# Patient Record
Sex: Female | Born: 1965
Health system: Southern US, Community
[De-identification: ages and names within clinical notes are randomized; demographics above are authoritative.]

## PROBLEM LIST (undated history)

## (undated) DIAGNOSIS — I10 Essential (primary) hypertension: Secondary | ICD-10-CM

## (undated) DIAGNOSIS — R42 Dizziness and giddiness: Secondary | ICD-10-CM

## (undated) DIAGNOSIS — F319 Bipolar disorder, unspecified: Secondary | ICD-10-CM

## (undated) DIAGNOSIS — M5440 Lumbago with sciatica, unspecified side: Secondary | ICD-10-CM

## (undated) DIAGNOSIS — M545 Low back pain, unspecified: Secondary | ICD-10-CM

## (undated) DIAGNOSIS — E876 Hypokalemia: Secondary | ICD-10-CM

## (undated) DIAGNOSIS — F419 Anxiety disorder, unspecified: Secondary | ICD-10-CM

## (undated) DIAGNOSIS — Z5189 Encounter for other specified aftercare: Secondary | ICD-10-CM

## (undated) DIAGNOSIS — M1990 Unspecified osteoarthritis, unspecified site: Secondary | ICD-10-CM

## (undated) HISTORY — DX: Bipolar disorder, unspecified: F31.9

## (undated) HISTORY — DX: Dizziness and giddiness: R42

## (undated) HISTORY — PX: ABDOMINAL HYSTERECTOMY: SHX81

## (undated) HISTORY — DX: Hypokalemia: E87.6

## (undated) HISTORY — DX: Essential (primary) hypertension: I10

## (undated) HISTORY — DX: Encounter for other specified aftercare: Z51.89

## (undated) HISTORY — DX: Low back pain, unspecified: M54.50

## (undated) HISTORY — DX: Lumbago with sciatica, unspecified side: M54.40

---

## 1998-03-28 ENCOUNTER — Inpatient Hospital Stay (HOSPITAL_COMMUNITY): Admission: AD | Admit: 1998-03-28 | Discharge: 1998-03-28 | Payer: Self-pay | Admitting: *Deleted

## 1999-11-27 ENCOUNTER — Inpatient Hospital Stay (HOSPITAL_COMMUNITY): Admission: RE | Admit: 1999-11-27 | Discharge: 1999-12-01 | Payer: Self-pay | Admitting: *Deleted

## 1999-11-27 ENCOUNTER — Encounter (INDEPENDENT_AMBULATORY_CARE_PROVIDER_SITE_OTHER): Payer: Self-pay

## 2000-06-16 ENCOUNTER — Emergency Department (HOSPITAL_COMMUNITY): Admission: EM | Admit: 2000-06-16 | Discharge: 2000-06-16 | Payer: Self-pay | Admitting: Emergency Medicine

## 2000-06-16 ENCOUNTER — Encounter: Payer: Self-pay | Admitting: Emergency Medicine

## 2001-06-24 ENCOUNTER — Other Ambulatory Visit: Admission: RE | Admit: 2001-06-24 | Discharge: 2001-06-24 | Payer: Self-pay | Admitting: Obstetrics & Gynecology

## 2008-08-03 ENCOUNTER — Observation Stay (HOSPITAL_COMMUNITY): Admission: AD | Admit: 2008-08-03 | Discharge: 2008-08-04 | Payer: Self-pay | Admitting: Family Medicine

## 2008-08-03 ENCOUNTER — Ambulatory Visit: Payer: Self-pay | Admitting: Family Medicine

## 2008-08-20 ENCOUNTER — Observation Stay (HOSPITAL_COMMUNITY): Admission: AD | Admit: 2008-08-20 | Discharge: 2008-08-22 | Payer: Self-pay | Admitting: Obstetrics and Gynecology

## 2008-09-05 ENCOUNTER — Inpatient Hospital Stay (HOSPITAL_COMMUNITY): Admission: RE | Admit: 2008-09-05 | Discharge: 2008-09-07 | Payer: Self-pay | Admitting: Obstetrics & Gynecology

## 2008-09-05 ENCOUNTER — Encounter (INDEPENDENT_AMBULATORY_CARE_PROVIDER_SITE_OTHER): Payer: Self-pay | Admitting: Obstetrics & Gynecology

## 2008-09-10 ENCOUNTER — Inpatient Hospital Stay (HOSPITAL_COMMUNITY): Admission: AD | Admit: 2008-09-10 | Discharge: 2008-09-14 | Payer: Self-pay | Admitting: Obstetrics and Gynecology

## 2008-09-12 ENCOUNTER — Encounter: Payer: Self-pay | Admitting: Obstetrics and Gynecology

## 2009-04-14 ENCOUNTER — Encounter: Admission: RE | Admit: 2009-04-14 | Discharge: 2009-04-14 | Payer: Self-pay | Admitting: Family Medicine

## 2009-05-10 ENCOUNTER — Encounter: Admission: RE | Admit: 2009-05-10 | Discharge: 2009-05-10 | Payer: Self-pay | Admitting: Family Medicine

## 2009-05-27 HISTORY — PX: EXPLORATORY LAPAROTOMY: SUR591

## 2010-04-12 IMAGING — CR DG LUMBAR SPINE COMPLETE 4+V
5 series · 5 of 5 positions shown · non-contrast
Comparison: CT 09/10/2008

CLINICAL DATA: Chronic back pain

LUMBAR SPINE - COMPLETE 4+ VIEW

[view not recorded (1 of 5)]
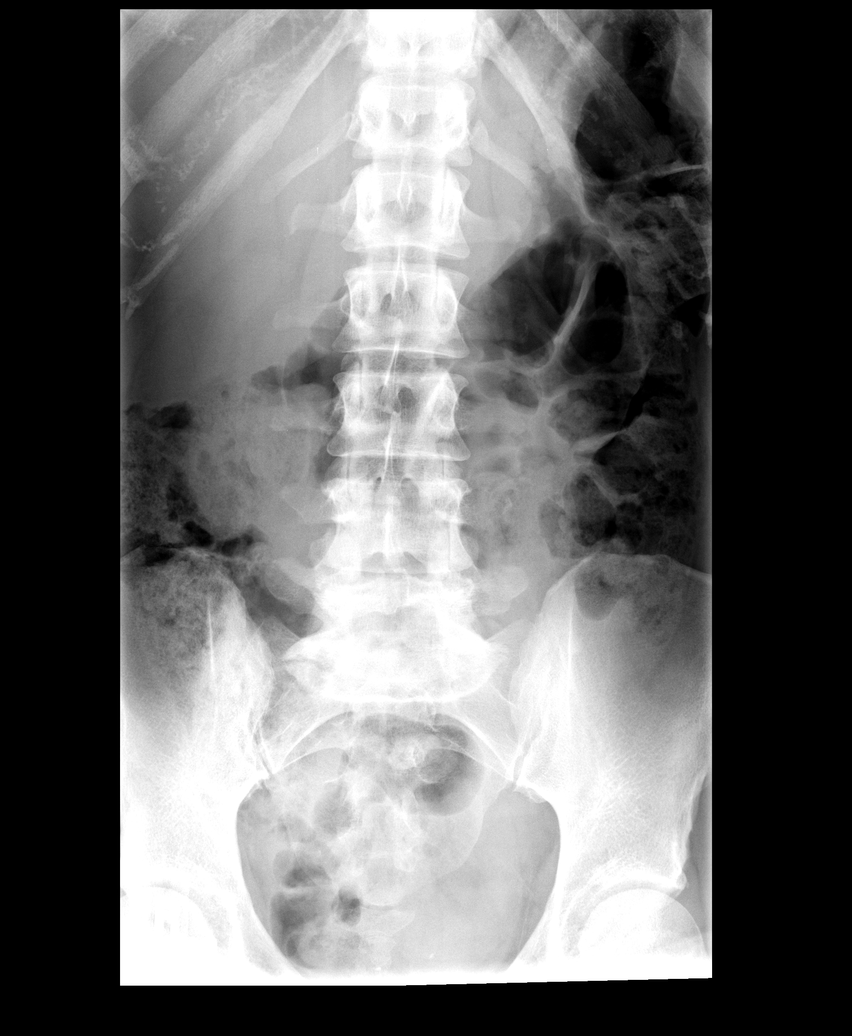

[view not recorded (2 of 5)]
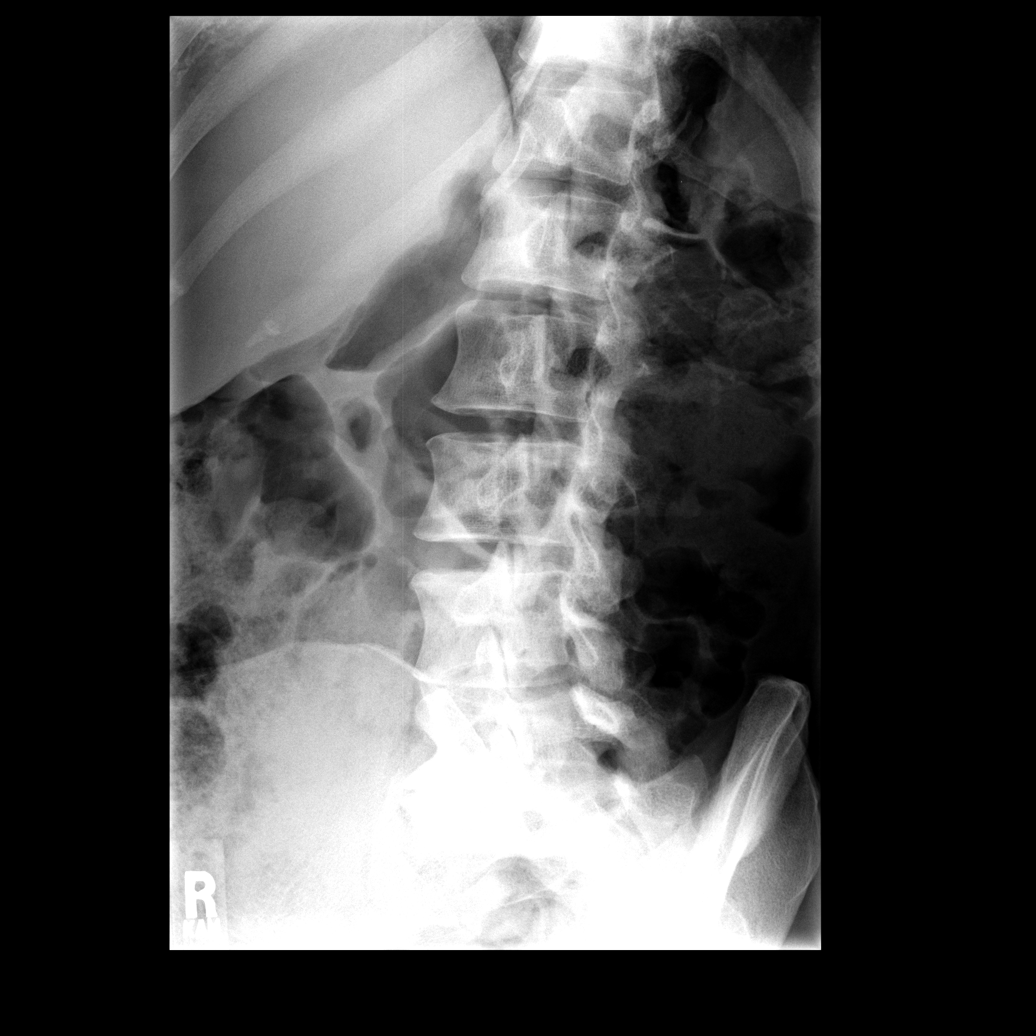

[view not recorded (3 of 5)]
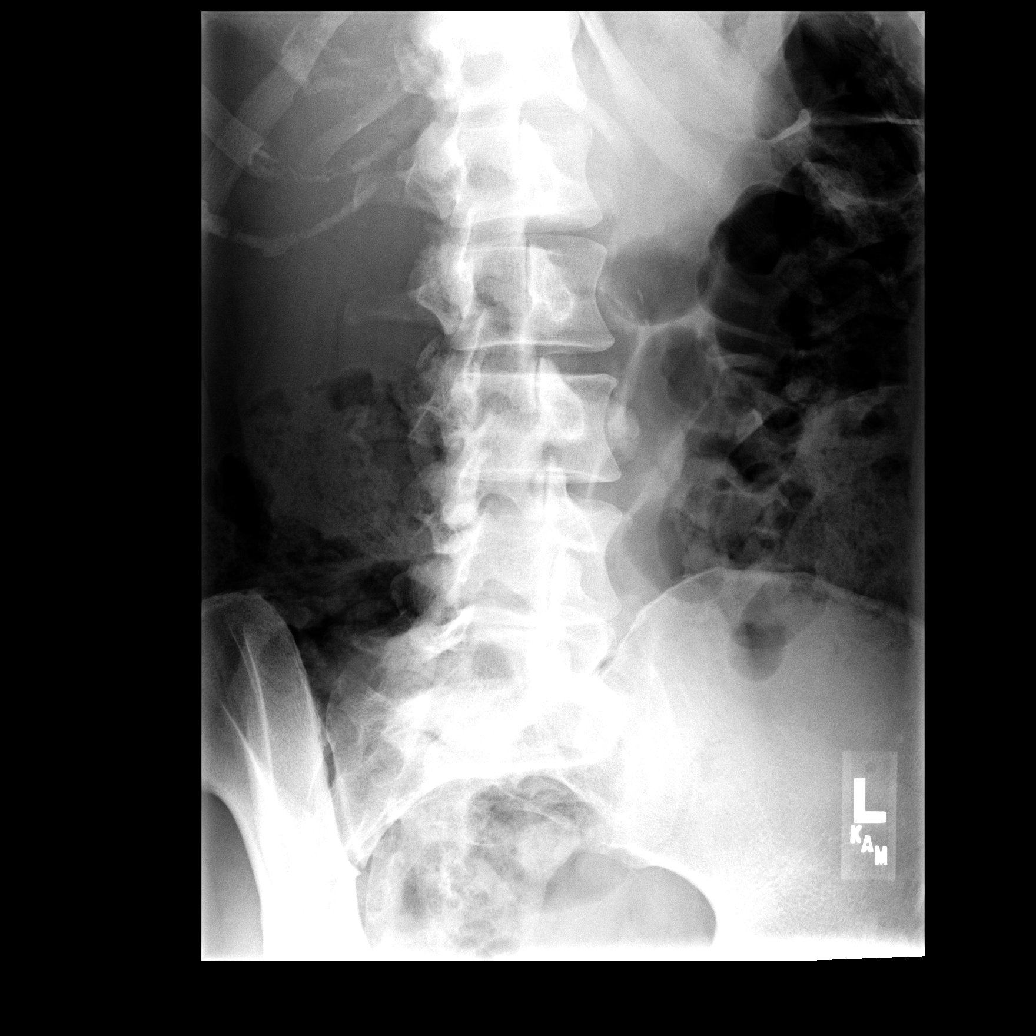

[view not recorded (4 of 5)]
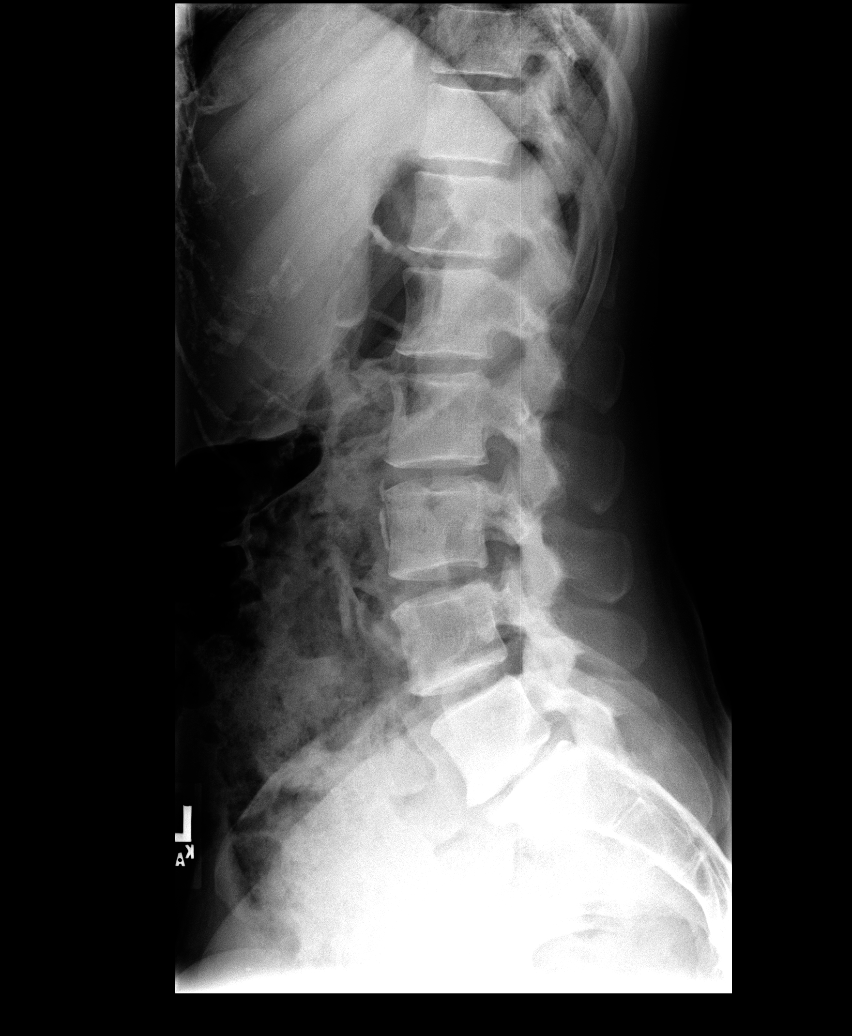

[view not recorded (5 of 5)]
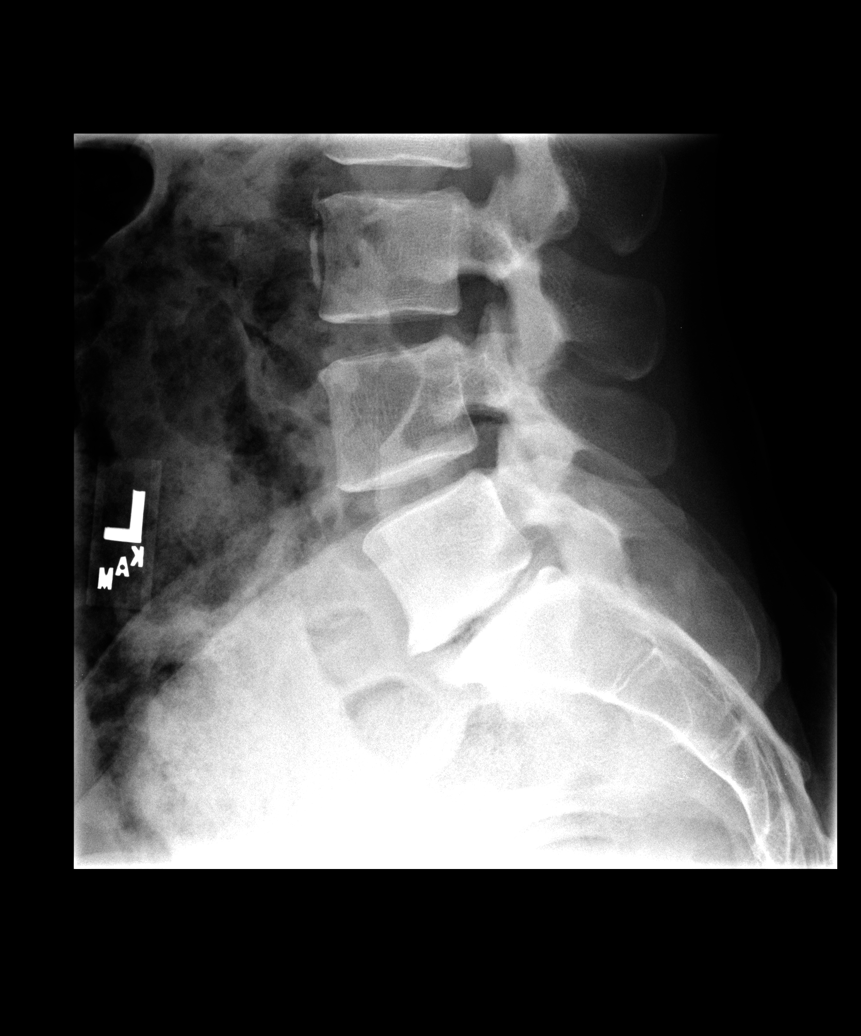

[5 of 5 positions shown; findings below may reference images not displayed]

FINDINGS: Five lumbar-type vertebral body.  Lateral projection
demonstrat mild loss of disc height at L5-S1 with associated
endplate sclerosis and osteophytosis.  No evidence of subluxation.
Oblique projection demonstrate no evidence of pars fracture.
IMPRESSION: 1.  No acute findings of the lumbar spine.
2.  Mild disc osteophytic disease at L5-S1.

## 2010-09-05 LAB — COMPREHENSIVE METABOLIC PANEL
ALT: 16 U/L (ref 0–35)
AST: 26 U/L (ref 0–37)
Albumin: 2.1 g/dL — ABNORMAL LOW (ref 3.5–5.2)
Alkaline Phosphatase: 77 U/L (ref 39–117)
BUN: 5 mg/dL — ABNORMAL LOW (ref 6–23)
CO2: 22 mEq/L (ref 19–32)
Calcium: 7.9 mg/dL — ABNORMAL LOW (ref 8.4–10.5)
Chloride: 101 mEq/L (ref 96–112)
Creatinine, Ser: 0.96 mg/dL (ref 0.4–1.2)
GFR calc Af Amer: >60 mL/min (ref >60)
GFR calc non Af Amer: >60 mL/min (ref >60)
Glucose, Bld: 214 mg/dL — ABNORMAL HIGH (ref 70–99)
Potassium: 2.9 mEq/L — ABNORMAL LOW (ref 3.5–5.1)
Sodium: 133 mEq/L — ABNORMAL LOW (ref 135–145)
Total Bilirubin: 0.8 mg/dL (ref 0.3–1.2)
Total Protein: 5.4 g/dL — ABNORMAL LOW (ref 6.0–8.3)

## 2010-09-05 LAB — BASIC METABOLIC PANEL
BUN: 3 mg/dL — ABNORMAL LOW (ref 6–23)
BUN: 4 mg/dL — ABNORMAL LOW (ref 6–23)
CO2: 28 mEq/L (ref 19–32)
CO2: 29 mEq/L (ref 19–32)
Calcium: 7.9 mg/dL — ABNORMAL LOW (ref 8.4–10.5)
Calcium: 8.3 mg/dL — ABNORMAL LOW (ref 8.4–10.5)
Chloride: 101 mEq/L (ref 96–112)
Chloride: 103 mEq/L (ref 96–112)
Creatinine, Ser: 0.68 mg/dL (ref 0.4–1.2)
Creatinine, Ser: 0.71 mg/dL (ref 0.4–1.2)
GFR calc Af Amer: >60 mL/min (ref >60)
GFR calc Af Amer: >60 mL/min (ref >60)
GFR calc non Af Amer: >60 mL/min (ref >60)
GFR calc non Af Amer: >60 mL/min (ref >60)
Glucose, Bld: 135 mg/dL — ABNORMAL HIGH (ref 70–99)
Glucose, Bld: 160 mg/dL — ABNORMAL HIGH (ref 70–99)
Potassium: 2.9 mEq/L — ABNORMAL LOW (ref 3.5–5.1)
Potassium: 3.3 mEq/L — ABNORMAL LOW (ref 3.5–5.1)
Sodium: 135 mEq/L (ref 135–145)
Sodium: 138 mEq/L (ref 135–145)

## 2010-09-05 LAB — CBC
HCT: 18.9 % — ABNORMAL LOW (ref 36.0–46.0)
HCT: 20.6 % — ABNORMAL LOW (ref 36.0–46.0)
HCT: 21.3 % — ABNORMAL LOW (ref 36.0–46.0)
HCT: 23.2 % — ABNORMAL LOW (ref 36.0–46.0)
HCT: 23.8 % — ABNORMAL LOW (ref 36.0–46.0)
HCT: 18.5 % — ABNORMAL LOW (ref 36.0–46.0)
HCT: 20.5 % — ABNORMAL LOW (ref 36.0–46.0)
HCT: 29.9 % — ABNORMAL LOW (ref 36.0–46.0)
Hemoglobin: 6.1 g/dL — CL (ref 12.0–15.0)
Hemoglobin: 6.7 g/dL — CL (ref 12.0–15.0)
Hemoglobin: 6.7 g/dL — CL (ref 12.0–15.0)
Hemoglobin: 7.5 g/dL — CL (ref 12.0–15.0)
Hemoglobin: 7.5 g/dL — CL (ref 12.0–15.0)
Hemoglobin: 5.9 g/dL — CL (ref 12.0–15.0)
Hemoglobin: 6.6 g/dL — CL (ref 12.0–15.0)
Hemoglobin: 9.2 g/dL — ABNORMAL LOW (ref 12.0–15.0)
MCHC: 31.5 g/dL (ref 30.0–36.0)
MCHC: 31.5 g/dL (ref 30.0–36.0)
MCHC: 32.1 g/dL (ref 30.0–36.0)
MCHC: 32.4 g/dL (ref 30.0–36.0)
MCHC: 32.4 g/dL (ref 30.0–36.0)
MCHC: 30.9 g/dL (ref 30.0–36.0)
MCHC: 31.9 g/dL (ref 30.0–36.0)
MCHC: 32 g/dL (ref 30.0–36.0)
MCV: 70.9 fL — ABNORMAL LOW (ref 78.0–100.0)
MCV: 71.5 fL — ABNORMAL LOW (ref 78.0–100.0)
MCV: 71.9 fL — ABNORMAL LOW (ref 78.0–100.0)
MCV: 73 fL — ABNORMAL LOW (ref 78.0–100.0)
MCV: 73.1 fL — ABNORMAL LOW (ref 78.0–100.0)
MCV: 72.6 fL — ABNORMAL LOW (ref 78.0–100.0)
MCV: 73.2 fL — ABNORMAL LOW (ref 78.0–100.0)
MCV: 74.1 fL — ABNORMAL LOW (ref 78.0–100.0)
Platelets: 320 10*3/uL (ref 150–400)
Platelets: 423 10*3/uL — ABNORMAL HIGH (ref 150–400)
Platelets: 508 10*3/uL — ABNORMAL HIGH (ref 150–400)
Platelets: 535 10*3/uL — ABNORMAL HIGH (ref 150–400)
Platelets: 641 10*3/uL — ABNORMAL HIGH (ref 150–400)
Platelets: 446 10*3/uL — ABNORMAL HIGH (ref 150–400)
Platelets: 466 10*3/uL — ABNORMAL HIGH (ref 150–400)
Platelets: 825 10*3/uL — ABNORMAL HIGH (ref 150–400)
RBC: 2.67 MIL/uL — ABNORMAL LOW (ref 3.87–5.11)
RBC: 2.82 MIL/uL — ABNORMAL LOW (ref 3.87–5.11)
RBC: 2.97 MIL/uL — ABNORMAL LOW (ref 3.87–5.11)
RBC: 3.17 MIL/uL — ABNORMAL LOW (ref 3.87–5.11)
RBC: 3.31 MIL/uL — ABNORMAL LOW (ref 3.87–5.11)
RBC: 2.56 MIL/uL — ABNORMAL LOW (ref 3.87–5.11)
RBC: 2.8 MIL/uL — ABNORMAL LOW (ref 3.87–5.11)
RBC: 4.03 MIL/uL (ref 3.87–5.11)
RDW: 25.3 % — ABNORMAL HIGH (ref 11.5–15.5)
RDW: 25.7 % — ABNORMAL HIGH (ref 11.5–15.5)
RDW: 25.9 % — ABNORMAL HIGH (ref 11.5–15.5)
RDW: 26.1 % — ABNORMAL HIGH (ref 11.5–15.5)
RDW: 26.1 % — ABNORMAL HIGH (ref 11.5–15.5)
RDW: 25.9 % — ABNORMAL HIGH (ref 11.5–15.5)
RDW: 26.3 % — ABNORMAL HIGH (ref 11.5–15.5)
RDW: 27.4 % — ABNORMAL HIGH (ref 11.5–15.5)
WBC: 16.3 10*3/uL — ABNORMAL HIGH (ref 4.0–10.5)
WBC: 16.3 10*3/uL — ABNORMAL HIGH (ref 4.0–10.5)
WBC: 16.7 10*3/uL — ABNORMAL HIGH (ref 4.0–10.5)
WBC: 18.5 10*3/uL — ABNORMAL HIGH (ref 4.0–10.5)
WBC: 9.1 10*3/uL (ref 4.0–10.5)
WBC: 11.3 10*3/uL — ABNORMAL HIGH (ref 4.0–10.5)
WBC: 12.6 10*3/uL — ABNORMAL HIGH (ref 4.0–10.5)
WBC: 16.1 10*3/uL — ABNORMAL HIGH (ref 4.0–10.5)

## 2010-09-05 LAB — URINALYSIS, ROUTINE W REFLEX MICROSCOPIC
Bilirubin Urine: NEGATIVE
Bilirubin Urine: NEGATIVE
Glucose, UA: 100 mg/dL — AB
Glucose, UA: NEGATIVE mg/dL
Glucose, UA: NEGATIVE mg/dL
Hgb urine dipstick: NEGATIVE
Ketones, ur: 15 mg/dL — AB
Ketones, ur: NEGATIVE mg/dL
Ketones, ur: NEGATIVE mg/dL
Leukocytes, UA: NEGATIVE
Leukocytes, UA: NEGATIVE
Nitrite: NEGATIVE
Nitrite: NEGATIVE
Nitrite: NEGATIVE
Protein, ur: 100 mg/dL — AB
Protein, ur: 30 mg/dL — AB
Protein, ur: NEGATIVE mg/dL
Specific Gravity, Urine: 1.015 (ref 1.005–1.030)
Specific Gravity, Urine: 1.02 (ref 1.005–1.030)
Specific Gravity, Urine: 1.025 (ref 1.005–1.030)
Urobilinogen, UA: 0.2 mg/dL (ref 0.0–1.0)
Urobilinogen, UA: 1 mg/dL (ref 0.0–1.0)
Urobilinogen, UA: 0.2 mg/dL (ref 0.0–1.0)
pH: 5.5 (ref 5.0–8.0)
pH: 7.5 (ref 5.0–8.0)
pH: 5.5 (ref 5.0–8.0)

## 2010-09-05 LAB — PROTIME-INR
INR: 1.4 (ref 0.00–1.49)
Prothrombin Time: 17.8 seconds — ABNORMAL HIGH (ref 11.6–15.2)

## 2010-09-05 LAB — URINE MICROSCOPIC-ADD ON

## 2010-09-05 LAB — CROSSMATCH
ABO/RH(D): O POS
Antibody Screen: NEGATIVE

## 2010-09-05 LAB — DIFFERENTIAL
Basophils Absolute: 0 10*3/uL (ref 0.0–0.1)
Basophils Relative: 0 % (ref 0–1)
Eosinophils Absolute: 0 10*3/uL (ref 0.0–0.7)
Eosinophils Relative: 0 % (ref 0–5)
Lymphocytes Relative: 4 % — ABNORMAL LOW (ref 12–46)
Lymphs Abs: 0.7 10*3/uL (ref 0.7–4.0)
Monocytes Absolute: 1.9 10*3/uL — ABNORMAL HIGH (ref 0.1–1.0)
Monocytes Relative: 10 % (ref 3–12)
Neutro Abs: 15.9 10*3/uL — ABNORMAL HIGH (ref 1.7–7.7)
Neutrophils Relative %: 86 % — ABNORMAL HIGH (ref 43–77)

## 2010-09-05 LAB — ABO/RH: ABO/RH(D): O POS

## 2010-09-05 LAB — TRANSFUSION REACTION
DAT C3: NEGATIVE
Post RXN DAT IgG: NEGATIVE

## 2010-09-05 LAB — ELECTROLYTE PANEL
CO2: 29 mEq/L (ref 19–32)
Chloride: 103 mEq/L (ref 96–112)
Potassium: 3.5 mEq/L (ref 3.5–5.1)
Sodium: 137 mEq/L (ref 135–145)

## 2010-09-05 LAB — CULTURE, ROUTINE-ABSCESS

## 2010-09-06 LAB — CBC
HCT: 21 % — ABNORMAL LOW (ref 36.0–46.0)
HCT: 21.8 % — ABNORMAL LOW (ref 36.0–46.0)
HCT: 24.5 % — ABNORMAL LOW (ref 36.0–46.0)
HCT: 16.1 % — ABNORMAL LOW (ref 36.0–46.0)
HCT: 28 % — ABNORMAL LOW (ref 36.0–46.0)
Hemoglobin: 6.7 g/dL — CL (ref 12.0–15.0)
Hemoglobin: 6.9 g/dL — CL (ref 12.0–15.0)
Hemoglobin: 7.9 g/dL — CL (ref 12.0–15.0)
Hemoglobin: 4.7 g/dL — CL (ref 12.0–15.0)
Hemoglobin: 8.9 g/dL — ABNORMAL LOW (ref 12.0–15.0)
MCHC: 31.8 g/dL (ref 30.0–36.0)
MCHC: 32.1 g/dL (ref 30.0–36.0)
MCHC: 32.1 g/dL (ref 30.0–36.0)
MCHC: 29.4 g/dL — ABNORMAL LOW (ref 30.0–36.0)
MCHC: 31.7 g/dL (ref 30.0–36.0)
MCV: 73.4 fL — ABNORMAL LOW (ref 78.0–100.0)
MCV: 74.2 fL — ABNORMAL LOW (ref 78.0–100.0)
MCV: 75.7 fL — ABNORMAL LOW (ref 78.0–100.0)
MCV: 57.6 fL — ABNORMAL LOW (ref 78.0–100.0)
MCV: 69.9 fL — ABNORMAL LOW (ref 78.0–100.0)
Platelets: 386 10*3/uL (ref 150–400)
Platelets: 417 10*3/uL — ABNORMAL HIGH (ref 150–400)
Platelets: 451 10*3/uL — ABNORMAL HIGH (ref 150–400)
Platelets: 280 10*3/uL (ref 150–400)
Platelets: 387 10*3/uL (ref 150–400)
RBC: 2.83 MIL/uL — ABNORMAL LOW (ref 3.87–5.11)
RBC: 2.88 MIL/uL — ABNORMAL LOW (ref 3.87–5.11)
RBC: 3.33 MIL/uL — ABNORMAL LOW (ref 3.87–5.11)
RBC: 2.79 MIL/uL — ABNORMAL LOW (ref 3.87–5.11)
RBC: 4 MIL/uL (ref 3.87–5.11)
RDW: 32.6 % — ABNORMAL HIGH (ref 11.5–15.5)
RDW: 33 % — ABNORMAL HIGH (ref 11.5–15.5)
RDW: 33.3 % — ABNORMAL HIGH (ref 11.5–15.5)
RDW: 22.5 % — ABNORMAL HIGH (ref 11.5–15.5)
RDW: 29.9 % — ABNORMAL HIGH (ref 11.5–15.5)
WBC: 10.7 10*3/uL — ABNORMAL HIGH (ref 4.0–10.5)
WBC: 11.5 10*3/uL — ABNORMAL HIGH (ref 4.0–10.5)
WBC: 13 10*3/uL — ABNORMAL HIGH (ref 4.0–10.5)
WBC: 4.2 10*3/uL (ref 4.0–10.5)
WBC: 6.1 10*3/uL (ref 4.0–10.5)

## 2010-09-06 LAB — DIFFERENTIAL
Basophils Absolute: 0.1 10*3/uL (ref 0.0–0.1)
Basophils Relative: 1 % (ref 0–1)
Eosinophils Absolute: 0.1 10*3/uL (ref 0.0–0.7)
Eosinophils Relative: 1 % (ref 0–5)
Lymphocytes Relative: 17 % (ref 12–46)
Lymphs Abs: 1 10*3/uL (ref 0.7–4.0)
Monocytes Absolute: 0.5 10*3/uL (ref 0.1–1.0)
Monocytes Relative: 8 % (ref 3–12)
Neutro Abs: 4.4 10*3/uL (ref 1.7–7.7)
Neutrophils Relative %: 73 % (ref 43–77)

## 2010-09-06 LAB — BASIC METABOLIC PANEL
BUN: 8 mg/dL (ref 6–23)
CO2: 26 mEq/L (ref 19–32)
Calcium: 8.9 mg/dL (ref 8.4–10.5)
Chloride: 109 mEq/L (ref 96–112)
Creatinine, Ser: 0.7 mg/dL (ref 0.4–1.2)
GFR calc Af Amer: >60 mL/min (ref >60)
GFR calc non Af Amer: >60 mL/min (ref >60)
Glucose, Bld: 138 mg/dL — ABNORMAL HIGH (ref 70–99)
Potassium: 3.5 mEq/L (ref 3.5–5.1)
Sodium: 140 mEq/L (ref 135–145)

## 2010-09-06 LAB — CROSSMATCH
ABO/RH(D): O POS
Antibody Screen: NEGATIVE

## 2010-09-06 LAB — IRON AND TIBC
Iron: <10 ug/dL — ABNORMAL LOW (ref 42–135)
UIBC: 456 ug/dL

## 2010-09-06 LAB — ABO/RH: ABO/RH(D): O POS

## 2010-09-06 LAB — SAMPLE TO BLOOD BANK

## 2010-10-09 NOTE — H&P (Signed)
Victoria Snyder, Victoria Snyder               ACCOUNT NO.:  [id number]   MEDICAL RECORD NO.:  [id number]         PATIENT TYPE:  WAMB   LOCATION:                                [facility]ILITY[facility]  [facility]   PHYSICIAN:  Freddy Finner, M.D.   DATE OF BIRTH:  February 27, 1966   DATE OF ADMISSION:  09/05/2008  DATE OF DISCHARGE:                              HISTORY & PHYSICAL   ADMISSION DIAGNOSES:  Large multiple uterine leiomyomata, profound  menorrhagia and secondary anemia.   The patient is a 45 year old black married female -- gravid 5, para 1;  who was last in this office in 2007, at which time the uterus was  thought to be approximately [redacted] weeks gestational size.  She was lost to  follow-up until March 2010, when she presented to an Urgent Care  facility with heavy vaginal bleeding and clots.  She was sent to [facility] where she had a 4-unit blood transfusion.   On examination here in the office, the uterus was considered to be  approximately 18-[redacted] weeks gestational size.  Ultrasound confirms  numerous large myomas, the largest of which was approximately 7.3 cm.  She required hospitalization approximately 2 weeks prior to admission  for IV Premarin to control bleeding.  She has been maintained with oral  hormonal therapy to control the bleeding to the present point.  Her  hemoglobin in the office on the day of her preop evaluation was 8.4.  She is now admitted for total abdominal hysterectomy and bilateral  salpingo-oophorectomy only if ovaries are abnormal.   Her current review of systems is otherwise negative.  She has no  cardiopulmonary, GI or GU complaints.  She does have weakness, and this  is appropriate for her hemoglobin.   PAST MEDICAL HISTORY:  The patient has no known significant medical  illnesses.  She does have a history of migraine headache.  Her only  current medications are iron, oral contraceptives and Vicodin for  cramping pain.  She has had a blood transfusion, as noted  above.  She  has given birth to one child vaginally.  She did previously have an  ectopic pregnancy and 3 spontaneous abortions.  She is not a cigarette  smoker.   FAMILY HISTORY:  Noncontributory.   PHYSICAL EXAMINATION:  HEENT:  Grossly within normal limits.  Blood pressure in the office 110/60.  Thyroid gland is not palpably enlarged.  CHEST:  Clear to auscultation throughout.  HEART:  Early systolic murmur, consistent with flow murmur.  Regular  sinus rhythm.  BREASTS:  Exam is considered to be normal.  No palpable masses.  No skin  changes or nipple discharge.  ABDOMEN:  Benign, except for presence of the uterus, which is  approximately 2-3 cm below the umbilicus.  PELVIC EXAMINATION:  External genitalia, vagina and cervix are normal to  inspection.  Bimanual reveals the uterus to be filled in the pelvis and  extend to the approximately 2 cm below the level of the umbilicus.  There were no palpable rectal lesions.  The rectovaginal exam confirms  the above findings.  The recent Pap test was normal.   ASSESSMENT:  Large uterine leiomyomata, secondary menometrorrhagia and  anemia.   PLAN:  Total abdominal hysterectomy; bilateral salpingo-oophorectomy  only if the ovaries are abnormal.  The risks of the procedure, including  hemorrhage, infection and injury to other organs has been discussed with  the patient.  She is now admitted and prepared to proceed with surgery.  She has been given the ACOG brochure on total abdominal hysterectomy.      Freddy Finner, M.D.  Electronically Signed     WRN/MEDQ  D:  09/01/2008  T:  09/01/2008  Job:  [id number]

## 2010-10-09 NOTE — H&P (Signed)
Victoria Snyder, Victoria Snyder               ACCOUNT NO.:  [id number]   MEDICAL RECORD NO.:  [id number]          PATIENT TYPE:  INP   LOCATION:  6735                         FACILITY:  [facility]   PHYSICIAN:  Pearlean Brownie, M.D.DATE OF BIRTH:  1965/06/24   DATE OF ADMISSION:  08/03/2008  DATE OF DISCHARGE:                              HISTORY & PHYSICAL   REASON FOR ADMISSION:  Anemia.   PRIMARY CARE PHYSICIAN:  Dr. Kevan Ny at [facility], but she has not  seen her in several years who presents to [place] through [facility].   HISTORY OF PRESENT ILLNESS:  This is a 45 year old African American  female who went to work today and just did not feel well.  Decided she  would ED checked out, thus she went to [address] [facility].  There, she was noted to be pale and reported having started a  recent menstrual cycle this past Sunday with multiple large blood clots.  Thus subsequently we checked a hemoglobin and was found to be 4.9 and  thus it was called for admission.   PAST MEDICAL HISTORY:  1. She has a history of thyroid and reportedly has a 14-week fibroid      uterus at this time.  Her typical menstrual cycle is 7 days and      consists of heavy blood clots.  2. Low back pain which is thought to be secondary to her fibroid      uterus.  3. Constipation.  4. History of ectopic pregnancies.   SURGICAL HISTORY:  Myomectomy and a questionable right fallopian tube  removal as a result of ectopic pregnancies.   SOCIAL HISTORY:  The patient currently lives at home with her husband  and 2 sons, though she has 1 biological child and 3 stepchildren.  She  works at a [employer] and is married.  She quit tobacco  approximately  8 years ago and prior to that just smoked socially.  She  currently drinks about 1 glass of wine nightly on the weekends.  She  used to exercise regularly though has had to quit due to fatigue.  She  denies any drug  use.   FAMILY HISTORY:  Mother had hypertension and gout and died of a massive  MI.  Father is deceased from multiple CVAs.  Siblings, she has 8 sisters  and 6 brothers, one of which may have had a CVA and one of which may  have had an MI, but otherwise, they have been healthy per the patient's  report.   Review of systems is positive for fatigue, headache, rhinorrhea,  polyuria, and occasional leg pain as well as constipation, but  otherwise, is negative on greater than 12 points.   CURRENT MEDICATIONS:  Multivitamin/mineral member, papaya pills, MS and  supplement, EDTA supplement, Suprema Dophilus supplement.   ALLERGIES:  No known drug allergies.   PHYSICAL EXAMINATION:  GENERAL:  This is a pleasant, thin African  American female in no apparent distress.  HEENT:  Normocephalic, atraumatic with pupils equal, round, and reactive  to  light.  Conjunctivae pale.  Oropharynx, pink and moist.  Good  dentition.  NECK:  Supple.  CARDIOVASCULAR:  Slow murmur but is regular rate and rhythm.  LUNGS:  Clear to auscultation bilaterally.  ABDOMEN:  Soft, nontender, nondistended with positive bowel sounds and  no hepatosplenomegaly appreciated.  BACK:  No pain.  GU AND RECTAL:  Deferred at this time.  EXTREMITIES:  No edema and pale nailbeds.  NEURO:  The patient is alert and oriented x3.   LABORATORY DATA:  Hemoglobin at [facility] as reported to be 4.9, we are  repeating this in the hospitalization.   ASSESSMENT:  This is a 45 year old female with anemia secondary to  fibroids.   PLAN:  1. Anemia.  We will type and cross 4 units of packed red blood cells,      as she is quite low at this time.  Prior to transfusions, we will      check a CBC and iron studies.  Again, her anemia is suspected to be      secondary to a fibroid though she has no other sources of bleeding      including no rectal bleeding and no urinary bleeding.  We will      repeat a CBC posttransfusion.  She does have  followup already      scheduled with [facility] on August 12, 2008, to discuss her fibroids and bleeding.  2. FEN GI.  We will provide a regular diet, provide Colace and MiraLax      for constipation, and there is no need for IV fluids at this time      since she is getting blood.  3. Prophylaxis.  Ambulation, as she has actively fibroids and while      she is in bed, she will have SCDs to the knees.  4. Disposition is pending improvement.  Again, she has followup with      the [facility] on August 12, 2008, and for      her primary care physician.  We would like to go back to Dr. Kevan Ny      at [facility].      Ancil Boozer, MD  Electronically Signed      Pearlean Brownie, M.D.  Electronically Signed    SA/MEDQ  D:  08/03/2008  T:  08/04/2008  Job:  [id number]

## 2010-10-09 NOTE — Discharge Summary (Signed)
NAMEVIHANA, KYDD               ACCOUNT NO.:  [id number]   MEDICAL RECORD NO.:  [id number]          PATIENT TYPE:  OBV   LOCATION:  6735                         FACILITY:  [facility]   PHYSICIAN:  Santiago Bumpers. Hensel, M.D.DATE OF BIRTH:  08/26/1965   DATE OF ADMISSION:  08/03/2008  DATE OF DISCHARGE:  08/04/2008                               DISCHARGE SUMMARY   ATTENDING PHYSICIAN:  Chrissie Noa A. Hensel, MD   PRIMARY CARE Aldea Avis:  The patient to establish with Dr. Jetty Peeks  Family Physician.   DISCHARGE DIAGNOSES:  1. Menorrhagia.  2. Fibroid disease.  3. Iron-deficiency anemia.  4. Constipation.   DISCHARGE MEDICATIONS:  1. Iron sulfate 325 mg p.o. b.i.d.  2. Colace 100 mg p.o. b.i.d. p.r.n. constipation.  3. Provera 10 mg p.o. daily x10 days.   DISCONTINUE MEDICATIONS:  None.   CONSULTANT:  None.   PROCEDURE:  Blood transfusion 4 units packed red blood cells.   LABORATORY DATA:  Admission hemoglobin 4.7, admission hematocrit 16.1,  platelets 387.  Discharge labs; hemoglobin 8.9, hematocrit 28.0,  platelets 280, total iron less than 10, MCV 69.9.  BMET; sodium 140,  potassium 3.5, BUN 8, creatinine 0.70.   BRIEF HOSPITAL COURSE:  A 45 year old African American female with  history of fibroid disease, status post myomectomy in 2002, presented  with acute blood loss and anemia.  The patient previously evaluated in  [facility] and is found to have a  hemoglobin of 4.9.  The patient was admitted for transfusion.  1. Menorrhagia.  The patient with history of fibroid disorder, most      likely causing low hemoglobin found on admission.  The patient also      on menstrual cycle, which has lasted greater than 10 days.  The      patient was transfused 4 units of packed red blood cells after      proper type and cross match.  Hemoglobin responded well to the      transfusion with an increase from 4.7 on admission to 8.9 at      discharge.  Prior to  discharge, the patient was asymptomatic from      aNemia, which is most likely chronic in nature with history of      fibroid disease.  The patient was able to ambulate without any      significant symptoms of dizziness, headache, or chest pain.  The      patient will need to follow up as scheduled with Gynecology for      further evaluation of fibroids, which may include repeat      myomectomy, hysterectomy, or uterine artery embolization.      Secondary to the patient's continued bleeding greater than 10 days      as previously has normal monthly cycles lasting 5-7 days, we will      give short course of Provera x10 days until the patient evaluated      by GYN.  2. Iron-deficiency anemia.  Upon admission, hemoglobin found to be      fairly low at 4.9; however,  the patient with history of      menorrhagia, therefore unsure if this is acute or chronic anemia.      Iron studies were done, which showed a total iron less than 10,      unknown baseline hemoglobin for the patient as she has not      previously been admitted to [facility] within the last 8      years.  The patient was started on p.o. iron supplementation,      status post transfusion and will continue.  The patient is to      follow with Gynecology as stated above.  If the patient fails p.o.      iron intake after seen by Gynecology in followup on hemoglobin      studies, may need IV iron.  3. Constipation.  The patient with history of constipation uses herbal      supplements and increased fiber in diet at home.  The patient was      given Colace p.r.n.  As now, we will start iron tablets, which may      worsen constipation.   DISCHARGE INSTRUCTIONS:  The patient is to follow up as scheduled with  GYN for evaluation.  The patient is to return if she develops abdominal  pain or large amounts of bleeding.   FOLLOWUP APPOINTMENTS:  [facility] on August 12, 2008, as scheduled, Dr. Kevan Ny, [facility], the patient is to  call for appointment.   DISCHARGE CONDITION:  Stable.   DISCHARGE LOCATION:  Home.   DIET:  Regular.      Milinda Antis, MD  Electronically Signed      Santiago Bumpers. Leveda Anna, M.D.  Electronically Signed    KD/MEDQ  D:  08/04/2008  T:  08/05/2008  Job:  [id number]   cc:   [facility]  Dr. Kevan Ny

## 2010-10-09 NOTE — Op Note (Signed)
Victoria Snyder, Victoria Snyder               ACCOUNT NO.:  [id number]   MEDICAL RECORD NO.:  [id number]          PATIENT TYPE:  INP   LOCATION:  9302                          FACILITY[facility]  [facility]   PHYSICIAN:  Freddy Finner, M.D.   DATE OF BIRTH:  July 21, 1965   DATE OF PROCEDURE:  09/05/2008  DATE OF DISCHARGE:                               OPERATIVE REPORT   PREOPERATIVE DIAGNOSIS:  Very large uterine leiomyomata, secondary  menometrorrhagia with marked secondary anemia requiring hospitalization  for control of bleeding approximately 2 weeks before procedure,  persistent anemia of 9.2 was noted on admission.   POSTOPERATIVE DIAGNOSIS:  Very large uterine leiomyomata, secondary  menometrorrhagia with marked secondary anemia requiring hospitalization  for control of bleeding approximately 2 weeks before procedure,  persistent anemia of 9.2 was noted on admission, with omental adhesions  to the large fibroid mass which filled the pelvis and extended to a  level approximately 3 cm above the umbilicus.   OPERATIVE PROCEDURE:  TAH.   SURGEON:  Freddy Finner, M.D.   ASSISTANT:  Guy Sandifer. Henderson Cloud, M.D.   ANESTHESIA:  General endotracheal.   ESTIMATED INTRAOPERATIVE BLOOD LOSS:  400 mL.   INTRAOPERATIVE COMPLICATIONS:  None.   Details of present illness recorded in the admission note.  The patient  was admitted on the morning of surgery brought to the operating room.  There placed under adequate general endotracheal anesthesia.  She did  receive a gram of Ancef preoperatively.  She was placed in PAS hose.  The abdomen was prepped and draped in the usual fashion using Betadine  scrub followed by solution.  Vaginal prep was carried out with Betadine  and Foley catheter was placed using sterile technique.  Sterile drapes  were applied.  A wide Pfannenstiel incision was then made through an old  lower abdominal transverse scar which apparently was used for previous  cesarean delivery.  The dissection  was carried down to fascia.  Subcutaneous vessels were controlled with the Bovie.  The fascia was  entered sharply, extended to the extent of skin incision.  Rectus sheath  was developed superiorly and inferiorly with blunt and sharp dissection.  The peritoneum was entered sharply.  The peritoneal incision was  extended sharply and bluntly to the extent of skin incision.  Careful  manual exploration of the pelvis and lower abdomen was carried out  identifying numerous large myomas with the largest in the lower segment  posteriorly and filling the pelvis.  There was an especially significant  fibroid anteriorly on the lower segment to the left just above the  uterine arteries.  There were several other myomas.  There were omental  adhesions to the mass which were carefully lysed and fulgurated where  indicated.  The left broad ligament and adnexa were more immediately  accessible and initial attempt to elevate the uterus out of the pelvis  was unsuccessful.  For this reason the uterus was retracted to the right  and a Kelly clamp placed across the proximal broad ligament, fallopian  tube and utero-ovarian ligament.  With the LigaSure device progressive  pedicles  were then developed including the utero-ovarian, the upper  broad ligament and round ligament.  Progressive bites were carried down  to what was thought to be the uterine artery on the left.  Attention was  then turned to the right side.  Similarly the right side was dissected  using the LigaSure device.  There were some filmy tubo-ovarian adhesions  on the left anchoring the tube to the uterus and these had to be lysed  initially.  Again the dissection was carried down to the general area of  the uterine artery.  A further attempt was then made to elevate the  uterus and after a myomectomy of an approximately 3.5 cm lesion on the  anterior uterus, we were able to deliver the uterus out through the  abdominal incision.  This allowed  careful visualization of the bladder  which was released and bluntly and sharply dissected off the cervix and  lower segment.  Additional bites were taken on each side using LigaSure  to seal the vessels.  The cervix was then amputated from the uterus.  It  was grasped with thyroid tenaculum.  At this point it was determined  that the uterosacrals were available and they were crossclamped with  Haneys, divided sharply and ligated with suture ligature of 0 Monocryl.  Cervix was then completely excised.  The cuff was closed vertically  using figure-of-eights of 0 Monocryl.  Copious irrigation was then  carried out.  Hemostasis was complete.  Manual exploration of the upper  abdomen was carried out revealing a filmy adhesion to the liver.  No  palpable enlargement or stones in the gallbladder.  The kidneys were  palpably normal.  No other palpable abnormalities were noted in the  abdomen.  The appendix was completely retrocecal but was visibly normal  at its proximal end and palpably normal its length and it was left in  place.  Again irrigation was carried out.  All pack, needle and  instrument counts were correct.  Abdominal incision was then closed in  layers.  Running 0 Monocryl was used to close the peritoneum and  reapproximate the rectus muscles.  Fascia was closed with a running  looped 0 PDS.  Subcutaneous tissue was approximated with a running 2-0  plain.  Skin was closed with wide skin staples and quarter inch Steri-  Strips.  Estimated blood loss was perhaps in the neighborhood of 300-400  mL.  The patient was awakened.  She was in good condition and was taken  to recovery.      Freddy Finner, M.D.  Electronically Signed     WRN/MEDQ  D:  09/05/2008  T:  09/05/2008  Job:  [id number]

## 2010-10-09 NOTE — Discharge Summary (Signed)
Victoria Snyder, Victoria Snyder               ACCOUNT NO.:  [id number]   MEDICAL RECORD NO.:  [id number]          PATIENT TYPE:  INP   LOCATION:  9302                          [facility]   PHYSICIAN:  Freddy Finner, M.D.   DATE OF BIRTH:  [birth year]   DATE OF ADMISSION:  09/05/2008  DATE OF DISCHARGE:  09/07/2008                               DISCHARGE SUMMARY   DISCHARGE DIAGNOSES:  1. Multiple large uterine leiomyomata.  2. Secondary menometrorrhagia with severe anemia.   OPERATIVE PROCEDURE:  Lysis of pelvic adhesions, total abdominal  hysterectomy, uterine weight growth 900 g.   INTRAOPERATIVE/POSTOPERATIVE COMPLICATIONS:  None.   DISPOSITION:  The patient is in satisfactory improved condition on the  second postoperative day.  Her hemoglobin was 5.9, consistent with blood  loss at surgery and higher than her minimum hemoglobin prior to surgery  during her bleeding episodes.  Admission hemoglobin was 9.2.  She is  discharged on regular diet.  She is to take Percocet as needed for pain.  She is to use ibuprofen or Tylenol for less severe pain.  She is to call  for fever, severe pain, or heavy bleeding.  She is to avoid vaginal  entry or heavy lifting.  She is to take a regular diet.  She is to  continue her iron supplement.  She is to return to the office in 5 days  for staple removal.   Details of the present illness, past history, family history, review of  systems, physical exam were recorded in the admission note.  The patient  was admitted on the morning of surgery she was given a gram of Ancef.  She was placed in PAS hose.  She was brought to the operating room where  the above-described procedure was accomplished.  There were no  significant intraoperative complications.  Her postoperative course was  uncomplicated.  She remained afebrile throughout her hospital stay.  By  the morning of the second postoperative day, she was ambulating without  assistance.  She was  tolerating a regular diet.  She was having adequate  bowel and bladder function.  Her condition was considered to be good,  and she was discharged home with disposition as noted above.      Freddy Finner, M.D.  Electronically Signed     WRN/MEDQ  D:  09/07/2008  T:  09/08/2008  Job:  [id number]

## 2010-10-12 NOTE — Discharge Summary (Signed)
[facility]  Patient:    Victoria Snyder                   MRN: [id number] Adm. Date:  [id number] Disc. Date: [id number] Attending:  Deniece Ree                           Discharge Summary  DISCHARGE DIAGNOSIS:          Leiomyomata uteri.  OPERATION:                    Myomectomy.  SUMMARY:                      The patient is a 45 year old gravida 3, para 1 with two previous ectopic pregnancies.  The patient was admitted for myomectomy.  At the time, her uterus was approximately 16-18 weeks in size, and I explained that the patient could possible have a hysterectomy, which she understood.  On the day of admission, the patient underwent a myomectomy, which she tolerated very well without any problems.  Postoperatively, she did well until approximately the second day postoperatively, at which time she was noted to be having quite a bit of dizziness and light headedness.  The patient also at that time had difficulty standing and was then transfused four units of packed red cells.  Her hemoglobin prior to that time was 6.5.  After two units, it was 7.6.  After the fourth transfusion, she had a hemoglobin of 9.6.  The patient did not have any other problems.  She had developed a fever during this period of time.  However, it was thought to be possible due to blood transfusion.  She had been on an antibiotic, and was discharged on ampicillin to be followed up in my office.  She was instructed on the possible complications and care following this type of surgery.  She was told to return to my office in four weeks for follow up evaluation or to call me prior to that time should any problems arise. DD:  12/19/99 TD:  12/21/99 Job: [id number]

## 2010-10-12 NOTE — Op Note (Signed)
[facility]  Patient:    Victoria Snyder                   MRN: [id number] Proc. Date: 11/27/99 Adm. Date:  [id number] Attending:  Deniece Ree                           Operative Report  PREOPERATIVE DIAGNOSIS:       Leiomyomata uteri, approximately 27- to 18-weeks-size.  POSTOPERATIVE DIAGNOSIS:      Leiomyomata uteri, approximately 70- to 18-weeks-size, pending pathology.  OPERATION:                    Exploratory laparotomy with myomectomy.  SURGEON:                      Deniece Ree, M.D.  ASSISTANT:                    Kathreen Cosier, M.D.  ANESTHESIA:                   General.  ESTIMATED BLOOD LOSS:         350 cc.  DRAINS:                       A Foley was left to straight drainage.  DISPOSITION:                  Patient tolerated the procedure well and returned to the recovery room in satisfactory condition.  DESCRIPTION OF PROCEDURE:     Patient was taken to the operating room and prepped and draped in usual fashion for abdominal surgery.  A low Pfannenstiel incision was made.  This was carried down to the fascia, at which time the fascia was entered and incised the extent of the incision.  The midline was identified and rectus muscle separated.  The abdominal peritoneum was then entered in a vertical fashion using Metzenbaum scissors.  At this point, the uterus was palpated, elevated and noted to be approximately 16 weeks in size with numerous myomas present.  After identifying all of the anatomy, which indicated the tubes and ovaries appeared to be within normal limits, at this point -- a very tedious and long process -- the myomas were then dissected from the myometrium with the exception of one that protruded all the way through the endometrial cavity, which was entered.  After this was completed, removing 16 to 20 myomas, the process of closing the cavities was then present.  First, the endometrial cavity was  then closed using #1 chromic in figure-of-eight stitches until the uterus ______ , protruding through the endometrial cavity.  The areas that had lost the myomas, the portion was then filled by closing the cavities, also again using #1 chromic in interrupted figure-of-eight stitches.  This was continued over the entire uterus, with another layer closing the myometrium of the uterus, then the outer part of the uterus was closed again using #1 chromic in interrupted figure-of-eight stitches.  Hemostasis was present at the conclusion.  Interceed was then used, placed over the wound areas and procedure then terminated.  Hemostasis was present.  Sponge and needle count was correct x 2.  The abdominal peritoneum was then closed using 2-0 chromic in a running stitch, followed by closure of the fascia using #1 Dexon in a running stitch.  The skin was  closed with skin staples; the procedure terminated.  The patient tolerated the procedure well and returned to the recovery room in satisfactory condition. DD:  11/27/99 TD:  11/27/99 Job: [id number]

## 2010-10-12 NOTE — Discharge Summary (Signed)
Victoria Snyder, Victoria Snyder               ACCOUNT NO.:  [id number]   MEDICAL RECORD NO.:  [id number]          PATIENT TYPE:  INP   LOCATION:  9306                          FACILITY:  [facility]   PHYSICIAN:  Freddy Finner, M.D.   DATE OF BIRTH:  1965/08/19   DATE OF ADMISSION:  09/10/2008  DATE OF DISCHARGE:  09/14/2008                               DISCHARGE SUMMARY   DISCHARGE DIAGNOSES:  Infected pelvic hematoma following total abdominal  hysterectomy, lysis of pelvic adhesions.   PROCEDURES DURING THIS ADMISSION:  Placement of a transcutaneous drain  by the interventional radiologist for drainage of the infected pelvic  hematoma, response to this was excellent.  The patient is markedly  improved at time of her discharge.  She is 9 days after hysterectomy at  the time of discharge and she is to resume her normal hysterectomy  guidelines.  She is also to call for fever, recurrent pelvic pain, or  heavy bleeding from incisions or from the vagina.  She is to return to  the office in 1 week for followup.  She is to take a regular diet.  She  is to take Augmentin 875 mg b.i.d. for another 5 days.  She was given  Percocet 5/325 to be taken 1 or 2 every 4 hours as needed for  postoperative pain.   The patient was admitted by Dr. Marcelle Overlie in my absence.  Her examination  on admission was remarkable for abdominal pain and tenderness and for a  markedly elevated temperature of 101.   Laboratory data during this admission includes a CBC on admission with  hemoglobin of 7.5, WBC of 18.5 with a marked left shift, platelet count  of 641,000.  Admission, Chem-6 showed slightly low potassium at 3.3.  Prothrombin time was elevated to 17.8.  INR was 1.4.  CMET, the albumin  was 2.1, total protein 5.4, and calcium was 8.3.  Urinalysis showed many  bacteria but possibly not a clean-catch, negative for leukocyte  esterase.  No culture reports have been obtained from the culture of the  infected hematoma, serial  CBCs were obtained, and following admission  with a hemoglobin of 6.7, 2 units of transfusions was started which had  to be abandoned actually it fell to 6.1 after 1 unit was transfused  because of suspicious reaction blood work did not confirm a hemolytic  reaction.   HOSPITAL COURSE:  Following admission, the patient was started on  Unasyn.  On the day following admission, she had a CT of the abdomen and  pelvis with a finding of a large dilated mass consistent with a pelvic  abscess.  On the following day, she had transcutaneous drain placed into  the mass which was left until the day of her discharge.  During the  course of her hospitalization, her temperature rose as high as 103.9 but  she rapidly defervesced after drainage and IV antibiotics.  Her  discharge day, her white count was 9.1 hemoglobin was 6.7, and platelet  count was 320,000.  Her abdomen was soft with minimal tenderness.  The  drain was removed  and the site bandaged.  She was ambulating without  assistance having adequate bowel and bladder function, tolerating a  regular diet at the time of her discharge.  Further disposition as noted  above.      Freddy Finner, M.D.  Electronically Signed    WRN/MEDQ  D:  10/20/2008  T:  10/21/2008  Job:  [id number]

## 2010-10-12 NOTE — H&P (Signed)
[facility]  Patient:    Victoria Snyder                   MRN: [id number] Adm. Date:  [id number] Attending:  Deniece Ree                         History and Physical  HISTORY:                      Patient is a 45 year old gravida 3, para 1, with two ectopic pregnancies, who is being admitted for an exploratory laparotomy for myomatous uterus.  The patient is very much aware of the size of her uterus due to myomas, which is approximately 16- to 18-weeks-size; she is also aware of the possibility of having to do an abdominal hysterectomy.  She states her desire for possible future pregnancies and understands that I will attempt a myomectomy if possible.  PAST MEDICAL HISTORY AND REVIEW OF SYSTEMS:  Contributory in the fact that the patient has had two ectopic pregnancies in the past and she has also had one normal vaginal delivery.  PHYSICAL EXAMINATION:  GENERAL:                      Her physical examination revealed a well-developed, well-nourished female in no acute distress.  HEENT:                        Within normal limits.  NECK:                         Supple.  BREASTS:                      Without masses, tenderness or discharge.  LUNGS:                        Clear to percussion and auscultation.  HEART:                        Normal sinus rhythm, without murmur, rub or gallop.  ABDOMEN:                      Benign with the myomas palpable up to just below the umbilicus.  EXTREMITIES:                  Within normal limits.  NEUROLOGIC:                   Within normal limits.  PELVIC:                       Examination revealed external genitalia and BUS to be within normal limits.  The vagina is clear.  Cervix is firm and nontender.  The uterus is approximately 92- to 18-weeks-size with numerous myomas palpable.  The adnexa are benign.  DIAGNOSIS:                    Leiomyomata uteri.  PLAN:                          The plan is for a myomectomy and possible hysterectomy.  ADDENDUM:  The patient understands the possible complications and care following this type of surgery and accepts the possible risks. DD:  11/27/99 TD:  11/27/99 Job: [id number]

## 2011-05-16 ENCOUNTER — Other Ambulatory Visit: Payer: Self-pay | Admitting: Obstetrics & Gynecology

## 2011-05-16 DIAGNOSIS — R928 Other abnormal and inconclusive findings on diagnostic imaging of breast: Secondary | ICD-10-CM

## 2011-06-27 ENCOUNTER — Ambulatory Visit
Admission: RE | Admit: 2011-06-27 | Discharge: 2011-06-27 | Disposition: A | Discharge status: Home / Self Care | Payer: Self-pay | Source: Ambulatory Visit | Attending: Obstetrics & Gynecology | Admitting: Obstetrics & Gynecology

## 2011-06-27 DIAGNOSIS — R928 Other abnormal and inconclusive findings on diagnostic imaging of breast: Secondary | ICD-10-CM

## 2012-10-26 ENCOUNTER — Ambulatory Visit (INDEPENDENT_AMBULATORY_CARE_PROVIDER_SITE_OTHER): Payer: BC Managed Care – PPO | Admitting: Family Medicine

## 2012-10-26 ENCOUNTER — Ambulatory Visit: Payer: BC Managed Care – PPO

## 2012-10-26 VITALS — BP 130/90 | HR 71 | Temp 98.2°F | Resp 18 | Ht 68.0 in | Wt 146.0 lb

## 2012-10-26 DIAGNOSIS — M25522 Pain in left elbow: Secondary | ICD-10-CM

## 2012-10-26 DIAGNOSIS — M25529 Pain in unspecified elbow: Secondary | ICD-10-CM

## 2012-10-26 MED ORDER — NAPROXEN 500 MG PO TABS: 500.0000 mg | ORAL_TABLET | Freq: Two times a day (BID) | ORAL | Status: DC

## 2012-10-26 NOTE — Progress Notes (Signed)
I saw and examined the patient.  I have reviewed the notes and agree with the plan.

## 2012-10-26 NOTE — Progress Notes (Signed)
Reviewed documentation and xray and agree w/ assessment and plan. Manpreet Kemmer, MD MPH   

## 2012-10-26 NOTE — Progress Notes (Signed)
  Subjective:    Patient ID: Victoria Snyder, female    DOB: [birth year], 47 y.o.   MRN: [id number]  HPI  Victoria Snyder, age 47, presents with left elbow discomfort x 2 weeks. 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 Immediately preceding the onset of discomfort, she switched positioned temporarily, and thus was performing a different repetitive movement - pulling rims of fabric down from above her head. She denies any specific injury to the left upper extremity. Patient states elbow was swollen at onset of discomfort 2 weeks ago. Patient feels she is unable to completely flex or extend the arm and finds a 90 degree position most comfortable. Area of discomfort is over the ulnar groove. Patient wrapped elbow in Ace bandage and poured alcohol over it along with taking 1 tablet ibuprofen 200mg  with only mild relief. No radiation to shoulder, wrist/fingers. Denies discomfort or pain in shoulder and wrist, denies numbness/tingling in fingertips.   Review of Systems Denies: headache, vision changes, dizziness, shortness of breath, chest pain, abdominal pain, nausea, vomiting, diarrhea, constipation, urinary frequency/urgency, peripheral edema.     Objective:   Physical Exam  General: WDWN female, appears stated age in no acute or apparent distress  HEENT: normocephalic, atraumatic, neck supple, no JVD  Resp: clear to auscultation bilaterally in anterior and posterior fields Cardiac: RRR, no murmurs/rubs/gallops Extremities: spontaneously moves all extremities, no obvious deformity, full ROM in left shoulder, full ROM in left wrist/fingers, shoulder/wrist non tender to palpation, limited ROM in elbow aproximately 140 - 45 degrees (pt will not relax enough to even do passive ROM of elbow), tenderness to palpation over the ulnar groove, no erythema or edema in elbow, distal pulses intact, no pain in elbow with flexion extension of wrist  with and without resistance, no pain with pronation and supination Neuro: alert & oriented x 3, cranial nerves II-XII grossly intact Skin: no rashes, lesions, erythema, edema  [facility] reading (PRIMARY) by  Dr. Clelia Croft.  Neg elbow.       Assessment & Plan:   Pain, elbow joint, left - I suspect this is an overuse type of injury. -  Plan: DG Elbow Complete Left  Naproxen to reduce inflammation and discomfort  Elbow rest x 3 days - sling - in hopes that decrease in motion will increase her recovery F/U in 3 days - will plan on

## 2012-10-26 NOTE — Patient Instructions (Addendum)
Wear sling as much as possible. Ice to the elbow for 20 minutes at a time. Recheck in 3 days.

## 2012-11-02 ENCOUNTER — Encounter: Payer: Self-pay | Admitting: Radiology

## 2012-11-09 ENCOUNTER — Ambulatory Visit (INDEPENDENT_AMBULATORY_CARE_PROVIDER_SITE_OTHER): Payer: BC Managed Care – PPO | Admitting: Physician Assistant

## 2012-11-09 VITALS — BP 130/80 | HR 72 | Temp 98.5°F | Resp 16 | Ht 69.0 in | Wt 149.0 lb

## 2012-11-09 DIAGNOSIS — M25529 Pain in unspecified elbow: Secondary | ICD-10-CM

## 2012-11-09 DIAGNOSIS — M25522 Pain in left elbow: Secondary | ICD-10-CM

## 2012-11-09 DIAGNOSIS — R937 Abnormal findings on diagnostic imaging of other parts of musculoskeletal system: Secondary | ICD-10-CM

## 2012-11-09 MED ORDER — NAPROXEN 500 MG PO TABS: 500.0000 mg | ORAL_TABLET | Freq: Two times a day (BID) | ORAL | Status: DC

## 2012-11-09 NOTE — Progress Notes (Signed)
  Subjective:    Patient ID: Victoria Snyder, female    DOB: 05-Dec-1965, 47 y.o.   MRN: [id number]  HPI  Patient presents LEFT elbow discomfort and inability to fully flex or extend x 1 month. Was initially evaluated 2 weeks ago with identical symptoms. Patient was placed on 2 weeks naproxen and in a sling with instructions to rest for 3 days. Suspected overused injury.   Elbow xray performed in office 2 weeks ago was overread by radiologist with an incidental finding of: Faintly calcified soft tissue lesion in the anterior to the distal humerus is favored to represent sequelae of calcific bursitis or myositis ossificans. Less likely, a partially calcified soft tissue mass is difficult to entirely exclude. This could be further evaluated with a follow-up non emergent CT of the elbow if there is clinical concern for an underlying mass. She was called multiple times but was unable to be reached.   At today's visit patient continues to deny pain in elbow but states she has discomfort, especially when trying to extend or flex the left elbow. Denies recent or past elbow injury, symptoms in shoulder/wrist. Prior to pain onset she switched positions at work which required a different repetitive motion than her previous position. States she has not improved or declined since last visit. She was notified of overread by radiologist.    Review of Systems     Objective:   Physical Exam  General: WDWN female, appears stated age, no acute distress Resp: good air entry  Cardiac: normal rate Extremities: LEFT elbow limited ROM in flexion and extension (approximately 140 - 45 degrees), 5/5 strength and radial pulse present. Elbow non tender to palpation except over ulnar groove. No erythema or edema seen. No pain with supination/pronation. Shoulder, wrist, fingers have full ROM.  Skin: no rashes, lesions, erythema, edema.      Assessment & Plan:  Pain, elbow joint, left - Plan: Ambulatory referral to  Orthopedics, naproxen (NAPROSYN) 500 MG tablet  Abnormal musculoskeletal x-ray - Plan: CT Extrem Up Entire Arm L WO/CM, Ambulatory referral to Orthopedics  Continue Naproxen.  Refer to orthopedics for evaluation.  Schedule CT to evaluate radiograph abnormality.

## 2012-11-09 NOTE — Patient Instructions (Addendum)
Continue Naproxen.  If you have not heard from the specialist in 1 week, please call the office.  We will let you know when your CT scan is scheduled.

## 2012-11-10 ENCOUNTER — Other Ambulatory Visit: Payer: Self-pay | Admitting: Physician Assistant

## 2012-11-10 NOTE — Progress Notes (Signed)
I have examined this patient along with the student and agree.  

## 2012-11-13 ENCOUNTER — Other Ambulatory Visit: Payer: Self-pay | Admitting: Physician Assistant

## 2012-11-13 ENCOUNTER — Ambulatory Visit
Admission: RE | Admit: 2012-11-13 | Discharge: 2012-11-13 | Disposition: A | Discharge status: Home / Self Care | Payer: BC Managed Care – PPO | Source: Ambulatory Visit | Attending: Physician Assistant | Admitting: Physician Assistant

## 2012-11-13 DIAGNOSIS — M25522 Pain in left elbow: Secondary | ICD-10-CM

## 2012-11-13 DIAGNOSIS — R937 Abnormal findings on diagnostic imaging of other parts of musculoskeletal system: Secondary | ICD-10-CM

## 2012-12-03 ENCOUNTER — Other Ambulatory Visit: Payer: Self-pay | Admitting: Obstetrics & Gynecology

## 2012-12-03 DIAGNOSIS — N6313 Unspecified lump in the right breast, lower outer quadrant: Secondary | ICD-10-CM

## 2012-12-14 ENCOUNTER — Other Ambulatory Visit: Payer: Self-pay | Admitting: Obstetrics & Gynecology

## 2012-12-14 ENCOUNTER — Ambulatory Visit
Admission: RE | Admit: 2012-12-14 | Discharge: 2012-12-14 | Disposition: A | Discharge status: Home / Self Care | Payer: BC Managed Care – PPO | Source: Ambulatory Visit | Attending: Obstetrics & Gynecology | Admitting: Obstetrics & Gynecology

## 2012-12-14 DIAGNOSIS — N6313 Unspecified lump in the right breast, lower outer quadrant: Secondary | ICD-10-CM

## 2013-01-01 ENCOUNTER — Other Ambulatory Visit: Payer: BC Managed Care – PPO

## 2013-03-10 ENCOUNTER — Ambulatory Visit (INDEPENDENT_AMBULATORY_CARE_PROVIDER_SITE_OTHER): Payer: BC Managed Care – PPO | Admitting: Family Medicine

## 2013-03-10 ENCOUNTER — Ambulatory Visit: Payer: BC Managed Care – PPO

## 2013-03-10 VITALS — BP 118/84 | HR 108 | Temp 100.6°F | Resp 20 | Ht 68.0 in | Wt 148.2 lb

## 2013-03-10 DIAGNOSIS — M129 Arthropathy, unspecified: Secondary | ICD-10-CM

## 2013-03-10 DIAGNOSIS — T148 Other injury of unspecified body region: Secondary | ICD-10-CM

## 2013-03-10 DIAGNOSIS — M545 Low back pain, unspecified: Secondary | ICD-10-CM

## 2013-03-10 DIAGNOSIS — W57XXXA Bitten or stung by nonvenomous insect and other nonvenomous arthropods, initial encounter: Secondary | ICD-10-CM

## 2013-03-10 DIAGNOSIS — M1990 Unspecified osteoarthritis, unspecified site: Secondary | ICD-10-CM

## 2013-03-10 DIAGNOSIS — R509 Fever, unspecified: Secondary | ICD-10-CM

## 2013-03-10 LAB — POCT CBC
Granulocyte percent: 86.1 %G — AB (ref 37–80)
HCT, POC: 49.2 % — AB (ref 37.7–47.9)
Hemoglobin: 16.1 g/dL (ref 12.2–16.2)
Lymph, poc: 0.6 (ref 0.6–3.4)
MCH, POC: 32.6 pg — AB (ref 27–31.2)
MCHC: 32.7 g/dL (ref 31.8–35.4)
MCV: 99.6 fL — AB (ref 80–97)
MID (cbc): 0.3 (ref 0–0.9)
MPV: 8.5 fL (ref 0–99.8)
POC Granulocyte: 6 (ref 2–6.9)
POC LYMPH PERCENT: 9 %L — AB (ref 10–50)
POC MID %: 4.9 %M (ref 0–12)
Platelet Count, POC: 257 10*3/uL (ref 142–424)
RBC: 4.94 M/uL (ref 4.04–5.48)
RDW, POC: 12.5 %
WBC: 7 10*3/uL (ref 4.6–10.2)

## 2013-03-10 LAB — POCT UA - MICROSCOPIC ONLY
Casts, Ur, LPF, POC: NEGATIVE
Crystals, Ur, HPF, POC: NEGATIVE
Mucus, UA: POSITIVE
Yeast, UA: NEGATIVE

## 2013-03-10 LAB — POCT URINALYSIS DIPSTICK
Bilirubin, UA: NEGATIVE
Blood, UA: NEGATIVE
Glucose, UA: NEGATIVE
Ketones, UA: NEGATIVE
Leukocytes, UA: NEGATIVE
Nitrite, UA: NEGATIVE
Spec Grav, UA: 1.02
Urobilinogen, UA: 0.2
pH, UA: 6

## 2013-03-10 MED ORDER — METHOCARBAMOL 500 MG PO TABS: 500.0000 mg | ORAL_TABLET | Freq: Three times a day (TID) | ORAL | Status: DC | PRN

## 2013-03-10 MED ORDER — METHYLPREDNISOLONE (PAK) 4 MG PO TABS: ORAL_TABLET | ORAL | Status: DC

## 2013-03-10 MED ORDER — TRAMADOL HCL 50 MG PO TABS: 50.0000 mg | ORAL_TABLET | Freq: Three times a day (TID) | ORAL | Status: DC | PRN

## 2013-03-10 MED ORDER — IBUPROFEN 600 MG PO TABS: 600.0000 mg | ORAL_TABLET | Freq: Three times a day (TID) | ORAL | Status: DC | PRN

## 2013-03-10 NOTE — Progress Notes (Signed)
[facility]:  Office Visit  Chief Complaint:  Chief Complaint  Patient presents with  . Back Pain    started 2-3 days ago, radiates down to her hip, lower back  . Mass    right arm, pt states she thinks she was bitten, knot formed and bruising    HPI: Victoria Snyder is a 47 y.o. female who is here for :  Patient comes in today with low back pain that's been going on for 3-4 days it has gotten worst today. The pain runs down from her lower back down both her legs when you lift legs up back pain rate is a 10. doesn't remember doing anything to hurt back. Hx of low back pain that she had a MRI in 2010 was DX with Mild Spondylosis. Has tried any heat or ice to area hasn't taken any medicine for the pain. Back bothers her if she standing, sitting, walking, bending or laying down. Has been constipated for awhile has a HX of always being constipated. No problem with urinating. NKI, She works in Designer, fashion/clothing, does a lot of bending, stooping, walking on concrete floors and wears flats, Deneis numbness weakenss. Pain radiates from back to all over leg, stops at knee.   2010 Lumbar MRI IMPRESSION:  Lower lumbar spondylosis, most severe at L4-L5. At this level,  there is a central canal and lateral recess narrowing secondary to  a broad-based disc protrusion which is superimposed upon a diffuse  bulge.  More mild spondylosis, including L3-L4, as described.   Got bitten  by a bug yesterday at work on her right upper arm,  has a bruise and knot on it. No fever no chill no headache no sore throat from the bite.   Past Medical History  Diagnosis Date  . Blood transfusion without reported diagnosis    Past Surgical History  Procedure Laterality Date  . Abdominal hysterectomy     History   Social History  . Marital Status: Married    Spouse Name: N/A    Number of Children: N/A  . Years of Education: N/A   Social History Main Topics  . Smoking status: Never Smoker   .  Smokeless tobacco: None  . Alcohol Use: 0.0 oz/week    1-2 drink(s) per week  . Drug Use: No  . Sexual Activity: Yes   Other Topics Concern  . None   Social History Narrative  . None   Family History  Problem Relation Age of Onset  . Heart disease Mother     heart attack  . Stroke Father    No Known Allergies Prior to Admission medications   Medication Sig Start Date End Date Taking? Authorizing Provider  naproxen (NAPROSYN) 500 MG tablet Take 1 tablet (500 mg total) by mouth 2 (two) times daily with a meal. 11/09/12  Yes Chelle S Jeffery, PA-C     ROS: The patient denies fevers, chills, night sweats, unintentional weight loss, chest pain, palpitations, wheezing, dyspnea on exertion, nausea, vomiting, abdominal pain, dysuria, hematuria, melena, numbness, weakness, or tingling.   All other systems have been reviewed and were otherwise negative with the exception of those mentioned in the HPI and as above.    PHYSICAL EXAM: Filed Vitals:   03/10/13 1133  BP: 118/84  Pulse: 108  Temp: 100.6 F (38.1 C)  Resp: 20   Filed Vitals:   03/10/13 1133  Height: 5\' 8"  (1.727 m)  Weight: 148 lb 3.2 oz (67.223 kg)  Body mass index is 22.54 kg/(m^2).  General: Alert, no acute distress HEENT:  Normocephalic, atraumatic, oropharynx patent. EOMI, PERRLA Cardiovascular:  Regular rate and rhythm, no rubs murmurs or gallops.  No Carotid bruits, radial pulse intact. No pedal edema.  Respiratory: Clear to auscultation bilaterally.  No wheezes, rales, or rhonchi.  No cyanosis, no use of accessory musculature GI: No organomegaly, abdomen is soft and non-tender, positive bowel sounds.  No masses. Skin: + erythematous knot on right upper ar Neurologic: Facial musculature symmetric. Psychiatric: Patient is appropriate throughout our interaction. Lymphatic: No cervical lymphadenopathy Musculoskeletal: Gait antalgic. + paramsk tenderness  Decrease ROM 5/5 strength, 2/2 DTRs No saddle  anesthesia Straight leg + Hip and knee exam--normal  LABS:  EKG/XRAY:   Primary read interpreted by Dr. Conley Rolls at [facility]. Severe DJD in L5-S1 No fx/dislocation   ASSESSMENT/PLAN: Encounter Diagnoses  Name Primary?  . Back pain, lumbosacral Yes  . Fever, unspecified   . Insect bite   . Arthritis    Rx Medrol dose pack, tramadol, robaxin and ibuprofen She will monitor her insect bite, there is a knot there and if it does not improve  With ibuprofen and warm compresses Then will call me back for oral abx such as doxycycline Gross sideeffects, risk and benefits, and alternatives of medications d/w patient. Patient is aware that all medications have potential sideeffects and we are unable to predict every sideeffect or drug-drug interaction that may occur.  LE, THAO PHUONG, DO 03/10/2013 1:10 PM

## 2013-03-29 ENCOUNTER — Encounter: Payer: Self-pay | Admitting: Family Medicine

## 2013-09-25 ENCOUNTER — Emergency Department (HOSPITAL_COMMUNITY)
Admission: EM | Admit: 2013-09-25 | Discharge: 2013-09-25 | Disposition: A | Discharge status: Home / Self Care | Payer: BC Managed Care – PPO | Attending: Emergency Medicine | Admitting: Emergency Medicine

## 2013-09-25 ENCOUNTER — Emergency Department (HOSPITAL_COMMUNITY): Payer: BC Managed Care – PPO

## 2013-09-25 ENCOUNTER — Encounter (HOSPITAL_COMMUNITY): Payer: Self-pay | Admitting: Emergency Medicine

## 2013-09-25 DIAGNOSIS — K5900 Constipation, unspecified: Secondary | ICD-10-CM | POA: Insufficient documentation

## 2013-09-25 DIAGNOSIS — R000 Tachycardia, unspecified: Secondary | ICD-10-CM | POA: Insufficient documentation

## 2013-09-25 LAB — CBC WITH DIFFERENTIAL/PLATELET
Basophils Absolute: 0 10*3/uL (ref 0.0–0.1)
Basophils Relative: 0 % (ref 0–1)
Eosinophils Absolute: 0.1 10*3/uL (ref 0.0–0.7)
Eosinophils Relative: 1 % (ref 0–5)
HCT: 42.5 % (ref 36.0–46.0)
Hemoglobin: 15.2 g/dL — ABNORMAL HIGH (ref 12.0–15.0)
Lymphocytes Relative: 16 % (ref 12–46)
Lymphs Abs: 1.2 10*3/uL (ref 0.7–4.0)
MCH: 32.3 pg (ref 26.0–34.0)
MCHC: 35.8 g/dL (ref 30.0–36.0)
MCV: 90.4 fL (ref 78.0–100.0)
Monocytes Absolute: 0.6 10*3/uL (ref 0.1–1.0)
Monocytes Relative: 8 % (ref 3–12)
Neutro Abs: 6.1 10*3/uL (ref 1.7–7.7)
Neutrophils Relative %: 75 % (ref 43–77)
Platelets: 244 10*3/uL (ref 150–400)
RBC: 4.7 MIL/uL (ref 3.87–5.11)
RDW: 12.2 % (ref 11.5–15.5)
WBC: 8 10*3/uL (ref 4.0–10.5)

## 2013-09-25 LAB — COMPREHENSIVE METABOLIC PANEL
ALT: 26 U/L (ref 0–35)
AST: 22 U/L (ref 0–37)
Albumin: 4.6 g/dL (ref 3.5–5.2)
Alkaline Phosphatase: 70 U/L (ref 39–117)
BUN: 9 mg/dL (ref 6–23)
CO2: 24 mEq/L (ref 19–32)
Calcium: 10 mg/dL (ref 8.4–10.5)
Chloride: 95 mEq/L — ABNORMAL LOW (ref 96–112)
Creatinine, Ser: 0.71 mg/dL (ref 0.50–1.10)
GFR calc Af Amer: >90 mL/min (ref >90)
GFR calc non Af Amer: >90 mL/min (ref >90)
Glucose, Bld: 103 mg/dL — ABNORMAL HIGH (ref 70–99)
Potassium: 3.4 mEq/L — ABNORMAL LOW (ref 3.7–5.3)
Sodium: 136 mEq/L — ABNORMAL LOW (ref 137–147)
Total Bilirubin: 1.1 mg/dL (ref 0.3–1.2)
Total Protein: 7.8 g/dL (ref 6.0–8.3)

## 2013-09-25 MED ORDER — IOHEXOL 300 MG/ML  SOLN
100.0000 mL | Freq: Once | INTRAMUSCULAR | Status: AC | PRN
  Administered 2013-09-25: 100 mL via INTRAVENOUS

## 2013-09-25 MED ORDER — POLYETHYLENE GLYCOL 3350 17 G PO PACK: 17.0000 g | PACK | Freq: Every day | ORAL | Status: DC

## 2013-09-25 MED ORDER — SODIUM CHLORIDE 0.9 % IV BOLUS (SEPSIS)
1000.0000 mL | Freq: Once | INTRAVENOUS | Status: AC
  Administered 2013-09-25: 1000 mL via INTRAVENOUS

## 2013-09-25 MED ORDER — HYDROMORPHONE HCL PF 1 MG/ML IJ SOLN
1.0000 mg | Freq: Once | INTRAMUSCULAR | Status: AC
  Administered 2013-09-25: 1 mg via INTRAVENOUS
  Filled 2013-09-25: qty 1

## 2013-09-25 MED ORDER — MILK AND MOLASSES ENEMA
1.0000 | Freq: Once | RECTAL | Status: AC
  Administered 2013-09-25: 250 mL via RECTAL
  Filled 2013-09-25: qty 250

## 2013-09-25 NOTE — ED Notes (Signed)
Pt had small formed bowel movement. Pt states that she "feels better". Rona Ravens, PA is aware.

## 2013-09-25 NOTE — ED Notes (Addendum)
Pt unhooked from continuous pulse and blood pressure cuff; Pt ambulated to the bathroom without any difficulty or distress; pt asked to attempt to get an urine sample if she could; family at bedside

## 2013-09-25 NOTE — ED Provider Notes (Signed)
Patient with severe constipation, CT scan of the abdomen and pelvis demonstrate no evidence of any malignancy or any other acute emergent condition aside from constipation. Patient will be given milk and molasses enema here. She has had many disimpaction by previous PA provider, with some relief of symptoms.  9:53 PM After receiving milk and molasses enema patient produce a large bowel movement felt much better. Patient is stable for discharge.  Will d/c with miralax.    BP 102/60  Pulse 88  Temp(Src) 98.1 F (36.7 C) (Oral)  Resp 18  Ht 5\' 9"  (1.753 m)  Wt 143 lb (64.864 kg)  BMI 21.11 kg/m2  SpO2 100%  I have reviewed nursing notes and vital signs. I personally reviewed the imaging tests through PACS system  I reviewed available ER/hospitalization records thought the EMR  Results for orders placed during the hospital encounter of 09/25/13  CBC WITH DIFFERENTIAL      Result Value Ref Range   WBC 8.0  4.0 - 10.5 K/uL   RBC 4.70  3.87 - 5.11 MIL/uL   Hemoglobin 15.2 (*) 12.0 - 15.0 g/dL   HCT 42.5  36.0 - 46.0 %   MCV 90.4  78.0 - 100.0 fL   MCH 32.3  26.0 - 34.0 pg   MCHC 35.8  30.0 - 36.0 g/dL   RDW 12.2  11.5 - 15.5 %   Platelets 244  150 - 400 K/uL   Neutrophils Relative % 75  43 - 77 %   Neutro Abs 6.1  1.7 - 7.7 K/uL   Lymphocytes Relative 16  12 - 46 %   Lymphs Abs 1.2  0.7 - 4.0 K/uL   Monocytes Relative 8  3 - 12 %   Monocytes Absolute 0.6  0.1 - 1.0 K/uL   Eosinophils Relative 1  0 - 5 %   Eosinophils Absolute 0.1  0.0 - 0.7 K/uL   Basophils Relative 0  0 - 1 %   Basophils Absolute 0.0  0.0 - 0.1 K/uL  COMPREHENSIVE METABOLIC PANEL      Result Value Ref Range   Sodium 136 (*) 137 - 147 mEq/L   Potassium 3.4 (*) 3.7 - 5.3 mEq/L   Chloride 95 (*) 96 - 112 mEq/L   CO2 24  19 - 32 mEq/L   Glucose, Bld 103 (*) 70 - 99 mg/dL   BUN 9  6 - 23 mg/dL   Creatinine, Ser 0.71  0.50 - 1.10 mg/dL   Calcium 10.0  8.4 - 10.5 mg/dL   Total Protein 7.8  6.0 - 8.3 g/dL   Albumin 4.6  3.5 - 5.2 g/dL   AST 22  0 - 37 U/L   ALT 26  0 - 35 U/L   Alkaline Phosphatase 70  39 - 117 U/L   Total Bilirubin 1.1  0.3 - 1.2 mg/dL   GFR calc non Af Amer >90  >90 mL/min   GFR calc Af Amer >90  >90 mL/min   Ct Abdomen Pelvis W Contrast  09/25/2013   CLINICAL DATA:  Abdominal pain.  Constipation.  EXAM: CT ABDOMEN AND PELVIS WITH CONTRAST  TECHNIQUE: Multidetector CT imaging of the abdomen and pelvis was performed using the standard protocol following bolus administration of intravenous contrast.  CONTRAST:  140mL OMNIPAQUE IOHEXOL 300 MG/ML  SOLN  COMPARISON:  09/10/2008  FINDINGS: There is a moderate increased stool burden throughout the colon with significant increased stool in the rectum. This distends the rectum to 8.3 cm.  There is no colonic wall thickening or adjacent inflammation. No evidence of obstruction. Small bowel is unremarkable. A normal appendix is visualized.  Lung bases are essentially clear.  Heart is normal in size.  Tiny low-density lesion in the right lobe of the liver, most likely a cyst. Liver otherwise unremarkable. Normal spleen, gallbladder and pancreas. No bile duct dilation. No adrenal masses. Small low-density lesion in the medial right kidney, too small to fully characterize but likely a cyst. Kidneys otherwise unremarkable. Normal ureters. Normal bladder.  Uterus is surgically absent.  No pelvic masses.  No adenopathy.  No abnormal fluid collections.  There are degenerative changes of the lower lumbar spine mostly at L5-S1.  IMPRESSION: 1. No acute findings. 2. Significant constipation with moderate increased stool throughout the colon and marked increased stool distending the rectum. No bowel wall thickening or inflammatory changes. No obstruction.   Electronically Signed   By: Lajean Manes M.D.   On: 09/25/2013 20:47   Dg Abd 2 Views  09/25/2013   CLINICAL DATA:  Constipation.  Lower abdominal pain and swelling.  EXAM: ABDOMEN - 2 VIEW  COMPARISON:  CT  dated 09/10/2008.  FINDINGS: Mildly prominent colon containing gas and stool in the abdomen. Soft tissue density with a paucity of gas and stool in the pelvis. No free peritoneal air. Lower lumbar spine degenerative changes. Normal sized heart. The visualized lungs are clear.  IMPRESSION: Soft tissue density in the pelvis with a paucity of intestinal gas and stool. This suggests the possibility of a pelvic mass. If this is a clinical concern, further evaluation with an abdomen and pelvis CT with contrast would be recommended.   Electronically Signed   By: Enrique Sack M.D.   On: 09/25/2013 15:22      Domenic Moras, PA-C 09/25/13 2155

## 2013-09-25 NOTE — ED Notes (Signed)
Pt was unable to produce an urine specimen

## 2013-09-25 NOTE — ED Notes (Signed)
Bedside commode placed at bedside.  

## 2013-09-25 NOTE — ED Notes (Signed)
Pt had a large, formed bowel movement. Rona Ravens, PA is aware.

## 2013-09-25 NOTE — ED Provider Notes (Signed)
Medical screening examination/treatment/procedure(s) were performed by non-physician practitioner and as supervising physician I was immediately available for consultation/collaboration.   EKG Interpretation None        Blanchie Dessert, MD 09/25/13 2250

## 2013-09-25 NOTE — Discharge Instructions (Signed)
Constipation, Adult Constipation is when a person has fewer than 3 bowel movements a week; has difficulty having a bowel movement; or has stools that are dry, hard, or larger than normal. As people grow older, constipation is more common. If you try to fix constipation with medicines that make you have a bowel movement (laxatives), the problem may get worse. Long-term laxative use may cause the muscles of the colon to become weak. A low-fiber diet, not taking in enough fluids, and taking certain medicines may make constipation worse. CAUSES   Certain medicines, such as antidepressants, pain medicine, iron supplements, antacids, and water pills.   Certain diseases, such as diabetes, irritable bowel syndrome (IBS), thyroid disease, or depression.   Not drinking enough water.   Not eating enough fiber-rich foods.   Stress or travel.  Lack of physical activity or exercise.  Not going to the restroom when there is the urge to have a bowel movement.  Ignoring the urge to have a bowel movement.  Using laxatives too much. SYMPTOMS   Having fewer than 3 bowel movements a week.   Straining to have a bowel movement.   Having hard, dry, or larger than normal stools.   Feeling full or bloated.   Pain in the lower abdomen.  Not feeling relief after having a bowel movement. DIAGNOSIS  Your caregiver will take a medical history and perform a physical exam. Further testing may be done for severe constipation. Some tests may include:   A barium enema X-ray to examine your rectum, colon, and sometimes, your small intestine.  A sigmoidoscopy to examine your lower colon.  A colonoscopy to examine your entire colon. TREATMENT  Treatment will depend on the severity of your constipation and what is causing it. Some dietary treatments include drinking more fluids and eating more fiber-rich foods. Lifestyle treatments may include regular exercise. If these diet and lifestyle recommendations  do not help, your caregiver may recommend taking over-the-counter laxative medicines to help you have bowel movements. Prescription medicines may be prescribed if over-the-counter medicines do not work.  HOME CARE INSTRUCTIONS   Increase dietary fiber in your diet, such as fruits, vegetables, whole grains, and beans. Limit high-fat and processed sugars in your diet, such as [place] fries, hamburgers, cookies, candies, and soda.   A fiber supplement may be added to your diet if you cannot get enough fiber from foods.   Drink enough fluids to keep your urine clear or pale yellow.   Exercise regularly or as directed by your caregiver.   Go to the restroom when you have the urge to go. Do not hold it.  Only take medicines as directed by your caregiver. Do not take other medicines for constipation without talking to your caregiver first. [place] IF:   You have bright red blood in your stool.   Your constipation lasts for more than 4 days or gets worse.   You have abdominal or rectal pain.   You have thin, pencil-like stools.  You have unexplained weight loss. MAKE SURE YOU:   Understand these instructions.  Will watch your condition.  Will get help right away if you are not doing well or get worse. Document Released: 02/09/2004 Document Revised: 08/05/2011 Document Reviewed: 02/22/2013 [facility] Patient Information 2014 [place], [place].  Fiber Content in Foods Drinking plenty of fluids and consuming foods high in fiber can help with constipation. See the list below for the fiber content of some common foods. Starches and Grains / Dietary  Fiber (g)  Cheerios, 1 cup / 3 g  Kellogg's Corn Flakes, 1 cup / 0.7 g  Rice Krispies, 1  cup / 0.3 g  Quaker Oat Life Cereal,  cup / 2.1 g  Oatmeal, instant (cooked),  cup / 2 g  Kellogg's Frosted Mini Wheats, 1 cup / 5.1 g  Rice, brown, long-grain (cooked), 1 cup / 3.5 g  Rice, white, long-grain (cooked),  1 cup / 0.6 g  Macaroni, cooked, enriched, 1 cup / 2.5 g Legumes / Dietary Fiber (g)  Beans, baked, canned, plain or vegetarian,  cup / 5.2 g  Beans, kidney, canned,  cup / 6.8 g  Beans, pinto, dried (cooked),  cup / 7.7 g  Beans, pinto, canned,  cup / 5.5 g Breads and Crackers / Dietary Fiber (g)  Graham crackers, plain or honey, 2 squares / 0.7 g  Saltine crackers, 3 squares / 0.3 g  Pretzels, plain, salted, 10 pieces / 1.8 g  Bread, whole-wheat, 1 slice / 1.9 g  Bread, white, 1 slice / 0.7 g  Bread, raisin, 1 slice / 1.2 g  Bagel, plain, 3 oz / 2 g  Tortilla, flour, 1 oz / 0.9 g  Tortilla, corn, 1 small / 1.5 g  Bun, hamburger or hotdog, 1 small / 0.9 g Fruits / Dietary Fiber (g)  Apple, raw with skin, 1 medium / 4.4 g  Applesauce, sweetened,  cup / 1.5 g  Banana,  medium / 1.5 g  Grapes, 10 grapes / 0.4 g  Orange, 1 small / 2.3 g  Raisin, 1.5 oz / 1.6 g  Melon, 1 cup / 1.4 g Vegetables / Dietary Fiber (g)  Green beans, canned,  cup / 1.3 g  Carrots (cooked),  cup / 2.3 g  Broccoli (cooked),  cup / 2.8 g  Peas, frozen (cooked),  cup / 4.4 g  Potatoes, mashed,  cup / 1.6 g  Lettuce, 1 cup / 0.5 g  Corn, canned,  cup / 1.6 g  Tomato,  cup / 1.1 g Document Released: 09/29/2006 Document Revised: 08/05/2011 Document Reviewed: 11/24/2006 [employer] Patient Information 2014 [place], [place].

## 2013-09-25 NOTE — ED Notes (Signed)
Pt place on continuous pulse oximetry and blood pressure cuff; family at bedside

## 2013-09-25 NOTE — ED Notes (Signed)
Pt c/o abdominal pain with constipation onset this morning. Pt last BM couple days ago. Pt unable to sit upright in triage due to pain. Pt denies n/v at present.

## 2013-09-25 NOTE — ED Provider Notes (Signed)
CSN: [id number]     Arrival date & time 09/25/13  1354 History   First MD Initiated Contact with Patient 09/25/13 1420     Chief Complaint  Patient presents with  . Constipation     (Consider location/radiation/quality/duration/timing/severity/associated sxs/prior Treatment) HPI Comments: The patient is a 48 year old female past medical history of hysterectomy presenting to the emergency department with chief complaint of abdominal pain since this morning.  She reports diffuse abdominal pain, onset after attempting to have a bowel movement.  Last bowel movement several days ago, described as hard and small pulse. Denies bright red blood per rectum.  Denies nausea vomiting.  The history is provided by the patient. No language interpreter was used.    Past Medical History  Diagnosis Date  . Blood transfusion without reported diagnosis    Past Surgical History  Procedure Laterality Date  . Abdominal hysterectomy     Family History  Problem Relation Age of Onset  . Heart disease Mother     heart attack  . Stroke Father    History  Substance Use Topics  . Smoking status: Never Smoker   . Smokeless tobacco: Not on file  . Alcohol Use: 0.0 oz/week    1-2 drink(s) per week   OB History   Grav Para Term Preterm Abortions TAB SAB Ect Mult Living                 Review of Systems  Constitutional: Negative for fever and chills.  Respiratory: Negative for cough.   Gastrointestinal: Positive for abdominal pain and constipation. Negative for nausea, vomiting, diarrhea, blood in stool and anal bleeding.  Genitourinary: Negative for dysuria, hematuria, flank pain, vaginal bleeding and vaginal discharge.  All other systems reviewed and are negative.     Allergies  Review of patient's allergies indicates no known allergies.  Home Medications   Prior to Admission medications   Medication Sig Start Date End Date Taking? Authorizing Provider  ibuprofen (ADVIL,MOTRIN) 600 MG tablet  Take 1 tablet (600 mg total) by mouth every 8 (eight) hours as needed for pain. Take with food, no other NSAIDs. 03/10/13   Thao P Le, DO  methocarbamol (ROBAXIN) 500 MG tablet Take 1 tablet (500 mg total) by mouth 3 (three) times daily as needed. 03/10/13   Thao P Le, DO  methylPREDNIsolone (MEDROL DOSPACK) 4 MG tablet follow package directions 03/10/13   Thao P Le, DO  traMADol (ULTRAM) 50 MG tablet Take 1 tablet (50 mg total) by mouth every 8 (eight) hours as needed for pain. For sever pain. Monitor for constipation. Take with Miralax. 03/10/13   Thao P Le, DO   BP 140/102  Pulse 108  Temp(Src) 98.1 F (36.7 C) (Oral)  Resp 20  Ht 5\' 9"  (1.753 m)  Wt 143 lb (64.864 kg)  BMI 21.11 kg/m2  SpO2 100% Physical Exam  Nursing note and vitals reviewed. Constitutional: She is oriented to person, place, and time. She appears well-developed and well-nourished.  Non-toxic appearance. She does not appear ill.  Appears uncomfortable  HENT:  Head: Normocephalic and atraumatic.  Eyes: EOM are normal. Pupils are equal, round, and reactive to light. No scleral icterus.  Neck: Neck supple.  Cardiovascular: Regular rhythm and normal heart sounds.  Tachycardia present.   Pulmonary/Chest: Effort normal. No respiratory distress. She has no wheezes. She has no rales.  Abdominal: Soft. She exhibits distension. There is tenderness. There is no rebound.  Genitourinary: Rectal exam shows no external hemorrhoid and no  tenderness.  Moderate hard, tan, stool in rectal vault. Chaperone present.   Musculoskeletal: Normal range of motion.  Neurological: She is alert and oriented to person, place, and time.  Skin: Skin is warm and dry. No rash noted. She is not diaphoretic.  Psychiatric: She has a normal mood and affect. Her behavior is normal.    ED Course  Fecal disimpaction Date/Time: 09/25/2013 3:45 PM Performed by: Lorrine Kin Authorized by: Lorrine Kin Consent: Verbal consent obtained. Risks and  benefits: risks, benefits and alternatives were discussed Consent given by: patient Patient understanding: patient states understanding of the procedure being performed Patient consent: the patient's understanding of the procedure matches consent given Imaging studies: imaging studies available Required items: required blood products, implants, devices, and special equipment available Patient identity confirmed: verbally with patient and arm band Time out: Immediately prior to procedure a "time out" was called to verify the correct patient, procedure, equipment, support staff and site/side marked as required. Local anesthesia used: no Patient sedated: no Patient tolerance: Patient tolerated the procedure well with no immediate complications. Comments: Moderate amount of tan stool in rectal vault.  Single suppository removed with stool.   (including critical care time) Labs Review Labs Reviewed  CBC WITH DIFFERENTIAL - Abnormal; Notable for the following:    Hemoglobin 15.2 (*)    All other components within normal limits  COMPREHENSIVE METABOLIC PANEL - Abnormal; Notable for the following:    Sodium 136 (*)    Potassium 3.4 (*)    Chloride 95 (*)    Glucose, Bld 103 (*)    All other components within normal limits    Imaging Review Ct Abdomen Pelvis W Contrast  09/25/2013   CLINICAL DATA:  Abdominal pain.  Constipation.  EXAM: CT ABDOMEN AND PELVIS WITH CONTRAST  TECHNIQUE: Multidetector CT imaging of the abdomen and pelvis was performed using the standard protocol following bolus administration of intravenous contrast.  CONTRAST:  165mL OMNIPAQUE IOHEXOL 300 MG/ML  SOLN  COMPARISON:  09/10/2008  FINDINGS: There is a moderate increased stool burden throughout the colon with significant increased stool in the rectum. This distends the rectum to 8.3 cm. There is no colonic wall thickening or adjacent inflammation. No evidence of obstruction. Small bowel is unremarkable. A normal appendix is  visualized.  Lung bases are essentially clear.  Heart is normal in size.  Tiny low-density lesion in the right lobe of the liver, most likely a cyst. Liver otherwise unremarkable. Normal spleen, gallbladder and pancreas. No bile duct dilation. No adrenal masses. Small low-density lesion in the medial right kidney, too small to fully characterize but likely a cyst. Kidneys otherwise unremarkable. Normal ureters. Normal bladder.  Uterus is surgically absent.  No pelvic masses.  No adenopathy.  No abnormal fluid collections.  There are degenerative changes of the lower lumbar spine mostly at L5-S1.  IMPRESSION: 1. No acute findings. 2. Significant constipation with moderate increased stool throughout the colon and marked increased stool distending the rectum. No bowel wall thickening or inflammatory changes. No obstruction.   Electronically Signed   By: Lajean Manes M.D.   On: 09/25/2013 20:47   Dg Abd 2 Views  09/25/2013   CLINICAL DATA:  Constipation.  Lower abdominal pain and swelling.  EXAM: ABDOMEN - 2 VIEW  COMPARISON:  CT dated 09/10/2008.  FINDINGS: Mildly prominent colon containing gas and stool in the abdomen. Soft tissue density with a paucity of gas and stool in the pelvis. No free peritoneal air. Lower  lumbar spine degenerative changes. Normal sized heart. The visualized lungs are clear.  IMPRESSION: Soft tissue density in the pelvis with a paucity of intestinal gas and stool. This suggests the possibility of a pelvic mass. If this is a clinical concern, further evaluation with an abdomen and pelvis CT with contrast would be recommended.   Electronically Signed   By: Enrique Sack M.D.   On: 09/25/2013 15:22     EKG Interpretation None      MDM   Final diagnoses:  Constipation   Pt presents with constipation.  Rectal exam reveals impaction, disimpaction performed, still large amount of stool in vault. XR shows possible pelvic mass.  Discussed with pt and CT ordered. Pt care assumed by Domenic Moras, PA-C will follow up with the CT for possible pelvic mass.\  Meds given in ED:  Medications  HYDROmorphone (DILAUDID) injection 1 mg (1 mg Intravenous Given 09/25/13 1520)  sodium chloride 0.9 % bolus 1,000 mL (0 mLs Intravenous Stopped 09/25/13 2005)  milk and molasses enema (250 mLs Rectal Given 09/25/13 2105)  iohexol (OMNIPAQUE) 300 MG/ML solution 100 mL (100 mLs Intravenous Contrast Given 09/25/13 2016)    Discharge Medication List as of 09/25/2013  9:56 PM    START taking these medications   Details  polyethylene glycol (MIRALAX / GLYCOLAX) packet Take 17 g by mouth daily., Starting 09/25/2013, Until Discontinued, Print            Lorrine Kin, PA-C 09/27/13 [place], PA-C 09/27/13 1314

## 2013-09-28 NOTE — ED Provider Notes (Signed)
Medical screening examination/treatment/procedure(s) were performed by non-physician practitioner and as supervising physician I was immediately available for consultation/collaboration.   EKG Interpretation None       Jasper Riling. Alvino Chapel, MD 09/28/13 1209

## 2014-02-03 ENCOUNTER — Ambulatory Visit (INDEPENDENT_AMBULATORY_CARE_PROVIDER_SITE_OTHER): Payer: BC Managed Care – PPO | Admitting: Family Medicine

## 2014-02-03 VITALS — BP 128/82 | HR 84 | Temp 98.5°F | Resp 16 | Ht 67.5 in | Wt 145.6 lb

## 2014-02-03 DIAGNOSIS — R05 Cough: Secondary | ICD-10-CM

## 2014-02-03 DIAGNOSIS — J209 Acute bronchitis, unspecified: Secondary | ICD-10-CM

## 2014-02-03 DIAGNOSIS — R059 Cough, unspecified: Secondary | ICD-10-CM

## 2014-02-03 LAB — POCT CBC
Granulocyte percent: 69.7 %G (ref 37–80)
HCT, POC: 45.9 % (ref 37.7–47.9)
Hemoglobin: 15.6 g/dL (ref 12.2–16.2)
Lymph, poc: 1.6 (ref 0.6–3.4)
MCH, POC: 32.5 pg — AB (ref 27–31.2)
MCHC: 34 g/dL (ref 31.8–35.4)
MCV: 95.6 fL (ref 80–97)
MID (cbc): 0.4 (ref 0–0.9)
MPV: 7.2 fL (ref 0–99.8)
POC Granulocyte: 4.6 (ref 2–6.9)
POC LYMPH PERCENT: 23.8 %L (ref 10–50)
POC MID %: 6.5 %M (ref 0–12)
Platelet Count, POC: 223 10*3/uL (ref 142–424)
RBC: 4.8 M/uL (ref 4.04–5.48)
RDW, POC: 12.7 %
WBC: 6.6 10*3/uL (ref 4.6–10.2)

## 2014-02-03 MED ORDER — AZITHROMYCIN 250 MG PO TABS: ORAL_TABLET | ORAL | Status: DC

## 2014-02-03 MED ORDER — GUAIFENESIN ER 1200 MG PO TB12: 1.0000 | ORAL_TABLET | Freq: Two times a day (BID) | ORAL | Status: DC | PRN

## 2014-02-03 MED ORDER — HYDROCOD POLST-CHLORPHEN POLST 10-8 MG/5ML PO LQCR: 5.0000 mL | Freq: Two times a day (BID) | ORAL | Status: DC | PRN

## 2014-02-03 MED ORDER — ALBUTEROL SULFATE (2.5 MG/3ML) 0.083% IN NEBU
2.5000 mg | INHALATION_SOLUTION | Freq: Once | RESPIRATORY_TRACT | Status: AC
  Administered 2014-02-03: 2.5 mg via RESPIRATORY_TRACT

## 2014-02-03 MED ORDER — FLUTICASONE PROPIONATE 50 MCG/ACT NA SUSP: 2.0000 | Freq: Every day | NASAL | Status: DC

## 2014-02-03 NOTE — Progress Notes (Signed)
Subjective:  This chart was scribed for Victoria Cheadle, MD, by Victoria Snyder, ED Scribe. This patient's care was started at 8:47 AM.    Patient ID: Victoria Snyder, female    DOB: 12-24-1965, 48 y.o.   MRN: [id number]  Chief Complaint  Patient presents with  . Sinusitis    Sinus press;HA; cough-dry x 3 dys    HPI  Victoria Snyder is a 48 y.o. female who presents to [facility] complaining of a non-productive cough, onset three days ago, which has been associated with a generalized headache and sinus pressure. The headache is distributed to the pt's frontal and periorbital regionals as well as the zygomatic arches. She denies a sore throat, otalgia, or nasal congestion. She also denies a fever, chills, SOB, or decreased fluids or solids intake. She has treated her symptoms with one dose of Mucinex as well as IBU without resolution. The pt reports her daughter is currently taking antibiotics for walking-pneumonia. Victoria Snyder is a former smoker.   Past Medical History  Diagnosis Date  . Blood transfusion without reported diagnosis    Current Outpatient Prescriptions on File Prior to Visit  Medication Sig Dispense Refill  . Ascorbic Acid (VITAMIN C) 1000 MG tablet Take 1,000 mg by mouth daily.      . Multiple Vitamins-Minerals (MULTIVITAMIN WITH MINERALS) tablet Take 1 tablet by mouth daily.      . polyethylene glycol (MIRALAX / GLYCOLAX) packet Take 17 g by mouth daily.  14 each  0   No current facility-administered medications on file prior to visit.   No Known Allergies   Review of Systems  Constitutional: Negative for fever, chills and appetite change.  HENT: Positive for sinus pressure. Negative for congestion, ear pain and sore throat.   Respiratory: Positive for cough. Negative for shortness of breath.   Neurological: Positive for headaches.    Vitals: BP 128/82  Pulse 84  Temp(Src) 98.5 F (36.9 C) (Oral)  Resp 16  Ht 5' 7.5" (1.715 m)  Wt 145 lb 9.6 oz (66.044 kg)  BMI 22.45  kg/m2  SpO2 99%    Objective:   Physical Exam  Nursing note and vitals reviewed. Constitutional: She is oriented to person, place, and time. She appears well-developed and well-nourished. No distress.  HENT:  Head: Normocephalic and atraumatic.  Right Ear: Tympanic membrane is retracted. A middle ear effusion is present.  Left Ear: Tympanic membrane is retracted. A middle ear effusion is present.  Nose: Mucosal edema present.  Mouth/Throat: Uvula is midline, oropharynx is clear and moist and mucous membranes are normal. No oropharyngeal exudate, posterior oropharyngeal edema, posterior oropharyngeal erythema or tonsillar abscesses.  Eyes: Conjunctivae and EOM are normal.  Neck: Neck supple. No tracheal deviation present. No thyromegaly present.  Cardiovascular: Normal rate.   Pulmonary/Chest: Effort normal. No respiratory distress. She has wheezes. She has rhonchi.  Mild diffuse inspiratory wheezes and rhonchi, worse on the left lower lobe.   Musculoskeletal: Normal range of motion.  Lymphadenopathy:       Head (right side): No submental, no submandibular, no preauricular and no posterior auricular adenopathy present.       Head (left side): No submental, no submandibular, no preauricular and no posterior auricular adenopathy present.    She has cervical adenopathy.       Right: No supraclavicular adenopathy present.       Left: No supraclavicular adenopathy present.  Mild anterior cervical adenopathy bilaterally.   Neurological: She is alert and oriented to  person, place, and time.  Skin: Skin is warm and dry.  Psychiatric: She has a normal mood and affect. Her behavior is normal.   The pt's expected peak flow is 475. In the office, her peak flow is 350. Given albuterol neb and was unchanged after.      Assessment & Plan:   Results for orders placed in visit on 02/03/14  POCT CBC      Result Value Ref Range   WBC 6.6  4.6 - 10.2 K/uL   Lymph, poc 1.6  0.6 - 3.4   POC LYMPH  PERCENT 23.8  10 - 50 %L   MID (cbc) 0.4  0 - 0.9   POC MID % 6.5  0 - 12 %M   POC Granulocyte 4.6  2 - 6.9   Granulocyte percent 69.7  37 - 80 %G   RBC 4.80  4.04 - 5.48 M/uL   Hemoglobin 15.6  12.2 - 16.2 g/dL   HCT, POC 45.9  37.7 - 47.9 %   MCV 95.6  80 - 97 fL   MCH, POC 32.5 (*) 27 - 31.2 pg   MCHC 34.0  31.8 - 35.4 g/dL   RDW, POC 12.7     Platelet Count, POC 223  142 - 424 K/uL   MPV 7.2  0 - 99.8 fL   Cough - Plan: POCT CBC, albuterol (PROVENTIL) (2.5 MG/3ML) 0.083% nebulizer solution 2.5 mg - may have walking pna as daughter is currently diagnosed w/ same thing - discussed 2-3d until starting to feel better - if not starting to get better in 3d, RTC for CXR and reassessment. Sxs should be resolved in 7-12d but may have persistent cough for several weeks so use tussionex qhs  Acute bronchitis, unspecified organism  Meds ordered this encounter  Medications  . albuterol (PROVENTIL) (2.5 MG/3ML) 0.083% nebulizer solution 2.5 mg    Sig:   . azithromycin (ZITHROMAX) 250 MG tablet    Sig: Take 2 tabs PO x 1 dose, then 1 tab PO QD x 4 days    Dispense:  6 tablet    Refill:  0  . chlorpheniramine-HYDROcodone (TUSSIONEX PENNKINETIC ER) 10-8 MG/5ML LQCR    Sig: Take 5 mLs by mouth every 12 (twelve) hours as needed.    Dispense:  120 mL    Refill:  0  . fluticasone (FLONASE) 50 MCG/ACT nasal spray    Sig: Place 2 sprays into both nostrils at bedtime.    Dispense:  16 g    Refill:  2  . Guaifenesin (MUCINEX MAXIMUM STRENGTH) 1200 MG TB12    Sig: Take 1 tablet (1,200 mg total) by mouth every 12 (twelve) hours as needed.    Dispense:  14 tablet    Refill:  1    I personally performed the services described in this documentation, which was scribed in my presence. The recorded information has been reviewed and considered, and addended by me as needed.  Victoria Cheadle, MD MPH

## 2014-02-03 NOTE — Patient Instructions (Addendum)
Start Afrin but use it for 3 days only.  Do not use it for longer or your symptoms will become worse when you stop.  Use the flonase now and make sure to use it nightly for up to 3 weeks.  Acute Bronchitis Bronchitis is inflammation of the airways that extend from the windpipe into the lungs (bronchi). The inflammation often causes mucus to develop. This leads to a cough, which is the most common symptom of bronchitis.  In acute bronchitis, the condition usually develops suddenly and goes away over time, usually in a couple weeks. Smoking, allergies, and asthma can make bronchitis worse. Repeated episodes of bronchitis may cause further lung problems.  CAUSES Acute bronchitis is most often caused by the same virus that causes a cold. The virus can spread from person to person (contagious) through coughing, sneezing, and touching contaminated objects. SIGNS AND SYMPTOMS   Cough.   Fever.   Coughing up mucus.   Body aches.   Chest congestion.   Chills.   Shortness of breath.   Sore throat.  DIAGNOSIS  Acute bronchitis is usually diagnosed through a physical exam. Your health care provider will also ask you questions about your medical history. Tests, such as chest X-rays, are sometimes done to rule out other conditions.  TREATMENT  Acute bronchitis usually goes away in a couple weeks. Oftentimes, no medical treatment is necessary. Medicines are sometimes given for relief of fever or cough. Antibiotic medicines are usually not needed but may be prescribed in certain situations. In some cases, an inhaler may be recommended to help reduce shortness of breath and control the cough. A cool mist vaporizer may also be used to help thin bronchial secretions and make it easier to clear the chest.  HOME CARE INSTRUCTIONS  Get plenty of rest.   Drink enough fluids to keep your urine clear or pale yellow (unless you have a medical condition that requires fluid restriction). Increasing  fluids may help thin your respiratory secretions (sputum) and reduce chest congestion, and it will prevent dehydration.   Take medicines only as directed by your health care provider.  If you were prescribed an antibiotic medicine, finish it all even if you start to feel better.  Avoid smoking and secondhand smoke. Exposure to cigarette smoke or irritating chemicals will make bronchitis worse. If you are a smoker, consider using nicotine gum or skin patches to help control withdrawal symptoms. Quitting smoking will help your lungs heal faster.   Reduce the chances of another bout of acute bronchitis by washing your hands frequently, avoiding people with cold symptoms, and trying not to touch your hands to your mouth, nose, or eyes.   Keep all follow-up visits as directed by your health care provider.  SEEK MEDICAL CARE IF: Your symptoms do not improve after 1 week of treatment.  SEEK IMMEDIATE MEDICAL CARE IF:  You develop an increased fever or chills.   You have chest pain.   You have severe shortness of breath.  You have bloody sputum.   You develop dehydration.  You faint or repeatedly feel like you are going to pass out.  You develop repeated vomiting.  You develop a severe headache. MAKE SURE YOU:   Understand these instructions.  Will watch your condition.  Will get help right away if you are not doing well or get worse. Document Released: 06/20/2004 Document Revised: 09/27/2013 Document Reviewed: 11/03/2012 [facility] Patient Information 2015 [place], [place]. This information is not intended to replace advice given to  to you by your health care provider. Make sure you discuss any questions you have with your health care provider.  

## 2014-02-06 ENCOUNTER — Ambulatory Visit (INDEPENDENT_AMBULATORY_CARE_PROVIDER_SITE_OTHER): Payer: BC Managed Care – PPO | Admitting: Family Medicine

## 2014-02-06 ENCOUNTER — Ambulatory Visit (INDEPENDENT_AMBULATORY_CARE_PROVIDER_SITE_OTHER): Payer: BC Managed Care – PPO

## 2014-02-06 VITALS — BP 120/90 | HR 92 | Temp 98.3°F | Resp 16 | Ht 68.0 in | Wt 145.6 lb

## 2014-02-06 DIAGNOSIS — R05 Cough: Secondary | ICD-10-CM

## 2014-02-06 DIAGNOSIS — R059 Cough, unspecified: Secondary | ICD-10-CM

## 2014-02-06 DIAGNOSIS — J209 Acute bronchitis, unspecified: Secondary | ICD-10-CM

## 2014-02-06 DIAGNOSIS — R51 Headache: Secondary | ICD-10-CM

## 2014-02-06 LAB — POCT URINALYSIS DIPSTICK
Bilirubin, UA: NEGATIVE
Blood, UA: NEGATIVE
Glucose, UA: NEGATIVE
Ketones, UA: NEGATIVE
Nitrite, UA: NEGATIVE
Protein, UA: NEGATIVE
Spec Grav, UA: 1.025
Urobilinogen, UA: 0.2
pH, UA: 5.5

## 2014-02-06 LAB — POCT UA - MICROSCOPIC ONLY
Casts, Ur, LPF, POC: NEGATIVE
Crystals, Ur, HPF, POC: NEGATIVE
Mucus, UA: NEGATIVE
RBC, urine, microscopic: NEGATIVE
Yeast, UA: NEGATIVE

## 2014-02-06 LAB — POCT CBC
Granulocyte percent: 62.8 %G (ref 37–80)
HCT, POC: 50.5 % — AB (ref 37.7–47.9)
Hemoglobin: 17 g/dL — AB (ref 12.2–16.2)
Lymph, poc: 1.9 (ref 0.6–3.4)
MCH, POC: 32 pg — AB (ref 27–31.2)
MCHC: 33.6 g/dL (ref 31.8–35.4)
MCV: 95.5 fL (ref 80–97)
MID (cbc): 0.3 (ref 0–0.9)
MPV: 6.9 fL (ref 0–99.8)
POC Granulocyte: 3.7 (ref 2–6.9)
POC LYMPH PERCENT: 31.8 %L (ref 10–50)
POC MID %: 5.4 %M (ref 0–12)
Platelet Count, POC: 273 10*3/uL (ref 142–424)
RBC: 5.29 M/uL (ref 4.04–5.48)
RDW, POC: 12.3 %
WBC: 5.9 10*3/uL (ref 4.6–10.2)

## 2014-02-06 LAB — POCT SEDIMENTATION RATE: POCT SED RATE: 2 mm/hr (ref 0–22)

## 2014-02-06 MED ORDER — KETOROLAC TROMETHAMINE 60 MG/2ML IM SOLN
60.0000 mg | Freq: Once | INTRAMUSCULAR | Status: AC
  Administered 2014-02-06: 60 mg via INTRAMUSCULAR

## 2014-02-06 MED ORDER — HYDROCODONE-ACETAMINOPHEN 7.5-325 MG/15ML PO SOLN: 10.0000 mL | Freq: Four times a day (QID) | ORAL | Status: DC | PRN

## 2014-02-06 MED ORDER — HYDROCOD POLST-CHLORPHEN POLST 10-8 MG/5ML PO LQCR: 5.0000 mL | Freq: Two times a day (BID) | ORAL | Status: DC | PRN

## 2014-02-06 MED ORDER — BUTALBITAL-APAP-CAFFEINE 50-325-40 MG PO TABS: 1.0000 | ORAL_TABLET | Freq: Four times a day (QID) | ORAL | Status: AC | PRN

## 2014-02-06 MED ORDER — PROMETHAZINE HCL 25 MG PO TABS: 25.0000 mg | ORAL_TABLET | Freq: Three times a day (TID) | ORAL | Status: DC | PRN

## 2014-02-06 MED ORDER — PROMETHAZINE HCL 12.5 MG PO TABS
50.0000 mg | ORAL_TABLET | Freq: Once | ORAL | Status: AC
  Administered 2014-02-06: 50 mg via ORAL

## 2014-02-06 NOTE — Patient Instructions (Signed)
Dehydration, Adult Dehydration is when you lose more fluids from the body than you take in. Vital organs like the kidneys, brain, and heart cannot function without a proper amount of fluids and salt. Any loss of fluids from the body can cause dehydration.  CAUSES   Vomiting.  Diarrhea.  Excessive sweating.  Excessive urine output.  Fever. SYMPTOMS  Mild dehydration  Thirst.  Dry lips.  Slightly dry mouth. Moderate dehydration  Very dry mouth.  Sunken eyes.  Skin does not bounce back quickly when lightly pinched and released.  Dark urine and decreased urine production.  Decreased tear production.  Headache. Severe dehydration  Very dry mouth.  Extreme thirst.  Rapid, weak pulse (more than 100 beats per minute at rest).  Cold hands and feet.  Not able to sweat in spite of heat and temperature.  Rapid breathing.  Blue lips.  Confusion and lethargy.  Difficulty being awakened.  Minimal urine production.  No tears. DIAGNOSIS  Your caregiver will diagnose dehydration based on your symptoms and your exam. Blood and urine tests will help confirm the diagnosis. The diagnostic evaluation should also identify the cause of dehydration. TREATMENT  Treatment of mild or moderate dehydration can often be done at home by increasing the amount of fluids that you drink. It is best to drink small amounts of fluid more often. Drinking too much at one time can make vomiting worse. Refer to the home care instructions below. Severe dehydration needs to be treated at the hospital where you will probably be given intravenous (IV) fluids that contain water and electrolytes. HOME CARE INSTRUCTIONS   Ask your caregiver about specific rehydration instructions.  Drink enough fluids to keep your urine clear or pale yellow.  Drink small amounts frequently if you have nausea and vomiting.  Eat as you normally do.  Avoid:  Foods or drinks high in sugar.  Carbonated  drinks.  Juice.  Extremely hot or cold fluids.  Drinks with caffeine.  Fatty, greasy foods.  Alcohol.  Tobacco.  Overeating.  Gelatin desserts.  Wash your hands well to avoid spreading bacteria and viruses.  Only take over-the-counter or prescription medicines for pain, discomfort, or fever as directed by your caregiver.  Ask your caregiver if you should continue all prescribed and over-the-counter medicines.  Keep all follow-up appointments with your caregiver. SEEK MEDICAL CARE IF:  You have abdominal pain and it increases or stays in one area (localizes).  You have a rash, stiff neck, or severe headache.  You are irritable, sleepy, or difficult to awaken.  You are weak, dizzy, or extremely thirsty. SEEK IMMEDIATE MEDICAL CARE IF:   You are unable to keep fluids down or you get worse despite treatment.  You have frequent episodes of vomiting or diarrhea.  You have blood or green matter (bile) in your vomit.  You have blood in your stool or your stool looks black and tarry.  You have not urinated in 6 to 8 hours, or you have only urinated a small amount of very dark urine.  You have a fever.  You faint. MAKE SURE YOU:   Understand these instructions.  Will watch your condition.  Will get help right away if you are not doing well or get worse. Document Released: 05/13/2005 Document Revised: 08/05/2011 Document Reviewed: 12/31/2010 Exit[employer] Patient Information 2015 [employer]. This information is not intended to replace advice given to you by your health care provider. Make sure you discuss any questions you have with your health care   provider.  

## 2014-02-06 NOTE — Progress Notes (Signed)
Subjective:  This chart was scribed for Delman Cheadle, MD, by Starleen Arms, Medical Scribe. This patient was seen in room Rm 2 and the patient's care was started at 2:51 PM.   Patient ID: Victoria Snyder, female    DOB: 1965-07-19, 48 y.o.   MRN: [id number] Chief Complaint  Patient presents with  . Follow-up    pt seen 9/10--no better today.  still having the headache.     HPI HPI Comments: Victoria Snyder is a 48 y.o. female who was seen on 9/10 for a cough, sinus pressure, and headache and prescribed azithromycin and Tussionex ER.  She reports compliance with the azithromycin but denies filling the prescription for Tussionex ER.  She presents to [facility] complaining of unresolved waxing and waning severe headache in the top of her head described as achey.  Patient reports mild photophobia and phonophobia.  Patient reports that touch and movement, specifically bending over, aggravates the headache.  Patient also reports moderate dizziness.  She denies a previous history of similar episodes.  Patient also complains of neck stiffness currently describes as acheyness.  She denies other muscle soreness.  Patient reports occasional bowel irregularity relieved by Miralax.  Her last normal bowel movement was yesterday. Patient has taken Mucinex and aleve, but did not today, and reports transient, moderate relief.  Patient denies appetite changes, weight changes, dysuria, hematuria.  Past Medical History  Diagnosis Date  . Blood transfusion without reported diagnosis    Current Outpatient Prescriptions on File Prior to Visit  Medication Sig Dispense Refill  . Ascorbic Acid (VITAMIN C) 1000 MG tablet Take 1,000 mg by mouth daily.      Marland Kitchen azithromycin (ZITHROMAX) 250 MG tablet Take 2 tabs PO x 1 dose, then 1 tab PO QD x 4 days  6 tablet  0  . chlorpheniramine-HYDROcodone (TUSSIONEX PENNKINETIC ER) 10-8 MG/5ML LQCR Take 5 mLs by mouth every 12 (twelve) hours as needed.  120 mL  0  . fluticasone (FLONASE) 50  MCG/ACT nasal spray Place 2 sprays into both nostrils at bedtime.  16 g  2  . Guaifenesin (MUCINEX MAXIMUM STRENGTH) 1200 MG TB12 Take 1 tablet (1,200 mg total) by mouth every 12 (twelve) hours as needed.  14 tablet  1  . Multiple Vitamins-Minerals (MULTIVITAMIN WITH MINERALS) tablet Take 1 tablet by mouth daily.      . polyethylene glycol (MIRALAX / GLYCOLAX) packet Take 17 g by mouth daily.  14 each  0   No current facility-administered medications on file prior to visit.   No Known Allergies   Review of Systems  Constitutional: Negative for appetite change and unexpected weight change.  Gastrointestinal: Negative for nausea, vomiting, diarrhea and constipation.  Genitourinary: Negative for dysuria and frequency.  Neurological: Positive for headaches. Negative for dizziness and light-headedness.       Objective:       Physical Exam  HENT:  Right Ear: Tympanic membrane is injected and retracted. A middle ear effusion is present.  Left Ear: Tympanic membrane is injected and retracted. A middle ear effusion is present.  Nose: Nose normal. Right sinus exhibits no maxillary sinus tenderness and no frontal sinus tenderness. Left sinus exhibits no maxillary sinus tenderness and no frontal sinus tenderness.  Mouth/Throat: Posterior oropharyngeal erythema present. No oropharyngeal exudate or posterior oropharyngeal edema.  No preauricular or postauricular adenopathy.  Normal fundoscopic exam bilaterally.    Cardiovascular: Normal rate, regular rhythm and normal heart sounds.   Pulmonary/Chest: Effort normal. No respiratory  distress. She has no wheezes. She has rhonchi. She has no rales.  Bibasilar inspiratory and expiratory rhonchi.  Abdominal: Soft. Bowel sounds are normal. She exhibits no distension. There is no tenderness.  Genitourinary:  Normal cervical ROM without pain.  Lymphadenopathy:    She has cervical adenopathy (anterior bilaterally and posterior ).  Negative  orthostatics  BP 120/90  Pulse 92  Temp(Src) 98.3 F (36.8 C) (Oral)  Resp 16  Ht 5\' 8"  (1.727 m)  Wt 145 lb 9.6 oz (66.044 kg)  BMI 22.14 kg/m2  SpO2 97%  3:34 PM Primary X-ray reading by Dr. Brigitte Pulse: CXR: Question of mild infiltrate in left lower lobe.     EXAM: CHEST 2 VIEW  COMPARISON: None.  FINDINGS: The lungs appear hyperinflated. No airspace opacity identified. Heart size is normal. No pleural effusion or edema. The visualized osseous structures appear unremarkable.  IMPRESSION: 1. No acute cardiopulmonary abnormalities. 2. Hyperinflation.  Results for orders placed in visit on 02/06/14  POCT UA - MICROSCOPIC ONLY      Result Value Ref Range   WBC, Ur, HPF, POC 2-4     RBC, urine, microscopic negative     Bacteria, U Microscopic trace     Mucus, UA negative     Epithelial cells, urine per micros 8-15     Crystals, Ur, HPF, POC negative     Casts, Ur, LPF, POC negative     Yeast, UA negative    POCT URINALYSIS DIPSTICK      Result Value Ref Range   Color, UA yellow     Clarity, UA slightly cloudy     Glucose, UA negative     Bilirubin, UA negative     Ketones, UA negative     Spec Grav, UA 1.025     Blood, UA negative     pH, UA 5.5     Protein, UA negative     Urobilinogen, UA 0.2     Nitrite, UA negative     Leukocytes, UA Trace    POCT CBC      Result Value Ref Range   WBC 5.9  4.6 - 10.2 K/uL   Lymph, poc 1.9  0.6 - 3.4   POC LYMPH PERCENT 31.8  10 - 50 %L   MID (cbc) 0.3  0 - 0.9   POC MID % 5.4  0 - 12 %M   POC Granulocyte 3.7  2 - 6.9   Granulocyte percent 62.8  37 - 80 %G   RBC 5.29  4.04 - 5.48 M/uL   Hemoglobin 17.0 (*) 12.2 - 16.2 g/dL   HCT, POC 50.5 (*) 37.7 - 47.9 %   MCV 95.5  80 - 97 fL   MCH, POC 32.0 (*) 27 - 31.2 pg   MCHC 33.6  31.8 - 35.4 g/dL   RDW, POC 12.3     Platelet Count, POC 273.0  142 - 424 K/uL   MPV 6.9  0 - 99.8 fL  POCT SEDIMENTATION RATE      Result Value Ref Range   POCT SED RATE 2  0 - 22 mm/hr     Assessment & Plan:  3:03 PM Advised patient of plan to perform CXR and order labs.  Informed patient that she will be given toradol 60 IM x 1 and promethazine 50mg  po x 1 in clinic to treat headache. Advised patient that if HA continues, needs to go to the ED as would need a head CT-scan and consider  LP. Advised patient to rest and sleep to aid in symptom resolution.  Oow note x 1 wk - recheck w/ me in 4d, sooner if worse.  Suspect HA is due to dehydration from concentrated UA and elevated hgb - push fluids.  4:00 PM Patient not given prescription for Hycet - will try Tussionex instead.  Cough - Plan: DG Chest 2 View, POCT UA - Microscopic Only, POCT urinalysis dipstick, POCT CBC, POCT SEDIMENTATION RATE  Headache(784.0) - Plan: POCT UA - Microscopic Only, POCT urinalysis dipstick, POCT CBC, POCT SEDIMENTATION RATE, promethazine (PHENERGAN) tablet 50 mg, ketorolac (TORADOL) injection 60 mg  Acute bronchitis, unspecified organism  Meds ordered this encounter  Medications  . DISCONTD: HYDROcodone-acetaminophen (HYCET) 7.5-325 mg/15 ml solution    Sig: Take 10-15 mLs by mouth every 6 (six) hours as needed.    Dispense:  140 mL    Refill:  0  . promethazine (PHENERGAN) tablet 50 mg    Sig:   . ketorolac (TORADOL) injection 60 mg    Sig:   . promethazine (PHENERGAN) 25 MG tablet    Sig: Take 1 tablet (25 mg total) by mouth every 8 (eight) hours as needed for nausea or vomiting.    Dispense:  20 tablet    Refill:  0  . butalbital-acetaminophen-caffeine (FIORICET) 50-325-40 MG per tablet    Sig: Take 1-2 tablets by mouth every 6 (six) hours as needed for headache.    Dispense:  20 tablet    Refill:  0  . chlorpheniramine-HYDROcodone (TUSSIONEX PENNKINETIC ER) 10-8 MG/5ML LQCR    Sig: Take 5 mLs by mouth every 12 (twelve) hours as needed.    Dispense:  120 mL    Refill:  0    I personally performed the services described in this documentation, which was scribed in my presence. The  recorded information has been reviewed and considered, and addended by me as needed.  Delman Cheadle, MD MPH

## 2014-02-10 ENCOUNTER — Ambulatory Visit (INDEPENDENT_AMBULATORY_CARE_PROVIDER_SITE_OTHER): Payer: BC Managed Care – PPO | Admitting: Family Medicine

## 2014-02-10 VITALS — BP 122/76 | HR 82 | Temp 98.1°F | Resp 18 | Ht 69.0 in | Wt 148.6 lb

## 2014-02-10 DIAGNOSIS — R05 Cough: Secondary | ICD-10-CM

## 2014-02-10 DIAGNOSIS — R059 Cough, unspecified: Secondary | ICD-10-CM

## 2014-02-10 DIAGNOSIS — J209 Acute bronchitis, unspecified: Secondary | ICD-10-CM

## 2014-02-10 MED ORDER — METHYLPREDNISOLONE ACETATE 80 MG/ML IJ SUSP
80.0000 mg | Freq: Once | INTRAMUSCULAR | Status: AC
  Administered 2014-02-10: 80 mg via INTRAMUSCULAR

## 2014-02-10 MED ORDER — ALBUTEROL SULFATE HFA 108 (90 BASE) MCG/ACT IN AERS: 2.0000 | INHALATION_SPRAY | RESPIRATORY_TRACT | Status: DC | PRN

## 2014-02-10 MED ORDER — DOXYCYCLINE HYCLATE 100 MG PO CAPS: 100.0000 mg | ORAL_CAPSULE | Freq: Two times a day (BID) | ORAL | Status: DC

## 2014-02-10 MED ORDER — IPRATROPIUM BROMIDE 0.02 % IN SOLN
0.5000 mg | Freq: Once | RESPIRATORY_TRACT | Status: AC
  Administered 2014-02-10: 0.5 mg via RESPIRATORY_TRACT

## 2014-02-10 MED ORDER — ALBUTEROL SULFATE (2.5 MG/3ML) 0.083% IN NEBU
2.5000 mg | INHALATION_SOLUTION | Freq: Once | RESPIRATORY_TRACT | Status: AC
  Administered 2014-02-10: 2.5 mg via RESPIRATORY_TRACT

## 2014-02-10 NOTE — Progress Notes (Signed)
Subjective:    Patient ID: Victoria Snyder, female    DOB: 1966-04-17, 48 y.o.   MRN: [id number] Chief Complaint  Patient presents with  . Follow-up    on cough on 02/06/14--still coughing mainly at night    HPI  Finally starting to feel a little better. Has been off the zpack for several days.  Past Medical History  Diagnosis Date  . Blood transfusion without reported diagnosis    Current Outpatient Prescriptions on File Prior to Visit  Medication Sig Dispense Refill  . Ascorbic Acid (VITAMIN C) 1000 MG tablet Take 1,000 mg by mouth daily.      . butalbital-acetaminophen-caffeine (FIORICET) 50-325-40 MG per tablet Take 1-2 tablets by mouth every 6 (six) hours as needed for headache.  20 tablet  0  . chlorpheniramine-HYDROcodone (TUSSIONEX PENNKINETIC ER) 10-8 MG/5ML LQCR Take 5 mLs by mouth every 12 (twelve) hours as needed.  120 mL  0  . fluticasone (FLONASE) 50 MCG/ACT nasal spray Place 2 sprays into both nostrils at bedtime.  16 g  2  . Guaifenesin (MUCINEX MAXIMUM STRENGTH) 1200 MG TB12 Take 1 tablet (1,200 mg total) by mouth every 12 (twelve) hours as needed.  14 tablet  1  . promethazine (PHENERGAN) 25 MG tablet Take 1 tablet (25 mg total) by mouth every 8 (eight) hours as needed for nausea or vomiting.  20 tablet  0  . Multiple Vitamins-Minerals (MULTIVITAMIN WITH MINERALS) tablet Take 1 tablet by mouth daily.      . polyethylene glycol (MIRALAX / GLYCOLAX) packet Take 17 g by mouth daily.  14 each  0   No current facility-administered medications on file prior to visit.   No Known Allergies    Review of Systems  Constitutional: Positive for fever, chills, diaphoresis, activity change, appetite change and fatigue.  HENT: Positive for congestion, postnasal drip, rhinorrhea and sore throat. Negative for ear discharge, ear pain, mouth sores, nosebleeds, sinus pressure, sneezing, trouble swallowing and voice change.   Eyes: Negative for photophobia and pain.  Respiratory:  Positive for cough. Negative for shortness of breath and wheezing.   Cardiovascular: Negative for chest pain.  Gastrointestinal: Negative for nausea, vomiting, abdominal pain and constipation.  Genitourinary: Negative for dysuria, frequency and decreased urine volume.  Musculoskeletal: Positive for myalgias and neck stiffness. Negative for arthralgias, gait problem, joint swelling and neck pain.  Skin: Negative for rash.  Neurological: Positive for weakness, light-headedness and headaches. Negative for dizziness and syncope.  Hematological: Negative for adenopathy.  Psychiatric/Behavioral: Positive for sleep disturbance.       Objective:  BP 122/76  Pulse 82  Temp(Src) 98.1 F (36.7 C) (Oral)  Resp 18  Ht 5\' 9"  (1.753 m)  Wt 148 lb 9.6 oz (67.405 kg)  BMI 21.93 kg/m2  SpO2 99%  Physical Exam  Constitutional: She is oriented to person, place, and time. She appears well-developed and well-nourished. She appears lethargic. She appears ill. No distress.  HENT:  Head: Normocephalic and atraumatic.  Right Ear: Tympanic membrane, external ear and ear canal normal.  Left Ear: Tympanic membrane, external ear and ear canal normal.  Nose: Rhinorrhea present. No mucosal edema. Right sinus exhibits no maxillary sinus tenderness. Left sinus exhibits no maxillary sinus tenderness.  Mouth/Throat: Uvula is midline and mucous membranes are normal. Posterior oropharyngeal erythema present. No oropharyngeal exudate or posterior oropharyngeal edema.  Eyes: Conjunctivae are normal. Right eye exhibits no discharge. Left eye exhibits no discharge. No scleral icterus.  Neck: Normal range of  motion. Neck supple.  Cardiovascular: Normal rate, regular rhythm, normal heart sounds and intact distal pulses.   Pulmonary/Chest: Effort normal. Not tachypneic. No respiratory distress. She has no decreased breath sounds. She has no wheezes. She has rhonchi in the left upper field and the left lower field. She has no  rales.  Lymphadenopathy:    She has no cervical adenopathy.  Neurological: She is oriented to person, place, and time. She appears lethargic.  Skin: Skin is warm and dry. She is not diaphoretic. No erythema.  Psychiatric: She has a normal mood and affect. Her behavior is normal.          peak flow 375; s/p duoneb 425; predicted peak flow 480 Assessment & Plan:   Cough - Plan: methylPREDNISolone acetate (DEPO-MEDROL) injection 80 mg, albuterol (PROVENTIL) (2.5 MG/3ML) 0.083% nebulizer solution 2.5 mg, ipratropium (ATROVENT) nebulizer solution 0.5 mg  Acute bronchitis, unspecified organism - Will start doxycycline due to prolonged pulmonary sxs, try inhaled steroid - coupon given. Recheck w/ me in 4d if sxs not resolved by then.   Meds ordered this encounter  Medications  . methylPREDNISolone acetate (DEPO-MEDROL) injection 80 mg    Sig:   . albuterol (PROVENTIL) (2.5 MG/3ML) 0.083% nebulizer solution 2.5 mg    Sig:   . ipratropium (ATROVENT) nebulizer solution 0.5 mg    Sig:   . doxycycline (VIBRAMYCIN) 100 MG capsule    Sig: Take 1 capsule (100 mg total) by mouth 2 (two) times daily.    Dispense:  14 capsule    Refill:  0  . albuterol (PROVENTIL HFA;VENTOLIN HFA) 108 (90 BASE) MCG/ACT inhaler    Sig: Inhale 2 puffs into the lungs every 4 (four) hours as needed for wheezing or shortness of breath.    Dispense:  1 Inhaler    Refill:  0    Delman Cheadle, MD MPH

## 2014-02-10 NOTE — Patient Instructions (Signed)

## 2015-07-13 ENCOUNTER — Ambulatory Visit (INDEPENDENT_AMBULATORY_CARE_PROVIDER_SITE_OTHER): Payer: Self-pay | Admitting: Family Medicine

## 2015-07-13 VITALS — BP 118/80 | HR 79 | Temp 99.2°F | Resp 16 | Ht 69.0 in | Wt 170.0 lb

## 2015-07-13 DIAGNOSIS — R52 Pain, unspecified: Secondary | ICD-10-CM

## 2015-07-13 DIAGNOSIS — R05 Cough: Secondary | ICD-10-CM

## 2015-07-13 DIAGNOSIS — J988 Other specified respiratory disorders: Secondary | ICD-10-CM

## 2015-07-13 DIAGNOSIS — R059 Cough, unspecified: Secondary | ICD-10-CM

## 2015-07-13 DIAGNOSIS — J22 Unspecified acute lower respiratory infection: Secondary | ICD-10-CM

## 2015-07-13 DIAGNOSIS — R6889 Other general symptoms and signs: Secondary | ICD-10-CM

## 2015-07-13 LAB — POCT URINALYSIS DIP (MANUAL ENTRY)
Bilirubin, UA: NEGATIVE
Blood, UA: NEGATIVE
Glucose, UA: NEGATIVE
Leukocytes, UA: NEGATIVE
Nitrite, UA: NEGATIVE
Protein Ur, POC: NEGATIVE
Spec Grav, UA: >=1.03
Urobilinogen, UA: 0.2
pH, UA: 5.5

## 2015-07-13 LAB — POCT CBC
Granulocyte percent: 48.9 %G (ref 37–80)
HCT, POC: 43.9 % (ref 37.7–47.9)
Hemoglobin: 15.5 g/dL (ref 12.2–16.2)
Lymph, poc: 1.9 (ref 0.6–3.4)
MCH, POC: 31.9 pg — AB (ref 27–31.2)
MCHC: 35.4 g/dL (ref 31.8–35.4)
MCV: 90.1 fL (ref 80–97)
MID (cbc): 0.2 (ref 0–0.9)
MPV: 6.5 fL (ref 0–99.8)
POC Granulocyte: 2 (ref 2–6.9)
POC LYMPH PERCENT: 45.6 %L (ref 10–50)
POC MID %: 5.5 %M (ref 0–12)
Platelet Count, POC: 267 10*3/uL (ref 142–424)
RBC: 4.87 M/uL (ref 4.04–5.48)
RDW, POC: 12.7 %
WBC: 4.1 10*3/uL — AB (ref 4.6–10.2)

## 2015-07-13 LAB — POC MICROSCOPIC URINALYSIS (UMFC)

## 2015-07-13 MED ORDER — HYDROCODONE-HOMATROPINE 5-1.5 MG/5ML PO SYRP: 5.0000 mL | ORAL_SOLUTION | Freq: Every evening | ORAL | Status: DC | PRN

## 2015-07-13 MED ORDER — AZITHROMYCIN 250 MG PO TABS: ORAL_TABLET | ORAL | Status: DC

## 2015-07-13 NOTE — Progress Notes (Signed)
Chief Complaint:  Chief Complaint  Patient presents with  . Headache    x 4 days   . Generalized Body Aches  . Cough    HPI: Victoria Snyder is a 50 y.o. female who reports to [facility] today complaining of 4 day history of headache after she went out to go to church, felt  Something was coming on  about 1 hour after she showered, she took [facility] powder  and theraflu and vicks, felt worse on Tuesday. She has msk aches and pains. She felt aches and pains everywhere on Tuesday, she ahs a dry cough/No ear pain or facial pain. She is not coughing too much, she has mostly msk aches and pain, she has had hot flashes so she does not if chills are new but hse has ahd hotflashes for the  know fo rthe last 1-2 months. When hse is layoing down she feel ssoreness, bit when she is upo she feels off balance.  She has not  been drinking a lot of fluids, no UTI sxs.  Has had a hx of PNA and is worried about this.   Past Medical History  Diagnosis Date  . Blood transfusion without reported diagnosis    Past Surgical History  Procedure Laterality Date  . Abdominal hysterectomy     Social History   Social History  . Marital Status: Married    Spouse Name: N/A  . Number of Children: N/A  . Years of Education: N/A   Social History Main Topics  . Smoking status: Never Smoker   . Smokeless tobacco: Never Used  . Alcohol Use: 0.6 - 1.2 oz/week    1-2 Standard drinks or equivalent per week  . Drug Use: No  . Sexual Activity: Yes   Other Topics Concern  . None   Social History Narrative   Family History  Problem Relation Age of Onset  . Heart disease Mother     heart attack  . Stroke Father    No Known Allergies Prior to Admission medications   Medication Sig Start Date End Date Taking? Authorizing Provider  Ascorbic Acid (VITAMIN C) 1000 MG tablet Take 1,000 mg by mouth daily.   Yes Historical Provider, MD  Multiple Vitamins-Minerals (MULTIVITAMIN WITH MINERALS) tablet Take 1 tablet by  mouth daily.   Yes Historical Provider, MD     ROS: The patient denies fevers, chills, night sweats, unintentional weight loss, chest pain, palpitations, wheezing, dyspnea on exertion, nausea, vomiting, abdominal pain, dysuria, hematuria, melena, numbness, weakness, or tingling.   All other systems have been reviewed and were otherwise negative with the exception of those mentioned in the HPI and as above.    PHYSICAL EXAM: Filed Vitals:   07/13/15 1413  BP: 118/80  Pulse: 79  Temp: 99.2 F (37.3 C)  Resp: 16   Body mass index is 25.09 kg/(m^2).   General: Alert, no acute distress HEENT:  Normocephalic, atraumatic, oropharynx patent. EOMI, PERRLA Erythematous throat, no exudates, TM normal, + sinus tenderness, + erythematous/boggy nasal mucosa\ Cardiovascular:  Regular rate and rhythm, no rubs murmurs or gallops.  No Carotid bruits, radial pulse intact. No pedal edema.  Respiratory: Clear to auscultation bilaterally.  No wheezes, rales, or rhonchi.  No cyanosis, no use of accessory musculature Abdominal: No organomegaly, abdomen is soft and non-tender, positive bowel sounds. No masses. Skin: No rashes. Neurologic: Facial musculature symmetric. Psychiatric: Patient acts appropriately throughout our interaction. Lymphatic: No cervical or submandibular lymphadenopathy Musculoskeletal: Gait intact.  No edema, tenderness   LABS: Results for orders placed or performed in visit on 07/13/15  POCT CBC  Result Value Ref Range   WBC 4.1 (A) 4.6 - 10.2 K/uL   Lymph, poc 1.9 0.6 - 3.4   POC LYMPH PERCENT 45.6 10 - 50 %L   MID (cbc) 0.2 0 - 0.9   POC MID % 5.5 0 - 12 %M   POC Granulocyte 2.0 2 - 6.9   Granulocyte percent 48.9 37 - 80 %G   RBC 4.87 4.04 - 5.48 M/uL   Hemoglobin 15.5 12.2 - 16.2 g/dL   HCT, POC 43.9 37.7 - 47.9 %   MCV 90.1 80 - 97 fL   MCH, POC 31.9 (A) 27 - 31.2 pg   MCHC 35.4 31.8 - 35.4 g/dL   RDW, POC 12.7 %   Platelet Count, POC 267 142 - 424 K/uL   MPV 6.5 0  - 99.8 fL  POCT urinalysis dipstick  Result Value Ref Range   Color, UA yellow yellow   Clarity, UA cloudy (A) clear   Glucose, UA negative negative   Bilirubin, UA negative negative   Ketones, POC UA trace (5) (A) negative   Spec Grav, UA >=1.030    Blood, UA negative negative   pH, UA 5.5    Protein Ur, POC negative negative   Urobilinogen, UA 0.2    Nitrite, UA Negative Negative   Leukocytes, UA Negative Negative  POCT Microscopic Urinalysis ([facility])  Result Value Ref Range   WBC,UR,HPF,POC None None WBC/hpf   RBC,UR,HPF,POC None None RBC/hpf   Bacteria None None, Too numerous to count   Mucus Present (A) Absent   Epithelial Cells, UR Per Microscopy Moderate (A) None, Too numerous to count cells/hpf     EKG/XRAY:   Primary read interpreted by Dr. Marin Comment at [facility].   ASSESSMENT/PLAN: Encounter Diagnoses  Name Primary?  . Cough   . Flu-like symptoms Yes  . Body aches   . Lower respiratory infection (e.g., bronchitis, pneumonia, pneumonitis, pulmonitis)    z pack Hycodan prn [ush fluids Fu prn   Gross sideeffects, risk and benefits, and alternatives of medications d/w patient. Patient is aware that all medications have potential sideeffects and we are unable to predict every sideeffect or drug-drug interaction that may occur.  Isabelle Matt DO  07/13/2015 8:33 PM

## 2016-01-04 ENCOUNTER — Ambulatory Visit: Payer: Self-pay

## 2016-02-06 ENCOUNTER — Emergency Department (HOSPITAL_COMMUNITY)
Admission: EM | Admit: 2016-02-06 | Discharge: 2016-02-06 | Disposition: A | Discharge status: Home / Self Care | Payer: Self-pay | Attending: Emergency Medicine | Admitting: Emergency Medicine

## 2016-02-06 ENCOUNTER — Encounter (HOSPITAL_COMMUNITY): Payer: Self-pay | Admitting: Emergency Medicine

## 2016-02-06 ENCOUNTER — Emergency Department (HOSPITAL_COMMUNITY): Payer: Self-pay

## 2016-02-06 DIAGNOSIS — R0789 Other chest pain: Secondary | ICD-10-CM | POA: Insufficient documentation

## 2016-02-06 DIAGNOSIS — R079 Chest pain, unspecified: Secondary | ICD-10-CM

## 2016-02-06 DIAGNOSIS — Z87891 Personal history of nicotine dependence: Secondary | ICD-10-CM | POA: Insufficient documentation

## 2016-02-06 LAB — I-STAT TROPONIN, ED: Troponin i, poc: 0 ng/mL (ref 0.00–0.08)

## 2016-02-06 LAB — CBC
HCT: 42.8 % (ref 36.0–46.0)
Hemoglobin: 14.7 g/dL (ref 12.0–15.0)
MCH: 31.5 pg (ref 26.0–34.0)
MCHC: 34.3 g/dL (ref 30.0–36.0)
MCV: 91.8 fL (ref 78.0–100.0)
Platelets: 219 10*3/uL (ref 150–400)
RBC: 4.66 MIL/uL (ref 3.87–5.11)
RDW: 12.8 % (ref 11.5–15.5)
WBC: 3.3 10*3/uL — ABNORMAL LOW (ref 4.0–10.5)

## 2016-02-06 LAB — BASIC METABOLIC PANEL
Anion gap: 6 (ref 5–15)
BUN: 7 mg/dL (ref 6–20)
CO2: 29 mmol/L (ref 22–32)
Calcium: 9.6 mg/dL (ref 8.9–10.3)
Chloride: 104 mmol/L (ref 101–111)
Creatinine, Ser: 0.79 mg/dL (ref 0.44–1.00)
GFR calc Af Amer: >60 mL/min (ref >60)
GFR calc non Af Amer: >60 mL/min (ref >60)
Glucose, Bld: 99 mg/dL (ref 65–99)
Potassium: 4 mmol/L (ref 3.5–5.1)
Sodium: 139 mmol/L (ref 135–145)

## 2016-02-06 LAB — D-DIMER, QUANTITATIVE: D-Dimer, Quant: <0.27 ug/mL-FEU (ref 0.00–0.50)

## 2016-02-06 MED ORDER — KETOROLAC TROMETHAMINE 30 MG/ML IJ SOLN
30.0000 mg | Freq: Once | INTRAMUSCULAR | Status: AC
  Administered 2016-02-06: 30 mg via INTRAVENOUS
  Filled 2016-02-06: qty 1

## 2016-02-06 NOTE — ED Provider Notes (Signed)
[facility] DEPT Provider Note   CSN: [id number] Arrival date & time: 02/06/16  [id number]     History   Chief Complaint Chief Complaint  Patient presents with  . Chest Pain    HPI Victoria Snyder is a 50 y.o. female.  Patient with no significant past medical history presents with left-sided chest pain. She states it started this morning. It got worse she was at work. It's a sharp pain to her left chest it radiates to her left side. It's worse with breathing and worse with certain movements. She denies any associated shortness of breath. She's had a little bit of coughing and some mild URI symptoms. No fevers. No leg pain or swelling. No fatigue. No nausea or vomiting. No diaphoresis. No exertional symptoms. She is not taking anything at home for the pain.    Chest Pain   Pertinent negatives include no abdominal pain, no back pain, no cough, no diaphoresis, no dizziness, no fever, no headaches, no nausea, no numbness, no shortness of breath, no vomiting and no weakness.    Past Medical History:  Diagnosis Date  . Blood transfusion without reported diagnosis     There are no active problems to display for this patient.   Past Surgical History:  Procedure Laterality Date  . ABDOMINAL HYSTERECTOMY      OB History    No data available       Home Medications    Prior to Admission medications   Medication Sig Start Date End Date Taking? Authorizing Provider  Ascorbic Acid (VITAMIN C) 1000 MG tablet Take 1,000 mg by mouth daily.    Historical Provider, MD  azithromycin (ZITHROMAX) 250 MG tablet Take 2 tabs po now then 1 tab po daily for 4 days 07/13/15   Thao P Le, DO  HYDROcodone-homatropine (HYCODAN) 5-1.5 MG/5ML syrup Take 5 mLs by mouth at bedtime as needed. 07/13/15   Thao P Le, DO  Multiple Vitamins-Minerals (MULTIVITAMIN WITH MINERALS) tablet Take 1 tablet by mouth daily.    Historical Provider, MD    Family History Family History  Problem Relation Age of Onset  .  Heart disease Mother     heart attack  . Stroke Father     Social History Social History  Substance Use Topics  . Smoking status: Former Research scientist (life sciences)  . Smokeless tobacco: Never Used  . Alcohol use 0.6 - 1.2 oz/week    1 - 2 Standard drinks or equivalent per week     Allergies   Review of patient's allergies indicates no known allergies.   Review of Systems Review of Systems  Constitutional: Negative for chills, diaphoresis, fatigue and fever.  HENT: Negative for congestion, rhinorrhea and sneezing.   Eyes: Negative.   Respiratory: Negative for cough, chest tightness and shortness of breath.   Cardiovascular: Positive for chest pain. Negative for leg swelling.  Gastrointestinal: Negative for abdominal pain, blood in stool, diarrhea, nausea and vomiting.  Genitourinary: Negative for difficulty urinating, flank pain, frequency and hematuria.  Musculoskeletal: Negative for arthralgias and back pain.  Skin: Negative for rash.  Neurological: Negative for dizziness, speech difficulty, weakness, numbness and headaches.     Physical Exam Updated Vital Signs BP 131/98   Pulse 67   Temp 98.4 F (36.9 C) (Oral)   Resp 19   SpO2 99%   Physical Exam  Constitutional: She is oriented to person, place, and time. She appears well-developed and well-nourished.  HENT:  Head: Normocephalic and atraumatic.  Eyes: Pupils are equal,  round, and reactive to light.  Neck: Normal range of motion. Neck supple.  Cardiovascular: Normal rate, regular rhythm and normal heart sounds.   Pulmonary/Chest: Effort normal and breath sounds normal. No respiratory distress. She has no wheezes. She has no rales. She exhibits tenderness (Moderate tenderness in the anterior left chest wall, no rashes).  Abdominal: Soft. Bowel sounds are normal. There is no tenderness. There is no rebound and no guarding.  Musculoskeletal: Normal range of motion. She exhibits no edema.  No edema or calf tenderness    Lymphadenopathy:    She has no cervical adenopathy.  Neurological: She is alert and oriented to person, place, and time.  Skin: Skin is warm and dry. No rash noted.  Psychiatric: She has a normal mood and affect.     ED Treatments / Results  Labs (all labs ordered are listed, but only abnormal results are displayed) Labs Reviewed  CBC - Abnormal; Notable for the following:       Result Value   WBC 3.3 (*)    All other components within normal limits  BASIC METABOLIC PANEL  D-DIMER, QUANTITATIVE (NOT AT [facility])  Randolm Idol, ED    EKG  EKG Interpretation  Date/Time:  Tuesday February 06 2016 09:58:27 EDT Ventricular Rate:  83 PR Interval:  138 QRS Duration: 80 QT Interval:  364 QTC Calculation: 427 R Axis:   64 Text Interpretation:  Normal sinus rhythm Biatrial enlargement Abnormal ECG No old tracing to compare Confirmed by Alivya Wegman  MD, Temprence Rhines ([id number]) on 02/06/2016 11:41:31 AM       Radiology Dg Chest 2 View  Result Date: 02/06/2016 CLINICAL DATA:  LEFT anterior and posterior chest pain beginning this morning, former smoker EXAM: CHEST  2 VIEW COMPARISON:  02/06/2014 FINDINGS: Normal heart size, mediastinal contours, and pulmonary vascularity. Mild peribronchial thickening. No pulmonary infiltrate, pleural effusion, or pneumothorax. Bones unremarkable. IMPRESSION: Bronchitic changes without infiltrate. Electronically Signed   By: Lavonia Dana M.D.   On: 02/06/2016 10:52    Procedures Procedures (including critical care time)  Medications Ordered in ED Medications  ketorolac (TORADOL) 30 MG/ML injection 30 mg (30 mg Intravenous Given 02/06/16 1210)     Initial Impression / Assessment and Plan / ED Course  I have reviewed the triage vital signs and the nursing notes.  Pertinent labs & imaging results that were available during my care of the patient were reviewed by me and considered in my medical decision making (see chart for details).  Clinical Course     Patient presents with left-sided chest pain. It is reproducible on palpation. Her chest x-ray is non-concerning. Her d-dimer is normal without evidence of pulmonary embolus. She was given dose of Toradol with almost complete resolution in symptoms. Her EKG doesn't show any ischemic changes. Her troponin is negative. I I feel that this is likely musculoskeletal in nature. She was discharged home in good condition. She was encouraged to follow-up with her PCP if her symptoms are not improving or return here as needed for any worsening symptoms.  Final Clinical Impressions(s) / ED Diagnoses   Final diagnoses:  Chest pain, unspecified chest pain type    New Prescriptions New Prescriptions   No medications on file     Malvin Johns, MD 02/06/16 1306

## 2016-02-06 NOTE — ED Triage Notes (Signed)
Pt from home with c/p pain under bilateral breasts starting this morning wrapping around to her left side worse with movement and bending. Pt denies any injury, noted it was worse when she was at work.  NAD, A&O.

## 2016-02-06 NOTE — ED Notes (Signed)
Pt able to take deep breaths without the pain she had prior to coming to ED.

## 2017-10-22 ENCOUNTER — Ambulatory Visit: Payer: BLUE CROSS/BLUE SHIELD | Admitting: Physician Assistant

## 2017-10-22 ENCOUNTER — Other Ambulatory Visit: Payer: Self-pay

## 2017-10-22 ENCOUNTER — Encounter: Payer: Self-pay | Admitting: Physician Assistant

## 2017-10-22 VITALS — Temp 98.6°F | Ht 69.0 in | Wt 170.0 lb

## 2017-10-22 DIAGNOSIS — R42 Dizziness and giddiness: Secondary | ICD-10-CM | POA: Diagnosis not present

## 2017-10-22 DIAGNOSIS — F411 Generalized anxiety disorder: Secondary | ICD-10-CM

## 2017-10-22 DIAGNOSIS — R9431 Abnormal electrocardiogram [ECG] [EKG]: Secondary | ICD-10-CM | POA: Diagnosis not present

## 2017-10-22 LAB — CMP14+EGFR
ALT: 20 IU/L (ref 0–32)
AST: 20 IU/L (ref 0–40)
Albumin/Globulin Ratio: 1.8 (ref 1.2–2.2)
Albumin: 4.9 g/dL (ref 3.5–5.5)
Alkaline Phosphatase: 87 IU/L (ref 39–117)
BUN/Creatinine Ratio: 15 (ref 9–23)
BUN: 14 mg/dL (ref 6–24)
Bilirubin Total: 0.6 mg/dL (ref 0.0–1.2)
CO2: 25 mmol/L (ref 20–29)
Calcium: 10.4 mg/dL — ABNORMAL HIGH (ref 8.7–10.2)
Chloride: 101 mmol/L (ref 96–106)
Creatinine, Ser: 0.95 mg/dL (ref 0.57–1.00)
GFR calc Af Amer: 79 mL/min/{1.73_m2} (ref >59)
GFR calc non Af Amer: 69 mL/min/{1.73_m2} (ref >59)
Globulin, Total: 2.7 g/dL (ref 1.5–4.5)
Glucose: 97 mg/dL (ref 65–99)
Potassium: 4.5 mmol/L (ref 3.5–5.2)
Sodium: 140 mmol/L (ref 134–144)
Total Protein: 7.6 g/dL (ref 6.0–8.5)

## 2017-10-22 LAB — POCT URINALYSIS DIP (MANUAL ENTRY)
Bilirubin, UA: NEGATIVE
Blood, UA: NEGATIVE
Glucose, UA: NEGATIVE mg/dL
Ketones, POC UA: NEGATIVE mg/dL
Leukocytes, UA: NEGATIVE
Nitrite, UA: NEGATIVE
Protein Ur, POC: NEGATIVE mg/dL
Spec Grav, UA: 1.01 (ref 1.010–1.025)
Urobilinogen, UA: 1 E.U./dL
pH, UA: 6.5 (ref 5.0–8.0)

## 2017-10-22 LAB — POCT CBC
Granulocyte percent: 54 %G (ref 37–80)
HCT, POC: 45.7 % (ref 37.7–47.9)
Hemoglobin: 15.3 g/dL (ref 12.2–16.2)
Lymph, poc: 2.2 (ref 0.6–3.4)
MCH, POC: 31 pg (ref 27–31.2)
MCHC: 33.5 g/dL (ref 31.8–35.4)
MCV: 92.5 fL (ref 80–97)
MID (cbc): 0.4 (ref 0–0.9)
MPV: 6.8 fL (ref 0–99.8)
POC Granulocyte: 3.1 (ref 2–6.9)
POC LYMPH PERCENT: 38.2 %L (ref 10–50)
POC MID %: 7.8 %M (ref 0–12)
Platelet Count, POC: 303 10*3/uL (ref 142–424)
RBC: 4.94 M/uL (ref 4.04–5.48)
RDW, POC: 12.5 %
WBC: 5.7 10*3/uL (ref 4.6–10.2)

## 2017-10-22 MED ORDER — BUSPIRONE HCL 10 MG PO TABS: 10.0000 mg | ORAL_TABLET | Freq: Two times a day (BID) | ORAL | 1 refills | Status: DC | PRN

## 2017-10-22 NOTE — Patient Instructions (Addendum)
Cardiologist office will call you to set up an appointment.  In the meantime but try some anxiousness medication given your symptoms of anxiety.  If any point you develop chest pain, shortness of breath or weakness in your extremities please present to the ED.    IF you received an x-ray today, you will receive an invoice from [facility]. Please contact [facility] at [phone] with questions or concerns regarding your invoice.   IF you received labwork today, you will receive an invoice from [facility]. Please contact [facility] at [phone] with questions or concerns regarding your invoice.   Our billing staff will not be able to assist you with questions regarding bills from these companies.  You will be contacted with the lab results as soon as they are available. The fastest way to get your results is to activate your My Chart account. Instructions are located on the last page of this paperwork. If you have not heard from [place] regarding the results in 2 weeks, please contact this office.

## 2017-10-22 NOTE — Progress Notes (Signed)
10/22/2017 10:56 AM   DOB: 09-21-1965 / MRN: [id number]  SUBJECTIVE:  Victoria Snyder is a 52 y.o. female presenting for dizziness.  In describing the problem she tells me that she is "just not feeling quite right."  Denies any palpitations, chest pain, shortness of breath, diaphoresis, nausea, disproportionate DOE.  Tells me her mother had a massive heart attack at age 31.  She is a former smoker.  She feels well at this time.Over the last 2 weeks several of her friends of the family have passed and notably her best friend's son recently died.  She had a difficult time "turning off my mind."  She has no history of hypertension, diabetes.  She takes no prescription medications.  She is not opposed to seeing cardiology.   She has No Known Allergies.   She  has a past medical history of Blood transfusion without reported diagnosis.    She  reports that she has quit smoking. She has never used smokeless tobacco. She reports that she drinks about 0.6 - 1.2 oz of alcohol per week. She reports that she does not use drugs. She  reports that she currently engages in sexual activity. The patient  has a past surgical history that includes Abdominal hysterectomy.  Her family history includes Heart disease in her mother; Stroke in her father.  Review of Systems  Constitutional: Negative for chills, diaphoresis and fever.  Eyes: Negative.   Respiratory: Negative for cough, hemoptysis, sputum production, shortness of breath and wheezing.   Cardiovascular: Negative for chest pain, orthopnea and leg swelling.  Gastrointestinal: Negative for abdominal pain, diarrhea, nausea and vomiting.  Genitourinary: Negative for dysuria, flank pain, frequency, hematuria and urgency.  Musculoskeletal: Negative for myalgias.  Skin: Negative for rash.  Neurological: Positive for dizziness. Negative for tingling, tremors, sensory change, speech change, focal weakness, seizures, loss of consciousness, weakness and  headaches.  Psychiatric/Behavioral: Negative for depression, hallucinations, memory loss, substance abuse and suicidal ideas. The patient is nervous/anxious. The patient does not have insomnia.     The problem list and medications were reviewed and updated by myself where necessary and exist elsewhere in the encounter.   OBJECTIVE:  Temp 98.6 F (37 C) (Oral)   Ht 5' 9"  (1.753 m)   Wt 170 lb (77.1 kg)   SpO2 99%   BMI 25.10 kg/m   Physical Exam  Constitutional: She is oriented to person, place, and time. She appears well-nourished. No distress.  Eyes: Pupils are equal, round, and reactive to light. EOM are normal.  Cardiovascular: Normal rate, regular rhythm, S1 normal, S2 normal, normal heart sounds and intact distal pulses. Exam reveals no gallop, no friction rub and no decreased pulses.  No murmur heard.  No systolic murmur is present.  No diastolic murmur is present. Pulmonary/Chest: Effort normal. No stridor. No respiratory distress. She has no wheezes. She has no rales.  Abdominal: She exhibits no distension.  Musculoskeletal: She exhibits no edema, tenderness or deformity.  Neurological: She is alert and oriented to person, place, and time. No cranial nerve deficit. Gait normal.  Negative finger-to-nose.  Rapid alternating movements, heel toe strength and heel toe gait all normal.  Cranial nerves intact to challenge.  Negative Romberg negative pronator drift.  Skin: Skin is dry. She is not diaphoretic.  Psychiatric: Her speech is normal and behavior is normal. Her mood appears not anxious. Her affect is not angry, not blunt, not labile and not inappropriate. She is not agitated, not aggressive, not  hyperactive, not slowed, not withdrawn and not combative. She exhibits a depressed mood.  Vitals reviewed.   Results for orders placed or performed in visit on 10/22/17 (from the past 72 hour(s))  POCT CBC     Status: None   Collection Time: 10/22/17 10:31 AM  Result Value Ref  Range   WBC 5.7 4.6 - 10.2 K/uL   Lymph, poc 2.2 0.6 - 3.4   POC LYMPH PERCENT 38.2 10 - 50 %L   MID (cbc) 0.4 0 - 0.9   POC MID % 7.8 0 - 12 %M   POC Granulocyte 3.1 2 - 6.9   Granulocyte percent 54.0 37 - 80 %G   RBC 4.94 4.04 - 5.48 M/uL   Hemoglobin 15.3 12.2 - 16.2 g/dL   HCT, POC 45.7 37.7 - 47.9 %   MCV 92.5 80 - 97 fL   MCH, POC 31.0 27 - 31.2 pg   MCHC 33.5 31.8 - 35.4 g/dL   RDW, POC 12.5 %   Platelet Count, POC 303 142 - 424 K/uL   MPV 6.8 0 - 99.8 fL  POCT urinalysis dipstick     Status: None   Collection Time: 10/22/17 10:32 AM  Result Value Ref Range   Color, UA yellow yellow   Clarity, UA clear clear   Glucose, UA negative negative mg/dL   Bilirubin, UA negative negative   Ketones, POC UA negative negative mg/dL   Spec Grav, UA 1.010 1.010 - 1.025   Blood, UA negative negative   pH, UA 6.5 5.0 - 8.0   Protein Ur, POC negative negative mg/dL   Urobilinogen, UA 1.0 0.2 or 1.0 E.U./dL   Nitrite, UA Negative Negative   Leukocytes, UA Negative Negative   Orthostatic VS for the past 24 hrs:  BP- Lying Pulse- Lying BP- Sitting Pulse- Sitting BP- Standing at 0 minutes Pulse- Standing at 0 minutes  10/22/17 1019 149/90 79 (!) 165/91 90 (!) 154/93 97  10/22/17 1003 149/90 79 (!) 161/91 90 (!) 154/93 97   Lab Results  Component Value Date   WBC 5.7 10/22/2017   HGB 15.3 10/22/2017   HCT 45.7 10/22/2017   MCV 92.5 10/22/2017   PLT 219 02/06/2016    Lab Results  Component Value Date   CREATININE 0.79 02/06/2016   BUN 7 02/06/2016   NA 139 02/06/2016   K 4.0 02/06/2016   CL 104 02/06/2016   CO2 29 02/06/2016    Lab Results  Component Value Date   ALT 26 09/25/2013   AST 22 09/25/2013   ALKPHOS 70 09/25/2013   BILITOT 1.1 09/25/2013       No results found.  ASSESSMENT AND PLAN:  [name] was seen today for dizziness.  Diagnoses and all orders for this visit:  Dizziness given the abnormality on EKG it would be wise to have her evaluated by  cards.  She is willing to go.  I will be able to calculate and ASCVD in the next few days.  ED precautions discussed and documented on AVS. -     Orthostatic vital signs -     EKG 12-Lead -     Cancel: COMPLETE METABOLIC PANEL WITH GFR -     POCT CBC -     POCT urinalysis dipstick -     CMP14+EGFR -     Lipid Panel -     Hemoglobin A1c -     TSH  Shortened PR interval -     Ambulatory referral  to Cardiology  Anxiety state: See HPI.  RTC in the next 2 to 3 weeks for recheck. -     busPIRone (BUSPAR) 10 MG tablet; Take 1 tablet (10 mg total) by mouth 2 (two) times daily as needed.    The patient is advised to call or return to clinic if she does not see an improvement in symptoms, or to seek the care of the closest emergency department if she worsens with the above plan.   Philis Fendt, MHS, PA-C [facility] 10/22/2017 10:56 AM

## 2017-10-23 LAB — LIPID PANEL
Chol/HDL Ratio: 2.6 ratio (ref 0.0–4.4)
Cholesterol, Total: 203 mg/dL — ABNORMAL HIGH (ref 100–199)
HDL: 79 mg/dL (ref >39)
LDL Calculated: 99 mg/dL (ref 0–99)
Triglycerides: 123 mg/dL (ref 0–149)
VLDL Cholesterol Cal: 25 mg/dL (ref 5–40)

## 2017-10-23 LAB — TSH: TSH: 1.84 u[IU]/mL (ref 0.450–4.500)

## 2017-10-23 LAB — HEMOGLOBIN A1C
Est. average glucose Bld gHb Est-mCnc: 103 mg/dL
Hgb A1c MFr Bld: 5.2 % (ref 4.8–5.6)

## 2017-10-29 ENCOUNTER — Ambulatory Visit: Payer: BLUE CROSS/BLUE SHIELD | Admitting: Physician Assistant

## 2017-10-29 ENCOUNTER — Encounter: Payer: Self-pay | Admitting: Physician Assistant

## 2017-10-29 VITALS — BP 164/82 | HR 87 | Temp 98.5°F | Resp 17 | Ht 69.0 in | Wt 165.0 lb

## 2017-10-29 DIAGNOSIS — F411 Generalized anxiety disorder: Secondary | ICD-10-CM | POA: Diagnosis not present

## 2017-10-29 MED ORDER — HYDROXYZINE HCL 10 MG PO TABS: 10.0000 mg | ORAL_TABLET | Freq: Three times a day (TID) | ORAL | 0 refills | Status: DC | PRN

## 2017-10-29 NOTE — Patient Instructions (Addendum)
     IF you received an x-ray today, you will receive an invoice from [facility]. Please contact [facility] at [phone] with questions or concerns regarding your invoice.   IF you received labwork today, you will receive an invoice from [facility]. Please contact [facility] at [phone] with questions or concerns regarding your invoice.   Our billing staff will not be able to assist you with questions regarding bills from these companies.  You will be contacted with the lab results as soon as they are available. The fastest way to get your results is to activate your My Chart account. Instructions are located on the last page of this paperwork. If you have not heard from us regarding the results in 2 weeks, please contact this office.     

## 2017-10-29 NOTE — Progress Notes (Signed)
10/29/2017 4:02 PM   DOB: 1965/06/12 / MRN: [id number]  SUBJECTIVE:  Victoria Snyder is a 52 y.o. female presenting for an extended work note.  I diagnosed her with anxiety and short PR interval.  I prescribed her Buspar which is helping some. She still has some difficulty with sleeping. The symptoms continue to wax and wane.   Depression screen PHQ 2/9 10/29/2017  Decreased Interest 0  Down, Depressed, Hopeless 0  PHQ - 2 Score 0     She has No Known Allergies.   She  has a past medical history of Blood transfusion without reported diagnosis.    She  reports that she has quit smoking. She has never used smokeless tobacco. She reports that she drinks about 0.6 - 1.2 oz of alcohol per week. She reports that she does not use drugs. She  reports that she currently engages in sexual activity. The patient  has a past surgical history that includes Abdominal hysterectomy.  Her family history includes Heart disease in her mother; Stroke in her father.  Review of Systems  Constitutional: Negative for chills, diaphoresis and fever.  Eyes: Negative.   Respiratory: Negative for cough, hemoptysis, sputum production, shortness of breath and wheezing.   Cardiovascular: Negative for chest pain.  Gastrointestinal: Negative for abdominal pain, blood in stool, constipation, diarrhea, heartburn, melena, nausea and vomiting.  Genitourinary: Negative for dysuria, flank pain, frequency, hematuria and urgency.  Skin: Negative for rash.  Neurological: Negative for dizziness, sensory change, speech change, focal weakness and headaches.    The problem list and medications were reviewed and updated by myself where necessary and exist elsewhere in the encounter.   OBJECTIVE:  BP (!) 164/82   Pulse 87   Temp 98.5 F (36.9 C) (Oral)   Resp 17   Ht 5\' 9"  (1.753 m)   Wt 165 lb (74.8 kg)   SpO2 98%   BMI 24.37 kg/m   BP Readings from Last 3 Encounters:  10/29/17 (!) 164/82  02/06/16 121/88  07/13/15  118/80     Physical Exam  Constitutional: She is oriented to person, place, and time. She appears well-nourished. No distress.  Eyes: Pupils are equal, round, and reactive to light. EOM are normal.  Cardiovascular: Normal rate, regular rhythm, normal heart sounds and intact distal pulses.  Pulmonary/Chest: Effort normal and breath sounds normal.  Abdominal: She exhibits no distension.  Neurological: She is alert and oriented to person, place, and time. No cranial nerve deficit. Gait normal.  Skin: Skin is dry. She is not diaphoretic.  Psychiatric: She has a normal mood and affect. Her mood appears not anxious. Her affect is not angry, not blunt, not labile and not inappropriate. She does not exhibit a depressed mood.  Vitals reviewed.   No results found for this or any previous visit (from the past 72 hour(s)).  No results found.  ASSESSMENT AND PLAN:  [name] was seen today for dizziness.  Diagnoses and all orders for this visit:  Anxiety state: She sleep poorly last night and thus called in.  I have given her hydroxazine and advised that she go back to work tomorrow.  -     hydrOXYzine (ATARAX/VISTARIL) 10 MG tablet; Take 1-3 tablets (10-30 mg total) by mouth 3 (three) times daily as needed.    The patient is advised to call or return to clinic if she does not see an improvement in symptoms, or to seek the care of the closest emergency department if she  worsens with the above plan.   Philis Fendt, MHS, PA-C [facility] 10/29/2017 4:02 PM

## 2017-11-06 ENCOUNTER — Telehealth: Payer: Self-pay

## 2017-11-06 ENCOUNTER — Ambulatory Visit: Payer: BLUE CROSS/BLUE SHIELD | Admitting: Family Medicine

## 2017-11-06 NOTE — Telephone Encounter (Signed)
Copied from [place] [id number]. Topic: Inquiry >> Nov 05, 2017  2:15 PM Mylinda Latina, NT wrote: Reason for CRM:  Amy calling from [employer] is requesting clinical information for patient Short term disability. Requesting a call back. CB # [phone] >> Nov 06, 2017  4:15 PM Marin Olp L wrote: Amy, calling to check on status of callback for clinical information to complete short term disability.  Message sent to [facility]

## 2017-11-07 NOTE — Telephone Encounter (Signed)
Please see below.

## 2017-11-07 NOTE — Telephone Encounter (Signed)
Amy from [place] [phone] called again.   She is needing diagnoses, dates, notes. This is for pt. To get her short term disability ins.

## 2017-11-08 ENCOUNTER — Ambulatory Visit: Payer: BLUE CROSS/BLUE SHIELD | Admitting: Physician Assistant

## 2017-11-18 ENCOUNTER — Encounter: Payer: Self-pay | Admitting: Gastroenterology

## 2017-11-23 ENCOUNTER — Other Ambulatory Visit: Payer: Self-pay | Admitting: Physician Assistant

## 2017-11-23 DIAGNOSIS — F411 Generalized anxiety disorder: Secondary | ICD-10-CM

## 2017-12-01 ENCOUNTER — Other Ambulatory Visit (HOSPITAL_COMMUNITY): Payer: Self-pay | Admitting: Cardiology

## 2017-12-01 ENCOUNTER — Other Ambulatory Visit (HOSPITAL_COMMUNITY): Payer: Self-pay | Admitting: Body Imaging

## 2017-12-01 DIAGNOSIS — R9439 Abnormal result of other cardiovascular function study: Secondary | ICD-10-CM

## 2017-12-02 ENCOUNTER — Other Ambulatory Visit: Payer: Self-pay | Admitting: Physician Assistant

## 2017-12-02 DIAGNOSIS — F411 Generalized anxiety disorder: Secondary | ICD-10-CM

## 2017-12-08 ENCOUNTER — Other Ambulatory Visit: Payer: Self-pay

## 2017-12-08 ENCOUNTER — Encounter: Payer: Self-pay | Admitting: Physician Assistant

## 2017-12-08 ENCOUNTER — Other Ambulatory Visit: Payer: Self-pay | Admitting: Physician Assistant

## 2017-12-08 ENCOUNTER — Ambulatory Visit: Payer: Self-pay | Admitting: Physician Assistant

## 2017-12-08 DIAGNOSIS — R42 Dizziness and giddiness: Secondary | ICD-10-CM

## 2017-12-08 DIAGNOSIS — F411 Generalized anxiety disorder: Secondary | ICD-10-CM

## 2017-12-08 MED ORDER — ESCITALOPRAM OXALATE 5 MG PO TABS: 5.0000 mg | ORAL_TABLET | Freq: Every day | ORAL | 3 refills | Status: DC

## 2017-12-08 MED ORDER — BUSPIRONE HCL 15 MG PO TABS: 15.0000 mg | ORAL_TABLET | Freq: Three times a day (TID) | ORAL | 0 refills | Status: DC

## 2017-12-08 MED ORDER — HYDROXYZINE HCL 10 MG PO TABS: 10.0000 mg | ORAL_TABLET | Freq: Three times a day (TID) | ORAL | 0 refills | Status: DC | PRN

## 2017-12-08 NOTE — Progress Notes (Signed)
12/09/2017 12:21 PM   DOB: 03-02-1966 / MRN: [id number]  SUBJECTIVE:  Victoria Snyder is a 52 y.o. female presenting for dizziness evaluation.  She did have an abnormal stress test with cardiology and more testing is in progress.  From what I can see in epic it appears that she was advised to take chlorthalidone daily.  She found a primary care provider in [place] and discussed the symptoms with her and was advised to pick up some meclizine from the pharmacy.  She was also sent to [facility] for further psychological evaluation.  Her dizziness is chronic at this point.  When I asked her to describe the dizziness she describes it more as an unsteady feeling they gets worse whenever she gets up and moves around.  She does not have chest pain with the dizziness.  She does not have shortness of breath at the dizziness.  She denies any chest pain or shortness of breath at this time.  She has No Known Allergies.   She  has a past medical history of Blood transfusion without reported diagnosis.    She  reports that she has quit smoking. She has never used smokeless tobacco. She reports that she drinks about 0.6 - 1.2 oz of alcohol per week. She reports that she does not use drugs. She  reports that she currently engages in sexual activity. The patient  has a past surgical history that includes Abdominal hysterectomy.  Her family history includes Heart disease in her mother; Stroke in her father.  Review of Systems  Constitutional: Negative for diaphoresis.  Eyes: Negative.   Respiratory: Negative for cough, hemoptysis, sputum production, shortness of breath and wheezing.   Cardiovascular: Negative for chest pain, orthopnea and leg swelling.  Gastrointestinal: Negative for nausea.  Neurological: Negative for dizziness, sensory change, speech change, focal weakness and headaches.  Psychiatric/Behavioral: The patient is nervous/anxious and has insomnia.     The problem list and medications were  reviewed and updated by myself where necessary and exist elsewhere in the encounter.   OBJECTIVE:  BP (!) 149/98   Pulse 68   Temp 99.3 F (37.4 C) (Oral)   Resp 18   Ht 5\' 9"  (1.753 m)   Wt 161 lb 3.2 oz (73.1 kg)   SpO2 98%   BMI 23.81 kg/m   Wt Readings from Last 3 Encounters:  12/08/17 161 lb 3.2 oz (73.1 kg)  10/29/17 165 lb (74.8 kg)  10/22/17 170 lb (77.1 kg)   Temp Readings from Last 3 Encounters:  12/08/17 99.3 F (37.4 C) (Oral)  10/29/17 98.5 F (36.9 C) (Oral)  10/22/17 98.6 F (37 C) (Oral)   BP Readings from Last 3 Encounters:  12/08/17 (!) 149/98  10/29/17 (!) 164/82  02/06/16 121/88   Pulse Readings from Last 3 Encounters:  12/08/17 68  10/29/17 87  02/06/16 70    Physical Exam  Constitutional: She is oriented to person, place, and time. She appears well-nourished.  Non-toxic appearance. No distress.  HENT:  Head: Normocephalic and atraumatic.  Right Ear: External ear normal.  Left Ear: External ear normal.  Nose: Nose normal.  Mouth/Throat: Oropharynx is clear and moist. No oropharyngeal exudate.  Eyes: Pupils are equal, round, and reactive to light. EOM are normal. Right eye exhibits no discharge. Left eye exhibits no discharge. No scleral icterus.  Neck: Normal range of motion.  Cardiovascular: Normal rate, regular rhythm, S1 normal, S2 normal, normal heart sounds and intact distal pulses. Exam reveals no  gallop, no friction rub and no decreased pulses.  No murmur heard. Pulmonary/Chest: Effort normal. No stridor. No respiratory distress. She has no wheezes. She has no rales.  Abdominal: She exhibits no distension.  Musculoskeletal: She exhibits no edema.  Lymphadenopathy:    She has no cervical adenopathy.  Neurological: She is alert and oriented to person, place, and time. She has normal strength and normal reflexes. She is not disoriented. She displays no atrophy and no tremor. No cranial nerve deficit or sensory deficit. She exhibits  normal muscle tone. She displays a negative Romberg sign. She displays no seizure activity. Coordination and gait normal. GCS eye subscore is 4. GCS verbal subscore is 5. GCS motor subscore is 6.  Cerebellar testing negative.  Dix Hallpike maneuver negative bilaterally.   Skin: Skin is warm and dry. She is not diaphoretic. No pallor.  Psychiatric: She has a normal mood and affect. Her behavior is normal.  Vitals reviewed.   Lab Results  Component Value Date   HGBA1C 5.2 10/22/2017    Lab Results  Component Value Date   WBC 5.7 10/22/2017   HGB 15.3 10/22/2017   HCT 45.7 10/22/2017   MCV 92.5 10/22/2017   PLT 219 02/06/2016    Lab Results  Component Value Date   CREATININE 0.95 10/22/2017   BUN 14 10/22/2017   NA 140 10/22/2017   K 4.5 10/22/2017   CL 101 10/22/2017   CO2 25 10/22/2017    Lab Results  Component Value Date   ALT 20 10/22/2017   AST 20 10/22/2017   ALKPHOS 87 10/22/2017   BILITOT 0.6 10/22/2017    Lab Results  Component Value Date   TSH 1.840 10/22/2017    Lab Results  Component Value Date   CHOL 203 (H) 10/22/2017   HDL 79 10/22/2017   LDLCALC 99 10/22/2017   TRIG 123 10/22/2017   CHOLHDL 2.6 10/22/2017     ASSESSMENT AND PLAN:  Shemiah was seen today for dizziness and depression.  Diagnoses and all orders for this visit:  Dizziness: Per patient cardiac workup is continuing.  Neurological exam is reassuring today however can't exclude a mass occupying lesion.  CT head placed.  Given the differential still includes somatization will add lexapro.  -     CT Head Wo Contrast; Future  Anxiety state -     escitalopram (LEXAPRO) 5 MG tablet; Take 1 tablet (5 mg total) by mouth daily. -     hydrOXYzine (ATARAX/VISTARIL) 10 MG tablet; Take 1-3 tablets (10-30 mg total) by mouth 3 (three) times daily as needed. -     busPIRone (BUSPAR) 15 MG tablet; Take 1 tablet (15 mg total) by mouth 3 (three) times daily.    The patient is advised to call or  return to clinic if she does not see an improvement in symptoms, or to seek the care of the closest emergency department if she worsens with the above plan.   Philis Fendt, MHS, PA-C [facility] 12/09/2017 12:21 PM

## 2017-12-08 NOTE — Patient Instructions (Addendum)
  Start the new medication daily at night.  I have increased the dose of the buspar.  Continue taking the hydroxazine.  I have ordered a CAT scan of your brain.    IF you received an x-ray today, you will receive an invoice from [facility]. Please contact [facility] at [phone] with questions or concerns regarding your invoice.   IF you received labwork today, you will receive an invoice from [facility]. Please contact [facility] at [phone] with questions or concerns regarding your invoice.   Our billing staff will not be able to assist you with questions regarding bills from these companies.  You will be contacted with the lab results as soon as they are available. The fastest way to get your results is to activate your My Chart account. Instructions are located on the last page of this paperwork. If you have not heard from [place] regarding the results in 2 weeks, please contact this office.

## 2017-12-08 NOTE — Telephone Encounter (Signed)
[place] called and spoke to [place], [facility] who says the patient picked up both prescriptions on 11/24/17 and 11/25/17, so she has no more refills. But, she should be ok with refills until 12/25/17, if she's taking correctly. Patient called, left VM to return call back to the office to discuss how often she is taking Buspar. I advised that she should have enough pill left until her next refill due on 12/25/17.  Last OV:12/08/17 Last refill:11/24/17 30 tab/1 refill PTY:YPEJY Pharmacy: [facility] [address], [place] - [address] [phone] (Phone) [phone] (Fax)

## 2017-12-08 NOTE — Telephone Encounter (Signed)
Patient returned call about the Buspar 10 mg prescription, the patient was seen in the office today and the dosage increased, new prescription sent.

## 2017-12-12 ENCOUNTER — Ambulatory Visit (HOSPITAL_COMMUNITY)
Admission: RE | Admit: 2017-12-12 | Discharge: 2017-12-12 | Disposition: A | Discharge status: Home / Self Care | Payer: Self-pay | Source: Ambulatory Visit | Attending: Cardiology | Admitting: Cardiology

## 2017-12-12 DIAGNOSIS — R9439 Abnormal result of other cardiovascular function study: Secondary | ICD-10-CM | POA: Insufficient documentation

## 2017-12-12 MED ORDER — METOPROLOL TARTRATE 50 MG PO TABS
ORAL_TABLET | ORAL | Status: AC
  Administered 2017-12-12: 100 mg via ORAL
  Filled 2017-12-12: qty 2

## 2017-12-12 MED ORDER — METOPROLOL TARTRATE 100 MG PO TABS
100.0000 mg | ORAL_TABLET | Freq: Once | ORAL | Status: AC
  Administered 2017-12-12: 100 mg via ORAL
  Filled 2017-12-12: qty 1

## 2017-12-12 MED ORDER — NITROGLYCERIN 0.4 MG SL SUBL
SUBLINGUAL_TABLET | SUBLINGUAL | Status: AC
  Administered 2017-12-12: 0.8 mg via SUBLINGUAL
  Filled 2017-12-12: qty 2

## 2017-12-12 MED ORDER — NITROGLYCERIN 0.4 MG SL SUBL
0.8000 mg | SUBLINGUAL_TABLET | SUBLINGUAL | Status: DC | PRN
  Administered 2017-12-12: 0.8 mg via SUBLINGUAL
  Filled 2017-12-12: qty 25

## 2017-12-12 MED ORDER — IOPAMIDOL (ISOVUE-370) INJECTION 76%
INTRAVENOUS | Status: AC
  Administered 2017-12-12: 80 mL
  Filled 2017-12-12: qty 100

## 2017-12-13 ENCOUNTER — Encounter (HOSPITAL_COMMUNITY): Payer: Self-pay | Admitting: Emergency Medicine

## 2017-12-13 ENCOUNTER — Emergency Department (HOSPITAL_COMMUNITY)
Admission: EM | Admit: 2017-12-13 | Discharge: 2017-12-13 | Disposition: A | Discharge status: Home / Self Care | Payer: Self-pay | Attending: Emergency Medicine | Admitting: Emergency Medicine

## 2017-12-13 ENCOUNTER — Emergency Department (HOSPITAL_COMMUNITY): Payer: Self-pay

## 2017-12-13 DIAGNOSIS — R42 Dizziness and giddiness: Secondary | ICD-10-CM | POA: Insufficient documentation

## 2017-12-13 DIAGNOSIS — Z79899 Other long term (current) drug therapy: Secondary | ICD-10-CM | POA: Insufficient documentation

## 2017-12-13 DIAGNOSIS — R002 Palpitations: Secondary | ICD-10-CM | POA: Insufficient documentation

## 2017-12-13 DIAGNOSIS — Z87891 Personal history of nicotine dependence: Secondary | ICD-10-CM | POA: Insufficient documentation

## 2017-12-13 LAB — CBC
HCT: 46.5 % — ABNORMAL HIGH (ref 36.0–46.0)
Hemoglobin: 16.3 g/dL — ABNORMAL HIGH (ref 12.0–15.0)
MCH: 31 pg (ref 26.0–34.0)
MCHC: 35.1 g/dL (ref 30.0–36.0)
MCV: 88.6 fL (ref 78.0–100.0)
Platelets: 302 10*3/uL (ref 150–400)
RBC: 5.25 MIL/uL — ABNORMAL HIGH (ref 3.87–5.11)
RDW: 11.9 % (ref 11.5–15.5)
WBC: 5.9 10*3/uL (ref 4.0–10.5)

## 2017-12-13 LAB — BASIC METABOLIC PANEL
Anion gap: 13 (ref 5–15)
BUN: 14 mg/dL (ref 6–20)
CO2: 28 mmol/L (ref 22–32)
Calcium: 9.8 mg/dL (ref 8.9–10.3)
Chloride: 97 mmol/L — ABNORMAL LOW (ref 98–111)
Creatinine, Ser: 0.96 mg/dL (ref 0.44–1.00)
GFR calc Af Amer: >60 mL/min (ref >60)
GFR calc non Af Amer: >60 mL/min (ref >60)
Glucose, Bld: 104 mg/dL — ABNORMAL HIGH (ref 70–99)
Potassium: 2.7 mmol/L — CL (ref 3.5–5.1)
Sodium: 138 mmol/L (ref 135–145)

## 2017-12-13 LAB — MAGNESIUM: Magnesium: 2.4 mg/dL (ref 1.7–2.4)

## 2017-12-13 LAB — I-STAT TROPONIN, ED: Troponin i, poc: 0 ng/mL (ref 0.00–0.08)

## 2017-12-13 LAB — I-STAT BETA HCG BLOOD, ED (MC, WL, AP ONLY): I-stat hCG, quantitative: <5 m[IU]/mL (ref <5)

## 2017-12-13 MED ORDER — MECLIZINE HCL 25 MG PO TABS
25.0000 mg | ORAL_TABLET | Freq: Once | ORAL | Status: AC
  Administered 2017-12-13: 25 mg via ORAL
  Filled 2017-12-13: qty 1

## 2017-12-13 MED ORDER — FLUTICASONE PROPIONATE 50 MCG/ACT NA SUSP: 1.0000 | Freq: Every day | NASAL | 2 refills | Status: DC

## 2017-12-13 MED ORDER — MECLIZINE HCL 25 MG PO TABS: 25.0000 mg | ORAL_TABLET | Freq: Three times a day (TID) | ORAL | 0 refills | Status: DC | PRN

## 2017-12-13 MED ORDER — POTASSIUM CHLORIDE 10 MEQ/100ML IV SOLN
10.0000 meq | INTRAVENOUS | Status: AC
  Administered 2017-12-13 (×2): 10 meq via INTRAVENOUS
  Filled 2017-12-13 (×2): qty 100

## 2017-12-13 MED ORDER — POTASSIUM CHLORIDE CRYS ER 20 MEQ PO TBCR: 40.0000 meq | EXTENDED_RELEASE_TABLET | Freq: Two times a day (BID) | ORAL | 0 refills | Status: DC

## 2017-12-13 MED ORDER — MELATONIN 5 MG PO CAPS: 1.0000 | ORAL_CAPSULE | Freq: Every day | ORAL | 0 refills | Status: DC

## 2017-12-13 MED ORDER — POTASSIUM CHLORIDE CRYS ER 20 MEQ PO TBCR
40.0000 meq | EXTENDED_RELEASE_TABLET | Freq: Once | ORAL | Status: AC
  Administered 2017-12-13: 40 meq via ORAL
  Filled 2017-12-13: qty 2

## 2017-12-13 NOTE — ED Notes (Addendum)
Pt transported to CT ?

## 2017-12-13 NOTE — Discharge Instructions (Addendum)
Please see the information and instructions below regarding your visit.  Your diagnoses today include:  1. Palpitations   2. Dizziness     Tests performed today include: See side panel of your discharge paperwork for testing performed today. Vital signs are listed at the bottom of these instructions.   Your CT scan showed no masses or bleeding in the brain.  Medications prescribed:    Take any prescribed medications only as prescribed, and any over the counter medications only as directed on the packaging.  You are prescribed potassium pills, 40 mg twice a day.  You may take meclizine medication for dizziness up to 3 times daily.  He can make you sleepy, so do not drive, drink alcohol, or operate machinery while taking it.  Please move your hydroxyzine to nighttime.  You may take melatonin, 5 mg at night.  Home care instructions:  Please follow any educational materials contained in this packet.   Follow-up instructions: Please follow-up with your primary care provider in one week for further evaluation of your symptoms if they are not completely improved.   Please follow up with ENT regarding dizziness.  Please follow-up with Dr. Orson Slick warding regarding your blood pressure medication.  It could be the cause of your low potassium today.  Return instructions:  Please return to the Emergency Department if you experience worsening symptoms.  Please return to the emergency department for worsening dizziness, visual changes, chest pain, shortness of breath, or worsening palpitations. Please return if you have any other emergent concerns.  Additional Information:   Your vital signs today were: BP 109/71    Pulse (!) 59    Temp (!) 97.5 F (36.4 C) (Oral)    Resp 16    SpO2 100%  If your blood pressure (BP) was elevated on multiple readings during this visit above 130 for the top number or above 80 for the bottom number, please have this repeated by your primary care provider  within one month. --------------  Thank you for allowing [place] to participate in your care today.

## 2017-12-13 NOTE — ED Triage Notes (Signed)
Pt reports cp, dizziness and feeling like her heart is racing, states that symptoms have been ongoing, reports she was diagnosed with anxiety at urgent care. Pt was given buspar and lexapro but states they are not working. Pt has cardiologist that is running some tests. Pt a/ox, resp e/u, nad. Calm and cooperative.

## 2017-12-13 NOTE — ED Notes (Signed)
ED Provider at bedside. 

## 2017-12-13 NOTE — ED Provider Notes (Signed)
[facility] EMERGENCY DEPARTMENT Provider Note   CSN: [id number] Arrival date & time: 12/13/17  0502     History   Chief Complaint Chief Complaint  Patient presents with  . Chest Pain    HPI Victoria Snyder is a 52 y.o. female.  HPI  Patient is a 52 year old female with a history of anxiety, and chest pain, currently being worked up by cardiology presenting for dizziness, palpitations, and chest discomfort.  Patient reports that the dizziness and chest discomfort are chronic, and are not particularly worse today, but she felt that the palpitations were worse.  Patient reports that she will wake up in the morning with a sensation of both lightheadedness, and that she is spinning, and it does not particularly improve throughout the day.  Patient reports that when she experiences a day, she also had a sensation of fluttering in her heart that was more pronounced than prior.  Patient reports that she had a "dye study" on her heart yesterday per cardiology, and was also started on a new medication for blood pressure, of which she is unaware of the name.  Patient denies headache, but reports that she has felt some left ear "popping".  Patient denies any weakness or numbness anyone of part of her body but does endorse generalized weakness.  No syncope or presyncope.  No nausea or vomiting.  Patient denies any active chest pain, ischemic heart disease, history DVT/PE, estrogen use, lower extremity edema or tenderness, recent immobilization, has position, or surgery.  Past Medical History:  Diagnosis Date  . Blood transfusion without reported diagnosis     There are no active problems to display for this patient.   Past Surgical History:  Procedure Laterality Date  . ABDOMINAL HYSTERECTOMY       OB History   None      Home Medications    Prior to Admission medications   Medication Sig Start Date End Date Taking? Authorizing Provider  Ascorbic Acid (VITAMIN C)  1000 MG tablet Take 1,000 mg by mouth daily.   Yes [provider]  busPIRone (BUSPAR) 15 MG tablet Take 1 tablet (15 mg total) by mouth 3 (three) times daily. 12/08/17  Yes Tereasa Coop, PA-C  escitalopram (LEXAPRO) 5 MG tablet Take 1 tablet (5 mg total) by mouth daily. 12/08/17  Yes Tereasa Coop, PA-C  hydrOXYzine (ATARAX/VISTARIL) 10 MG tablet Take 1-3 tablets (10-30 mg total) by mouth 3 (three) times daily as needed. Patient taking differently: Take 10-30 mg by mouth 3 (three) times daily as needed for anxiety.  12/08/17  Yes Tereasa Coop, PA-C  Multiple Vitamins-Minerals (MULTIVITAMIN WITH MINERALS) tablet Take 1 tablet by mouth daily.   Yes [provider]    Family History Family History  Problem Relation Age of Onset  . Heart disease Mother        heart attack  . Stroke Father     Social History Social History   Tobacco Use  . Smoking status: Former Research scientist (life sciences)  . Smokeless tobacco: Never Used  Substance Use Topics  . Alcohol use: Yes    Alcohol/week: 0.6 - 1.2 oz    Types: 1 - 2 Standard drinks or equivalent per week  . Drug use: No     Allergies   Patient has no known allergies.   Review of Systems Review of Systems  Constitutional: Negative for chills and fever.  HENT: Positive for congestion and rhinorrhea.   Eyes: Negative for visual disturbance.  Respiratory: Positive for chest tightness. Negative for shortness of breath and wheezing.        +Intermittent chest tightness.  Cardiovascular: Positive for palpitations. Negative for leg swelling.  Gastrointestinal: Negative for abdominal pain, nausea and vomiting.  Genitourinary: Negative for difficulty urinating and urgency.  Musculoskeletal: Negative for arthralgias and myalgias.  Skin: Negative for rash.  Neurological: Positive for dizziness and light-headedness. Negative for syncope, weakness, numbness and headaches.     Physical Exam Updated Vital Signs BP (!) 133/93   Pulse 73    Temp (!) 97.5 F (36.4 C) (Oral)   Resp 14   SpO2 100%   Physical Exam  Constitutional: She appears well-developed and well-nourished. No distress.  HENT:  Head: Normocephalic and atraumatic.  Mouth/Throat: Oropharynx is clear and moist.  Small serous effusions of ears bilaterally.  Eyes: Pupils are equal, round, and reactive to light. Conjunctivae and EOM are normal.  Neck: Normal range of motion. Neck supple.  Cardiovascular: Normal rate, regular rhythm, S1 normal and S2 normal.  No murmur heard. Pulses:      Radial pulses are 2+ on the right side, and 2+ on the left side.       Dorsalis pedis pulses are 2+ on the right side, and 2+ on the left side.  Pulmonary/Chest: Effort normal and breath sounds normal. She has no wheezes. She has no rales.  Abdominal: Soft. She exhibits no distension. There is no tenderness. There is no guarding.  Musculoskeletal: Normal range of motion. She exhibits no edema or deformity.  Lymphadenopathy:    She has no cervical adenopathy.  Neurological: She is alert.  Mental Status:  Alert, oriented, thought content appropriate, able to give a coherent history. Speech fluent without evidence of aphasia. Able to follow 2 step commands without difficulty.  Cranial Nerves:  II:  Peripheral visual fields grossly normal, pupils equal, round, reactive to light III,IV, VI: ptosis not present, extra-ocular motions intact bilaterally  V,VII: smile symmetric, facial light touch sensation equal VIII: hearing grossly normal to voice  X: uvula elevates symmetrically  XI: bilateral shoulder shrug symmetric and strong XII: midline tongue extension without fassiculations Motor:  Normal tone. 5/5 in upper and lower extremities bilaterally including strong and equal grip strength and dorsiflexion/plantar flexion Normal and symmetric gait. HINTS Exam: Patient exhibits horizontal nystagmus, fast component beating to the left. Test of skew without saccade on isolation  of each eye. Head impulse testing without saccade, however patient is not actively experiencing vertigo at the time of exam.  Skin: Skin is warm and dry. No rash noted. No erythema.  Psychiatric: She has a normal mood and affect. Her behavior is normal. Judgment and thought content normal.  Nursing note and vitals reviewed.    ED Treatments / Results  Labs (all labs ordered are listed, but only abnormal results are displayed) Labs Reviewed  BASIC METABOLIC PANEL - Abnormal; Notable for the following components:      Result Value   Potassium 2.7 (*)    Chloride 97 (*)    Glucose, Bld 104 (*)    All other components within normal limits  CBC - Abnormal; Notable for the following components:   RBC 5.25 (*)    Hemoglobin 16.3 (*)    HCT 46.5 (*)    All other components within normal limits  MAGNESIUM  I-STAT TROPONIN, ED  I-STAT BETA HCG BLOOD, ED ([facility], [facility], [facility] ONLY)    EKG EKG Interpretation  Date/Time:  Saturday December 13 2017 05:12:45  EDT Ventricular Rate:  74 PR Interval:  134 QRS Duration: 98 QT Interval:  388 QTC Calculation: 430 R Axis:   74 Text Interpretation:  Normal sinus rhythm Right atrial enlargement T wave abnormality, consider inferior ischemia Abnormal ECG No STEMI.  Confirmed by Nanda Quinton [phone]) on 12/13/2017 6:27:19 PM   Radiology Dg Chest 2 View  Result Date: 12/13/2017 CLINICAL DATA:  52 year old female with chest pain. EXAM: CHEST - 2 VIEW COMPARISON:  Chest CT dated 12/12/2017 FINDINGS: The heart size and mediastinal contours are within normal limits. Both lungs are clear. The visualized skeletal structures are unremarkable. IMPRESSION: No active cardiopulmonary disease. Electronically Signed   By: Anner Crete M.D.   On: 12/13/2017 06:59   Ct Coronary Morph W/cta Cor W/score W/ca W/cm &/or Wo/cm  Result Date: 12/12/2017 CLINICAL DATA:  52 year old female with history of chronic shortness of breath. EXAM: CT ANGIOGRAPHY OF THE HEART, CORONARY  ARTERY, STRUCTURE, AND MORPHOLOGY PROCEDURE: CT angiography of the coronary vessels was performed on a 192 channel system using prospective ECG gating. A scout and ECG-gated noncontrast exam (for calcium scoring) were performed. Appropriate delay was determined by bolus tracking after injection of iodinated contrast, and an ECG-gated coronary CTA was performed with sub-mm slice collimation during late diastole. Imaging post processing was performed on an independent workstation creating multiplanar and 3-D images, allowing for quantitative analysis of the heart and coronary arteries. Note that this exam targets the heart and the chest was not imaged in its entirety. COMPARISON:  None. MEDICATIONS: Lopressor:  200 mg, P.O. Nitroglycerin 400 mcg, sublingual FINDINGS: Technical quality:  Good. Heart rate:  70-75 at time of image acquisition CORONARY ARTERIES: Left Main: No evidence of significant coronary artery atherosclerosis (CAD-RADS 0). LAD: No evidence of significant coronary artery atherosclerosis (CAD-RADS 0). Diagonals: No evidence of significant coronary artery atherosclerosis (CAD-RADS 0). LCx: No evidence of significant coronary artery atherosclerosis (CAD-RADS 0). OMs: No evidence of significant coronary artery atherosclerosis (CAD-RADS 0). RCA: No evidence of significant coronary artery atherosclerosis (CAD-RADS 0). PDA: No evidence of significant coronary artery atherosclerosis (CAD-RADS 0). Dominance:  Codominant. CORONARY CALCIUM: Total Agatston Score:  0 MESA database percentile:  N/A AORTA AND PULMONARY MEASUREMENTS: Aortic root (21 - 40 mm): 22 mm at the annulus 28 mm at the sinuses of Valsalva 25  mm at the sinotubular junction Ascending aorta: (< 40 mm):  29 mm Descending aorta:  (< 40 mm):  27 mm Main pulmonary artery: (< 30 mm):  21 mm OTHER FINDINGS: Within the visualized portions of the thorax there are no suspicious appearing pulmonary nodules or masses, there is no acute consolidative  airspace disease, no pleural effusions, no pneumothorax and no lymphadenopathy. Visualized portions of the upper abdomen are unremarkable. There are no aggressive appearing lytic or blastic lesions noted in the visualized portions of the skeleton. IMPRESSION: 1. No evidence of significant coronary artery atherosclerosis. Patient's total coronary artery calcium score is 0 which indicates a very low (but non-zero) risk of major adverse cardiovascular events over the next 10 years. 2. No significant incidental noncardiac findings are noted. Electronically Signed   By: Vinnie Langton M.D.   On: 12/12/2017 12:17    Procedures Procedures (including critical care time)  Medications Ordered in ED Medications  potassium chloride 10 mEq in 100 mL IVPB (10 mEq Intravenous New Bag/Given 12/13/17 0742)  meclizine (ANTIVERT) tablet 25 mg (has no administration in time range)  potassium chloride SA (K-DUR,KLOR-CON) CR tablet 40 mEq (40 mEq Oral Given  12/13/17 0741)     Initial Impression / Assessment and Plan / ED Course  I have reviewed the triage vital signs and the nursing notes.  Pertinent labs & imaging results that were available during my care of the patient were reviewed by me and considered in my medical decision making (see chart for details).  Clinical Course as of Dec 14 1619  Sat Dec 13, 2017  0911 Reassessed. Getting K. Feels better after meclizine. Low suspicion for central vertigo. Has left-beating horizontal nystagmus.   [AM]    Clinical Course User Index [AM] Albesa Seen, PA-C    Patient nontoxic-appearing, afebrile, no acute distress.  Left-sided chest pain is intermittent, and patient reports it is no different today.  I doubt ACS given the lack of exertional symptoms, patient's current work-up for CAD, and lack of symptoms on my evaluation of the patient.  Patient was concerned about palpitations today, which appeared to be worse than typical.  Patient sensation of dizziness,  which she describes as intermittent vertigo, is of no different severity today.  Work-up, patient found to have potassium of 2.7.  This was aggressively repleted emergency department, patient was prescribed outpatient oral repletion.  Magnesium was normal.  I suspect the etiology of this is the initiation of chlorthalidone, as patient has not had any other medications, denies nausea or vomiting, diarrhea, or nutritional deficiency.  No history of renal disease, no renal dysfunction today.  Patient does have a history of low potassium, but not this low.  Patient instructed to stop chlorthalidone, and discuss antihypertensive management with her cardiologist.  No hypertension emergency department today.  Instructed patient to follow-up with primary care provider within the week for recheck.  EKG with T wave flattening, consistent with patient's hypokalemia.  Patient to follow-up with her cardiologist, primary care provider, and ENT provided emergency department.  Patient can return precautions for any worsening dizziness, chest pain or shortness breath.  Patient is in understanding and agrees with the plan of care.  This is a supervised visit with Dr. Merrily Pew. Evaluation, management, and discharge planning discussed with this attending physician.  Final Clinical Impressions(s) / ED Diagnoses   Final diagnoses:  Palpitations  Dizziness    ED Discharge Orders        Ordered    meclizine (ANTIVERT) 25 MG tablet  3 times daily PRN     12/13/17 1225    potassium chloride SA (K-DUR,KLOR-CON) 20 MEQ tablet  2 times daily     12/13/17 1225    fluticasone (FLONASE) 50 MCG/ACT nasal spray  Daily     12/13/17 1226    Melatonin 5 MG CAPS  Daily at bedtime     12/13/17 1230       Tamala Julian 12/13/17 1827    Mesner, Corene Cornea, MD 12/16/17 [phone]

## 2017-12-17 ENCOUNTER — Ambulatory Visit (HOSPITAL_COMMUNITY): Payer: Self-pay

## 2017-12-24 ENCOUNTER — Inpatient Hospital Stay: Payer: Self-pay | Admitting: Physician Assistant

## 2017-12-25 ENCOUNTER — Inpatient Hospital Stay: Payer: Self-pay | Admitting: Physician Assistant

## 2018-01-02 ENCOUNTER — Telehealth: Payer: Self-pay

## 2018-01-02 NOTE — Telephone Encounter (Signed)
Called pt and got disconnected. Left message to call our office regarding her PV and colonoscopy appointment.  Pt needs cardiac clearance prior to her colonoscopy. Colon and PV needs to be cancelled.  Will call again on Monday.Monish Haliburton in [facility].

## 2018-01-05 NOTE — Telephone Encounter (Signed)
Patient was called to discuss recent visit to the ER for chest pain. Numerous attempts were made to reach the patient. Will see patient if she comes to PV. Patient should be rescheduled after she completes her cardiac work up.   Riki Sheer, LPN

## 2018-01-07 ENCOUNTER — Telehealth: Payer: Self-pay | Admitting: *Deleted

## 2018-01-07 NOTE — Telephone Encounter (Signed)
Called pt to discuss cardiac clearance needed before colonoscopy. No answer, unable to leave a message due to "mail box full". Pt is for Pre-visit on 01/08/18 we will discuss it then.

## 2018-01-08 ENCOUNTER — Telehealth: Payer: Self-pay | Admitting: *Deleted

## 2018-01-08 ENCOUNTER — Encounter: Payer: Self-pay | Admitting: *Deleted

## 2018-01-08 NOTE — Telephone Encounter (Signed)
Patient no show for previsit today. Attempted to call patient to inform of need for cardiac clearance prior to procedure. No answer and no ability to leave message as mailbox is full. Attempted to call work number, number changed to [phone] with message that states the company has closed. No show letter sent advising patient she needs to call our office for important information regarding before rescheduling previsit or any procedures.

## 2018-01-16 ENCOUNTER — Encounter: Payer: Self-pay | Admitting: Physician Assistant

## 2018-01-16 ENCOUNTER — Ambulatory Visit (INDEPENDENT_AMBULATORY_CARE_PROVIDER_SITE_OTHER): Payer: Self-pay | Admitting: Physician Assistant

## 2018-01-16 VITALS — BP 115/79 | HR 92 | Temp 98.7°F | Resp 18 | Ht 69.0 in | Wt 166.6 lb

## 2018-01-16 DIAGNOSIS — R930 Abnormal findings on diagnostic imaging of skull and head, not elsewhere classified: Secondary | ICD-10-CM

## 2018-01-16 DIAGNOSIS — F99 Mental disorder, not otherwise specified: Secondary | ICD-10-CM

## 2018-01-16 DIAGNOSIS — R42 Dizziness and giddiness: Secondary | ICD-10-CM

## 2018-01-16 DIAGNOSIS — F321 Major depressive disorder, single episode, moderate: Secondary | ICD-10-CM

## 2018-01-16 DIAGNOSIS — F5105 Insomnia due to other mental disorder: Secondary | ICD-10-CM

## 2018-01-16 MED ORDER — AMOXICILLIN-POT CLAVULANATE 875-125 MG PO TABS: 1.0000 | ORAL_TABLET | Freq: Two times a day (BID) | ORAL | 0 refills | Status: DC

## 2018-01-16 NOTE — Progress Notes (Signed)
01/16/2018 11:22 AM   DOB: Aug 03, 1965 / MRN: [id number]  SUBJECTIVE:   Victoria Snyder is a 52 y.o. female presenting for FMLA paperwork completion.  Has been out of work since  10/22/17 for depression and dizziness. I had ordered cards eval and this was good.  She came back with continued symptoms and psch weighed in and diagnosed depression. She has since lost her job.  Recent CT head show sinus opacification.    Depression screen PHQ 2/9 12/08/2017  Decreased Interest 1  Down, Depressed, Hopeless 3  PHQ - 2 Score 4  Altered sleeping 2  Tired, decreased energy 2  Change in appetite 3  Feeling bad or failure about yourself  2  Trouble concentrating 2  Moving slowly or fidgety/restless 2  Suicidal thoughts 0  PHQ-9 Score 17     She has No Known Allergies.   She  has a past medical history of Blood transfusion without reported diagnosis.    She  reports that she has quit smoking. She has never used smokeless tobacco. She reports that she drinks about 1.0 - 2.0 standard drinks of alcohol per week. She reports that she does not use drugs. She  reports that she currently engages in sexual activity. The patient  has a past surgical history that includes Abdominal hysterectomy.  Her family history includes Heart disease in her mother; Stroke in her father.  Review of Systems  Constitutional: Negative for chills, diaphoresis and fever.  Eyes: Negative.   Respiratory: Negative for cough, hemoptysis, sputum production, shortness of breath and wheezing.   Cardiovascular: Negative for chest pain, orthopnea and leg swelling.  Gastrointestinal: Negative for abdominal pain, blood in stool, constipation, diarrhea, heartburn, melena, nausea and vomiting.  Genitourinary: Negative for flank pain.  Skin: Negative for rash.  Neurological: Positive for dizziness. Negative for sensory change, speech change, focal weakness and headaches.  Psychiatric/Behavioral: Positive for depression. Negative for  hallucinations, memory loss, substance abuse and suicidal ideas. The patient is nervous/anxious and has insomnia.     The problem list and medications were reviewed and updated by myself where necessary and exist elsewhere in the encounter.   OBJECTIVE:  BP 115/79   Pulse 92   Temp 98.7 F (37.1 C) (Oral)   Resp 18   Ht 5\' 9"  (1.753 m)   Wt 166 lb 9.6 oz (75.6 kg)   SpO2 96%   BMI 24.60 kg/m   Wt Readings from Last 3 Encounters:  01/16/18 166 lb 9.6 oz (75.6 kg)  12/08/17 161 lb 3.2 oz (73.1 kg)  10/29/17 165 lb (74.8 kg)   Temp Readings from Last 3 Encounters:  01/16/18 98.7 F (37.1 C) (Oral)  12/13/17 (!) 97.5 F (36.4 C) (Oral)  12/08/17 99.3 F (37.4 C) (Oral)   BP Readings from Last 3 Encounters:  01/16/18 115/79  12/13/17 109/71  12/12/17 108/71   Pulse Readings from Last 3 Encounters:  01/16/18 92  12/13/17 (!) 59  12/12/17 73    Physical Exam  Constitutional: She is oriented to person, place, and time. She appears well-nourished.  Non-toxic appearance. No distress.  Eyes: Pupils are equal, round, and reactive to light. EOM are normal.  Cardiovascular: Normal rate, regular rhythm, S1 normal, S2 normal, normal heart sounds and intact distal pulses. Exam reveals no gallop, no friction rub and no decreased pulses.  No murmur heard. Pulmonary/Chest: Effort normal. No stridor. No respiratory distress. She has no wheezes. She has no rales.  Abdominal: She exhibits  no distension.  Musculoskeletal: She exhibits no edema.  Neurological: She is alert and oriented to person, place, and time. No cranial nerve deficit. Gait normal.  Skin: Skin is warm and dry. She is not diaphoretic. No pallor.  Psychiatric: Her speech is normal and behavior is normal. Judgment and thought content normal. Her mood appears anxious. Her affect is not angry, not blunt, not labile and not inappropriate. She is not actively hallucinating. Cognition and memory are normal. She exhibits a  depressed mood. She is attentive.  Vitals reviewed.   Lab Results  Component Value Date   HGBA1C 5.2 10/22/2017    Lab Results  Component Value Date   WBC 5.9 12/13/2017   HGB 16.3 (H) 12/13/2017   HCT 46.5 (H) 12/13/2017   MCV 88.6 12/13/2017   PLT 302 12/13/2017    Lab Results  Component Value Date   CREATININE 0.96 12/13/2017   BUN 14 12/13/2017   NA 138 12/13/2017   K 2.7 (LL) 12/13/2017   CL 97 (L) 12/13/2017   CO2 28 12/13/2017    Lab Results  Component Value Date   ALT 20 10/22/2017   AST 20 10/22/2017   ALKPHOS 87 10/22/2017   BILITOT 0.6 10/22/2017    Lab Results  Component Value Date   TSH 1.840 10/22/2017    Lab Results  Component Value Date   CHOL 203 (H) 10/22/2017   HDL 79 10/22/2017   LDLCALC 99 10/22/2017   TRIG 123 10/22/2017   CHOLHDL 2.6 10/22/2017     ASSESSMENT AND PLAN:  Victoria Snyder was seen today for fmla.  Diagnoses and all orders for this visit:  Current moderate episode of major depressive disorder without prior episode [facility]) Comments: She can not work like this.  Completing short term disability to this affect. Out of work starting 10/22/17.  We will complete based of this diagnosis and fax to her company.   Dizziness: 2/2 the first or final problem.  Seeing psychiatry at this time.   Insomnia due to other mental disorder: Likely 2/2 to the first problem.   Opacification of ethmoid sinus -     amoxicillin-clavulanate (AUGMENTIN) 875-125 MG tablet; Take 1 tablet by mouth 2 (two) times daily.    The patient is advised to call or return to clinic if she does not see an improvement in symptoms, or to seek the care of the closest emergency department if she worsens with the above plan.   Philis Fendt, MHS, PA-C [facility] 01/16/2018 11:22 AM

## 2018-01-16 NOTE — Patient Instructions (Signed)
° ° ° °  If you have lab work done today you will be contacted with your lab results within the next 2 weeks.  If you have not heard from us then please contact us. The fastest way to get your results is to register for My Chart. ° ° °IF you received an x-ray today, you will receive an invoice from [facility]. Please contact [facility] at [phone] with questions or concerns regarding your invoice.  ° °IF you received labwork today, you will receive an invoice from [facility]. Please contact [facility] at [phone] with questions or concerns regarding your invoice.  ° °Our billing staff will not be able to assist you with questions regarding bills from these companies. ° °You will be contacted with the lab results as soon as they are available. The fastest way to get your results is to activate your My Chart account. Instructions are located on the last page of this paperwork. If you have not heard from us regarding the results in 2 weeks, please contact this office. °  ° ° ° °

## 2018-01-19 ENCOUNTER — Encounter: Payer: Self-pay | Admitting: Gastroenterology

## 2018-02-10 ENCOUNTER — Ambulatory Visit: Payer: Medicaid Other | Admitting: Family Medicine

## 2018-02-27 ENCOUNTER — Encounter: Payer: Self-pay | Admitting: Family Medicine

## 2018-02-27 ENCOUNTER — Ambulatory Visit: Payer: Self-pay | Attending: Family Medicine | Admitting: Family Medicine

## 2018-02-27 VITALS — BP 152/90 | HR 84 | Temp 98.8°F | Resp 18 | Ht 69.0 in | Wt 163.0 lb

## 2018-02-27 DIAGNOSIS — F329 Major depressive disorder, single episode, unspecified: Secondary | ICD-10-CM | POA: Insufficient documentation

## 2018-02-27 DIAGNOSIS — F419 Anxiety disorder, unspecified: Secondary | ICD-10-CM | POA: Insufficient documentation

## 2018-02-27 DIAGNOSIS — I10 Essential (primary) hypertension: Secondary | ICD-10-CM

## 2018-02-27 DIAGNOSIS — R42 Dizziness and giddiness: Secondary | ICD-10-CM

## 2018-02-27 DIAGNOSIS — M545 Low back pain, unspecified: Secondary | ICD-10-CM

## 2018-02-27 DIAGNOSIS — M5440 Lumbago with sciatica, unspecified side: Secondary | ICD-10-CM

## 2018-02-27 DIAGNOSIS — E876 Hypokalemia: Secondary | ICD-10-CM

## 2018-02-27 DIAGNOSIS — Z2821 Immunization not carried out because of patient refusal: Secondary | ICD-10-CM | POA: Insufficient documentation

## 2018-02-27 DIAGNOSIS — Z87891 Personal history of nicotine dependence: Secondary | ICD-10-CM | POA: Insufficient documentation

## 2018-02-27 DIAGNOSIS — Z8249 Family history of ischemic heart disease and other diseases of the circulatory system: Secondary | ICD-10-CM | POA: Insufficient documentation

## 2018-02-27 MED ORDER — MECLIZINE HCL 25 MG PO TABS: 25.0000 mg | ORAL_TABLET | Freq: Three times a day (TID) | ORAL | 3 refills | Status: DC | PRN

## 2018-02-27 MED ORDER — AMLODIPINE BESYLATE 5 MG PO TABS: 5.0000 mg | ORAL_TABLET | Freq: Every day | ORAL | 3 refills | Status: DC

## 2018-02-27 MED ORDER — GABAPENTIN 100 MG PO CAPS: ORAL_CAPSULE | ORAL | 3 refills | Status: DC

## 2018-02-27 NOTE — Progress Notes (Signed)
Subjective:    Patient ID: Victoria Snyder, female    DOB: [birth year], 52 y.o.   MRN: [id number]  HPI 52 year old female who is here to establish as a new patient.  Patient reports that she has not felt well off and on since May of this year.  Patient states that she initially presented to urgent care due to sensation of her heart fluttering.  Patient states that she was diagnosed with dizziness and anxiety.  Patient states that she was also told to see a heart doctor.  Patient states that the heart doctor told her that her work-up was fine.  Patient later went back to urgent care/ED secondary to sensation of heart fluttering and not feeling well and patient was diagnosed as having a very low potassium.  Patient also states that she has had a recent sinus infection and was on antibiotics which she has now completed.  Patient states that she continues to have a cough and had nasal congestion but started taking Claritin-D which is now allowing her to breathe through her nose better and she is now having some clear nasal discharge.  Patient also with some postnasal drainage.  Patient also reports a history of low back pain and had an MRI which showed disc problems and patient has radiation of pain to her right foot.      Patient reports that she is having some issues with anxiety as well and patient states that she was assessed earlier today at [facility].  Patient states that she was told that she has anxiety, major depression and that she may have bipolar disorder.  Patient is currently on medication.  Patient denies any suicidal thoughts or ideations.      Patient reports that she has a family history significant for mother having had heart disease and a heart attack as well as hypertension.  Patient states that her father has high blood pressure and has had a stroke.  Patient states that she has a brother with mental illness.  Patient reports no known drug allergies.  Patient states that she is married and a  former smoker but that she quit smoking about 25 years ago.  Patient is currently employed and works at [employer].  Patient reports a surgical history of removal of fibroids and removal of a mass in her left lower abdomen.   Review of Systems  Constitutional: Positive for fatigue. Negative for chills and fever.  HENT: Positive for congestion, postnasal drip and rhinorrhea. Negative for sore throat and trouble swallowing.   Respiratory: Positive for cough. Negative for shortness of breath.   Cardiovascular: Positive for palpitations (Has had sensation of fluttering in her chest previously). Negative for chest pain and leg swelling.  Gastrointestinal: Negative for abdominal pain and blood in stool.  Endocrine: Negative for polydipsia, polyphagia and polyuria.  Genitourinary: Negative for dysuria and frequency.  Musculoskeletal: Positive for arthralgias, back pain and myalgias. Negative for gait problem and joint swelling.  Neurological: Positive for dizziness (Patient reports a history of vertigo). Negative for light-headedness and headaches.  Psychiatric/Behavioral: Negative for suicidal ideas. The patient is nervous/anxious.        Objective:   Physical Exam BP (!) 152/90 (BP Location: Left Arm, Patient Position: Sitting, Cuff Size: Normal)   Pulse 84   Temp 98.8 F (37.1 C) (Oral)   Resp 18   Ht 5\' 9"  (1.753 m)   Wt 163 lb (73.9 kg)   SpO2 100%   BMI 24.07 kg/m Vital signs and  nurse's note reviewed General-well-nourished, well-developed older female in no acute distress but patient appears slightly anxious ENT-TMs dull, patient with edematous nasal turbinates with clear to white nasal discharge, patient with mild posterior pharynx erythema Neck-supple, no lymphadenopathy, no thyromegaly, no carotid bruit Cardiovascular-regular rate and rhythm Lungs-clear to auscultation bilaterally Abdomen-soft, nontender Back-no CVA tenderness, patient with some lumbosacral discomfort to palpation,  positive right seated leg raise Extremities-no edema Psych- patient with some anxiety but normal mood and judgment        Assessment & Plan:  1. Essential hypertension Patient's blood pressure is elevated at today's visit.  Patient given prescription for amlodipine 5 mg.  Discussed DASH diet.  Patient is encouraged to avoid the use of decongestants which can increase her blood pressure. - amLODipine (NORVASC) 5 MG tablet; Take 1 tablet (5 mg total) by mouth daily. To lower blood pressure  Dispense: 30 tablet; Refill: 3  2. Dizziness Patient with complaint of dizziness and patient reports a history of vertigo.  Will check BMP to make sure the electrolytes are within normal.  Patient given prescription for meclizine which she has used in the past - Basic Metabolic Panel - meclizine (ANTIVERT) 25 MG tablet; Take 1 tablet (25 mg total) by mouth 3 (three) times daily as needed for dizziness.  Dispense: 30 tablet; Refill: 3  3. Low back pain with radiation Patient with complaint of low back pain with radicular symptoms.  Prescription provided for patient to try Neurontin, 1 to 2 pills at bedtime as needed for nerve pain.  At this time, will not prescribe daytime dose secondary to patient's issues with dizziness - gabapentin (NEURONTIN) 100 MG capsule; 1-2 pills at bedtime as needed for nerve pain  Dispense: 60 capsule; Refill: 3  4. Hypokalemia On review of chart, patient has had prior significant hypokalemia which was likely the cause of her symptoms of palpitations/heart fluttering.  Will recheck potassium level at today's visit  *Influenza immunization offered at today's visit but declined by the patient  An After Visit Summary was printed and given to the patient.  Return in about 4 weeks (around 03/27/2018) for Blood pressure.

## 2018-02-27 NOTE — Patient Instructions (Signed)
Hypertension Hypertension is another name for high blood pressure. High blood pressure forces your heart to work harder to pump blood. This can cause problems over time. There are two numbers in a blood pressure reading. There is a top number (systolic) over a bottom number (diastolic). It is best to have a blood pressure below 120/80. Healthy choices can help lower your blood pressure. You may need medicine to help lower your blood pressure if:  Your blood pressure cannot be lowered with healthy choices.  Your blood pressure is higher than 130/80.  Follow these instructions at home: Eating and drinking  If directed, follow the DASH eating plan. This diet includes: ? Filling half of your plate at each meal with fruits and vegetables. ? Filling one quarter of your plate at each meal with whole grains. Whole grains include whole wheat pasta, brown rice, and whole grain bread. ? Eating or drinking low-fat dairy products, such as skim milk or low-fat yogurt. ? Filling one quarter of your plate at each meal with low-fat (lean) proteins. Low-fat proteins include fish, skinless chicken, eggs, beans, and tofu. ? Avoiding fatty meat, cured and processed meat, or chicken with skin. ? Avoiding premade or processed food.  Eat less than 1,500 mg of salt (sodium) a day.  Limit alcohol use to no more than 1 drink a day for nonpregnant women and 2 drinks a day for men. One drink equals 12 oz of beer, 5 oz of wine, or 1 oz of hard liquor. Lifestyle  Work with your doctor to stay at a healthy weight or to lose weight. Ask your doctor what the best weight is for you.  Get at least 30 minutes of exercise that causes your heart to beat faster (aerobic exercise) most days of the week. This may include walking, swimming, or biking.  Get at least 30 minutes of exercise that strengthens your muscles (resistance exercise) at least 3 days a week. This may include lifting weights or pilates.  Do not use any  products that contain nicotine or tobacco. This includes cigarettes and e-cigarettes. If you need help quitting, ask your doctor.  Check your blood pressure at home as told by your doctor.  Keep all follow-up visits as told by your doctor. This is important. Medicines  Take over-the-counter and prescription medicines only as told by your doctor. Follow directions carefully.  Do not skip doses of blood pressure medicine. The medicine does not work as well if you skip doses. Skipping doses also puts you at risk for problems.  Ask your doctor about side effects or reactions to medicines that you should watch for. Contact a doctor if:  You think you are having a reaction to the medicine you are taking.  You have headaches that keep coming back (recurring).  You feel dizzy.  You have swelling in your ankles.  You have trouble with your vision. Get help right away if:  You get a very bad headache.  You start to feel confused.  You feel weak or numb.  You feel faint.  You get very bad pain in your: ? Chest. ? Belly (abdomen).  You throw up (vomit) more than once.  You have trouble breathing. Summary  Hypertension is another name for high blood pressure.  Making healthy choices can help lower blood pressure. If your blood pressure cannot be controlled with healthy choices, you may need to take medicine. This information is not intended to replace advice given to you by your health care   provider. Make sure you discuss any questions you have with your health care provider. Document Released: 10/30/2007 Document Revised: 04/10/2016 Document Reviewed: 04/10/2016 Elsevier Interactive Patient Education  2018 [employer].  Hypokalemia Hypokalemia means that the amount of potassium in the blood is lower than normal.Potassium is a chemical that helps regulate the amount of fluid in the body (electrolyte). It also stimulates muscle tightening (contraction) and helps nerves work  properly.Normally, most of the body's potassium is inside of cells, and only a very small amount is in the blood. Because the amount in the blood is so small, minor changes to potassium levels in the blood can be life-threatening. What are the causes? This condition may be caused by:  Antibiotic medicine.  Diarrhea or vomiting. Taking too much of a medicine that helps you have a bowel movement (laxative) can cause diarrhea and lead to hypokalemia.  Chronic kidney disease (CKD).  Medicines that help the body get rid of excess fluid (diuretics).  Eating disorders, such as bulimia.  Low magnesium levels in the body.  Sweating a lot.  What are the signs or symptoms? Symptoms of this condition include:  Weakness.  Constipation.  Fatigue.  Muscle cramps.  Mental confusion.  Skipped heartbeats or irregular heartbeat (palpitations).  Tingling or numbness.  How is this diagnosed? This condition is diagnosed with a blood test. How is this treated? Hypokalemia can be treated by taking potassium supplements by mouth or adjusting the medicines that you take. Treatment may also include eating more foods that contain a lot of potassium. If your potassium level is very low, you may need to get potassium through an IV tube in one of your veins and be monitored in the hospital. Follow these instructions at home:  Take over-the-counter and prescription medicines only as told by your health care provider. This includes vitamins and supplements.  Eat a healthy diet. A healthy diet includes fresh fruits and vegetables, whole grains, healthy fats, and lean proteins.  If instructed, eat more foods that contain a lot of potassium, such as: ? Nuts, such as peanuts and pistachios. ? Seeds, such as sunflower seeds and pumpkin seeds. ? Peas, lentils, and lima beans. ? Whole grain and bran cereals and breads. ? Fresh fruits and vegetables, such as apricots, avocado, bananas, cantaloupe, kiwi,  oranges, tomatoes, asparagus, and potatoes. ? Orange juice. ? Tomato juice. ? Red meats. ? Yogurt.  Keep all follow-up visits as told by your health care provider. This is important. Contact a health care provider if:  You have weakness that gets worse.  You feel your heart pounding or racing.  You vomit.  You have diarrhea.  You have diabetes (diabetes mellitus) and you have trouble keeping your blood sugar (glucose) in your target range. Get help right away if:  You have chest pain.  You have shortness of breath.  You have vomiting or diarrhea that lasts for more than 2 days.  You faint. This information is not intended to replace advice given to you by your health care provider. Make sure you discuss any questions you have with your health care provider. Document Released: 05/13/2005 Document Revised: 12/30/2015 Document Reviewed: 12/30/2015 Elsevier Interactive Patient Education  2018 [employer].

## 2018-02-28 LAB — BASIC METABOLIC PANEL WITH GFR
BUN/Creatinine Ratio: 14 (ref 9–23)
BUN: 10 mg/dL (ref 6–24)
CO2: 23 mmol/L (ref 20–29)
Calcium: 10.4 mg/dL — ABNORMAL HIGH (ref 8.7–10.2)
Chloride: 102 mmol/L (ref 96–106)
Creatinine, Ser: 0.73 mg/dL (ref 0.57–1.00)
GFR calc Af Amer: 109 mL/min/1.73
GFR calc non Af Amer: 94 mL/min/1.73
Glucose: 92 mg/dL (ref 65–99)
Potassium: 4.1 mmol/L (ref 3.5–5.2)
Sodium: 142 mmol/L (ref 134–144)

## 2018-03-06 ENCOUNTER — Telehealth: Payer: Self-pay | Admitting: *Deleted

## 2018-03-06 NOTE — Telephone Encounter (Signed)
MA unable to leave a message due to VM being full. Please inform patient of blood work being normal.

## 2018-03-27 ENCOUNTER — Encounter: Payer: Self-pay | Admitting: Family Medicine

## 2018-03-27 ENCOUNTER — Ambulatory Visit: Payer: Self-pay | Attending: Family Medicine | Admitting: Family Medicine

## 2018-03-27 ENCOUNTER — Other Ambulatory Visit: Payer: Self-pay

## 2018-03-27 VITALS — BP 138/85 | HR 92 | Temp 98.3°F | Resp 18 | Ht 68.0 in | Wt 171.8 lb

## 2018-03-27 DIAGNOSIS — F32A Depression, unspecified: Secondary | ICD-10-CM

## 2018-03-27 DIAGNOSIS — M545 Low back pain, unspecified: Secondary | ICD-10-CM

## 2018-03-27 DIAGNOSIS — Z87898 Personal history of other specified conditions: Secondary | ICD-10-CM

## 2018-03-27 DIAGNOSIS — M5137 Other intervertebral disc degeneration, lumbosacral region: Secondary | ICD-10-CM | POA: Insufficient documentation

## 2018-03-27 DIAGNOSIS — F329 Major depressive disorder, single episode, unspecified: Secondary | ICD-10-CM

## 2018-03-27 DIAGNOSIS — M5440 Lumbago with sciatica, unspecified side: Secondary | ICD-10-CM

## 2018-03-27 DIAGNOSIS — F419 Anxiety disorder, unspecified: Secondary | ICD-10-CM

## 2018-03-27 DIAGNOSIS — E876 Hypokalemia: Secondary | ICD-10-CM

## 2018-03-27 DIAGNOSIS — R42 Dizziness and giddiness: Secondary | ICD-10-CM

## 2018-03-27 DIAGNOSIS — I10 Essential (primary) hypertension: Secondary | ICD-10-CM

## 2018-03-27 DIAGNOSIS — F3131 Bipolar disorder, current episode depressed, mild: Secondary | ICD-10-CM

## 2018-03-27 DIAGNOSIS — Z8249 Family history of ischemic heart disease and other diseases of the circulatory system: Secondary | ICD-10-CM | POA: Insufficient documentation

## 2018-03-27 DIAGNOSIS — Z87891 Personal history of nicotine dependence: Secondary | ICD-10-CM | POA: Insufficient documentation

## 2018-03-27 DIAGNOSIS — Z79899 Other long term (current) drug therapy: Secondary | ICD-10-CM | POA: Insufficient documentation

## 2018-03-27 MED ORDER — GABAPENTIN 300 MG PO CAPS: 300.0000 mg | ORAL_CAPSULE | Freq: Every day | ORAL | 4 refills | Status: DC

## 2018-03-27 NOTE — Progress Notes (Signed)
Pain: lower back constant  and hip comes and goes area, months, 7.

## 2018-03-27 NOTE — Progress Notes (Signed)
Subjective:    Patient ID: Victoria Snyder, female    DOB: March 05, 1966, 52 y.o.   MRN: [id number]  HPI 52 year old female with history of hypertension. Patient was last seen on 02/27/18 and prescribed amlodipine 5 mg for hypertension and provided with information concerning the DASH diet.  Patient also had dizziness at that time and reported a history of vertigo.  Patient also complained of low back pain with radiation and patient was given prescription for Neurontin 100 mg to take 1 or 2 at bedtime as needed.  Patient also with history of hypokalemia therefore had electrolyte levels done at her last visit.  Patient presents today for follow-up of chronic medical issues.      Patient reports that she is taking the blood pressure medication without issues.  Patient has had no further issues with chest pain or sensation of fluttering in her chest similar to when her potassium level was low.  Patient is also being followed for her anxiety and depression as well as possible diagnosis of bipolar disorder at [facility] Patient has had infrequent dizziness.  Patient has found that meclizine does help.  Patient denies any episodes of focal numbness or weakness.  Patient is taking her blood pressure medicine on a daily basis.  Patient denies any headaches related to her blood pressure.  Patient reports that she does continue to have low back pain with radiation down the right leg but feels that Neurontin has slightly helped but she would like a higher dose.  Back pain at today's visit is about a 4 on a 0-to-10 scale but can be higher at times especially when she has increased radiating pain that is burning and stabbing in nature. Past Medical History:  Diagnosis Date  . Bipolar disorder ([place])   . Blood transfusion without reported diagnosis   . Hypertension   . Hypokalemia   . Low back pain with radiation   . Vertigo    Past Surgical History:  Procedure Laterality Date  . ABDOMINAL HYSTERECTOMY     Family  History  Problem Relation Age of Onset  . Heart disease Mother        heart attack  . Stroke Father   . Social History   Tobacco Use  . Smoking status: Former Research scientist (life sciences)  . Smokeless tobacco: Never Used  Substance Use Topics  . Alcohol use: Not Currently    Alcohol/week: 1.0 - 2.0 standard drinks    Types: 1 - 2 Standard drinks or equivalent per week  . Drug use: No  No Known Allergies     Review of Systems  Constitutional: Positive for fatigue. Negative for chills and fever.  HENT: Negative for sore throat and trouble swallowing.   Respiratory: Negative for cough and shortness of breath.   Cardiovascular: Negative for chest pain and leg swelling.  Gastrointestinal: Negative for abdominal pain and nausea.  Genitourinary: Negative for dysuria and frequency.  Musculoskeletal: Positive for arthralgias, back pain and joint swelling. Negative for gait problem.  Neurological: Positive for dizziness. Negative for weakness, numbness and headaches.  Hematological: Negative for adenopathy. Does not bruise/bleed easily.  Psychiatric/Behavioral: Negative for self-injury, sleep disturbance and suicidal ideas. The patient is nervous/anxious.        Objective:   Physical Exam  Constitutional: She is oriented to person, place, and time. She appears well-developed and well-nourished. No distress.  HENT:  Right Ear: External ear normal.  Left Ear: External ear normal.  Nose: Nose normal.  Mouth/Throat: Oropharynx is clear  and moist.  Neck: Normal range of motion. Neck supple. No JVD present.  Cardiovascular: Normal rate and regular rhythm.  Pulmonary/Chest: Effort normal and breath sounds normal.  Abdominal: Soft. There is no tenderness.  Musculoskeletal: She exhibits tenderness (positive lumbosacral tenderness and positive seated right leg raise). She exhibits no edema.  Lymphadenopathy:    She has no cervical adenopathy.  Neurological: She is alert and oriented to person, place, and  time. No cranial nerve deficit.   BP 138/85   Pulse 92   Temp 98.3 F (36.8 C) (Oral)   Resp 18   Ht 5\' 8"  (1.727 m)   Wt 171 lb 12.8 oz (77.9 kg)   SpO2 98%   BMI 26.12 kg/m nurses notes and vital signs reviewed        Assessment & Plan:  1. Essential hypertension Patient's blood pressure is adequately controlled.  Blood pressure today's visit is 138/85.  Patient is encouraged to continue a low-sodium/-type diet as well as her current medication, amlodipine 5 mg. - Basic Metabolic Panel  2. Dizziness Patient will have BMP in follow-up of dizziness to look for possible electrolyte abnormality.  Continue use of meclizine as needed. - Basic Metabolic Panel  3. Low back pain with radiation Patient had lumbar spine film in 2014 on review of chart which showed lumbar degenerative disc disease at L5-S1.  Patient is provided with prescription for Neurontin for treatment of neuropathic pain related to sciatica/lumbar degenerative disc disease. - gabapentin (NEURONTIN) 300 MG capsule; Take 1 capsule (300 mg total) by mouth at bedtime.  Dispense: 30 capsule; Refill: 4  4. Hypokalemia Patient with history of hypokalemia and patient will have recheck of electrolytes at today's visit. - Basic Metabolic Panel been  An After Visit Summary was printed and given to the patient.  Return in about 4 months (around 07/26/2018) for htn.

## 2018-03-28 LAB — BASIC METABOLIC PANEL WITH GFR
BUN/Creatinine Ratio: 16 (ref 9–23)
BUN: 13 mg/dL (ref 6–24)
CO2: 26 mmol/L (ref 20–29)
Calcium: 10.3 mg/dL — ABNORMAL HIGH (ref 8.7–10.2)
Chloride: 102 mmol/L (ref 96–106)
Creatinine, Ser: 0.8 mg/dL (ref 0.57–1.00)
GFR calc Af Amer: 97 mL/min/1.73
GFR calc non Af Amer: 84 mL/min/1.73
Glucose: 67 mg/dL (ref 65–99)
Potassium: 4.6 mmol/L (ref 3.5–5.2)
Sodium: 145 mmol/L — ABNORMAL HIGH (ref 134–144)

## 2018-03-31 ENCOUNTER — Encounter: Payer: Self-pay | Admitting: Family Medicine

## 2018-03-31 DIAGNOSIS — F32A Depression, unspecified: Secondary | ICD-10-CM | POA: Insufficient documentation

## 2018-03-31 DIAGNOSIS — F419 Anxiety disorder, unspecified: Secondary | ICD-10-CM | POA: Insufficient documentation

## 2018-03-31 DIAGNOSIS — M5440 Lumbago with sciatica, unspecified side: Secondary | ICD-10-CM

## 2018-03-31 DIAGNOSIS — M545 Low back pain, unspecified: Secondary | ICD-10-CM | POA: Insufficient documentation

## 2018-03-31 DIAGNOSIS — I10 Essential (primary) hypertension: Secondary | ICD-10-CM | POA: Insufficient documentation

## 2018-03-31 DIAGNOSIS — Z87898 Personal history of other specified conditions: Secondary | ICD-10-CM | POA: Insufficient documentation

## 2018-03-31 DIAGNOSIS — F319 Bipolar disorder, unspecified: Secondary | ICD-10-CM | POA: Insufficient documentation

## 2018-03-31 DIAGNOSIS — F329 Major depressive disorder, single episode, unspecified: Secondary | ICD-10-CM | POA: Insufficient documentation

## 2018-06-05 ENCOUNTER — Encounter: Payer: Self-pay | Admitting: Family Medicine

## 2018-06-05 ENCOUNTER — Ambulatory Visit: Payer: Self-pay | Attending: Family Medicine | Admitting: Family Medicine

## 2018-06-05 VITALS — BP 124/84 | HR 82 | Temp 98.2°F | Resp 18 | Ht 69.0 in | Wt 167.0 lb

## 2018-06-05 DIAGNOSIS — Z7901 Long term (current) use of anticoagulants: Secondary | ICD-10-CM | POA: Insufficient documentation

## 2018-06-05 DIAGNOSIS — M25561 Pain in right knee: Secondary | ICD-10-CM

## 2018-06-05 DIAGNOSIS — M47816 Spondylosis without myelopathy or radiculopathy, lumbar region: Secondary | ICD-10-CM | POA: Insufficient documentation

## 2018-06-05 DIAGNOSIS — W19XXXA Unspecified fall, initial encounter: Secondary | ICD-10-CM | POA: Insufficient documentation

## 2018-06-05 DIAGNOSIS — M5126 Other intervertebral disc displacement, lumbar region: Secondary | ICD-10-CM

## 2018-06-05 DIAGNOSIS — M549 Dorsalgia, unspecified: Secondary | ICD-10-CM | POA: Insufficient documentation

## 2018-06-05 DIAGNOSIS — M5136 Other intervertebral disc degeneration, lumbar region: Secondary | ICD-10-CM

## 2018-06-05 DIAGNOSIS — I959 Hypotension, unspecified: Secondary | ICD-10-CM | POA: Insufficient documentation

## 2018-06-05 DIAGNOSIS — Z79899 Other long term (current) drug therapy: Secondary | ICD-10-CM | POA: Insufficient documentation

## 2018-06-05 DIAGNOSIS — I10 Essential (primary) hypertension: Secondary | ICD-10-CM

## 2018-06-05 MED ORDER — CETIRIZINE HCL 10 MG PO TABS: 10.0000 mg | ORAL_TABLET | Freq: Every day | ORAL | 1 refills | Status: DC

## 2018-06-05 MED ORDER — AMLODIPINE BESYLATE 5 MG PO TABS: 5.0000 mg | ORAL_TABLET | Freq: Every day | ORAL | 3 refills | Status: DC

## 2018-06-05 MED ORDER — TIZANIDINE HCL 4 MG PO TABS: 4.0000 mg | ORAL_TABLET | Freq: Three times a day (TID) | ORAL | 1 refills | Status: DC | PRN

## 2018-06-05 MED ORDER — PREDNISONE 20 MG PO TABS: 20.0000 mg | ORAL_TABLET | Freq: Two times a day (BID) | ORAL | 0 refills | Status: DC

## 2018-06-05 NOTE — Patient Instructions (Signed)

## 2018-06-05 NOTE — Progress Notes (Signed)
Subjective:  Patient ID: Victoria Snyder, female    DOB: [birth year]  Age: 53 y.o. MRN: [id number]  CC: Back Pain   HPI Victoria Snyder is a 53 year old female with a history of hypertension who presents today complaining of right knee pain after she took a fall a few days ago.  She suffers from chronic right sided low back pain and felt her right hip catch and she fell forward with a twisting motion of her right knee and she also heard a pop sound in her knee. He has been swollen ever since then she has used a knee brace and applied ice.  Pain is rated as a 5/10 at this time and does not radiate. Low back pain is rated as an 8/10 and radiates down her right lower extremities intermittently.  Review of MRI of her lumbar spine from 04/2009 revealed: IMPRESSION:   Lower lumbar spondylosis, most severe at L4-L5.  At this level, there is a central canal and lateral recess narrowing secondary to a broad-based disc protrusion which is superimposed upon a diffuse bulge.   More mild spondylosis, including L3-L4, as described.  She also complains of right ear sensations which she describes as static sensations in her right ear but denies the presence of pain.  She intermittently has maxillary sinus pressure but no other upper respiratory symptoms.  Past Medical History:  Diagnosis Date  . Bipolar disorder ([place])   . Blood transfusion without reported diagnosis   . Hypertension   . Hypokalemia   . Low back pain with radiation   . Vertigo     Past Surgical History:  Procedure Laterality Date  . ABDOMINAL HYSTERECTOMY      No Known Allergies   Outpatient Medications Prior to Visit  Medication Sig Dispense Refill  . Ascorbic Acid (VITAMIN C) 1000 MG tablet Take 1,000 mg by mouth daily.    . busPIRone (BUSPAR) 15 MG tablet Take 1 tablet (15 mg total) by mouth 3 (three) times daily. 90 tablet 0  . escitalopram (LEXAPRO) 5 MG tablet Take 1 tablet (5 mg total) by mouth daily. 30 tablet 3   . fluticasone (FLONASE) 50 MCG/ACT nasal spray Place 1 spray into both nostrils daily. 16 g 2  . gabapentin (NEURONTIN) 300 MG capsule Take 1 capsule (300 mg total) by mouth at bedtime. 30 capsule 4  . hydrOXYzine (ATARAX/VISTARIL) 10 MG tablet Take 1-3 tablets (10-30 mg total) by mouth 3 (three) times daily as needed. (Patient taking differently: Take 10-30 mg by mouth 3 (three) times daily as needed for anxiety. ) 90 tablet 0  . meclizine (ANTIVERT) 25 MG tablet Take 1 tablet (25 mg total) by mouth 3 (three) times daily as needed for dizziness. 30 tablet 3  . Multiple Vitamins-Minerals (MULTIVITAMIN WITH MINERALS) tablet Take 1 tablet by mouth daily.    Marland Kitchen amLODipine (NORVASC) 5 MG tablet Take 1 tablet (5 mg total) by mouth daily. To lower blood pressure 30 tablet 3   No facility-administered medications prior to visit.     ROS Review of Systems  Constitutional: Negative for activity change, appetite change and fatigue.  HENT: Negative for congestion, sinus pressure and sore throat.   Eyes: Negative for visual disturbance.  Respiratory: Negative for cough, chest tightness, shortness of breath and wheezing.   Cardiovascular: Negative for chest pain and palpitations.  Gastrointestinal: Negative for abdominal distention, abdominal pain and constipation.  Endocrine: Negative for polydipsia.  Genitourinary: Negative for dysuria and frequency.  Musculoskeletal:  See hpi  Skin: Negative for rash.  Neurological: Negative for tremors, light-headedness and numbness.  Hematological: Does not bruise/bleed easily.  Psychiatric/Behavioral: Negative for agitation and behavioral problems.    Objective:  BP 124/84 (BP Location: Right Arm, Patient Position: Sitting, Cuff Size: Normal)   Pulse 82   Temp 98.2 F (36.8 C) (Oral)   Resp 18   Ht 5\' 9"  (1.753 m)   Wt 167 lb (75.8 kg)   SpO2 98%   BMI 24.66 kg/m   BP/Weight 06/05/2018 03/27/2018 31/08/9700  Systolic BP 637 858 850  Diastolic BP  84 85 90  Wt. (Lbs) 167 171.8 163  BMI 24.66 26.12 24.07      Physical Exam Constitutional:      Appearance: She is well-developed.  Cardiovascular:     Rate and Rhythm: Normal rate.     Heart sounds: Normal heart sounds. No murmur.  Pulmonary:     Effort: Pulmonary effort is normal.     Breath sounds: Normal breath sounds. No wheezing or rales.  Chest:     Chest wall: No tenderness.  Abdominal:     General: Bowel sounds are normal. There is no distension.     Palpations: Abdomen is soft. There is no mass.     Tenderness: There is no abdominal tenderness.  Musculoskeletal:     Comments: Right knee edema with tenderness on medial aspect of the knee Negative anterior drawer, Lachman's, McMurray's sign Slight tenderness on flexion and extension  No tenderness on palpation of lumbar spine; negative straight leg raise bilaterally  Neurological:     Mental Status: She is alert and oriented to person, place, and time.  Psychiatric:        Mood and Affect: Mood normal.        Behavior: Behavior normal.      Assessment & Plan:   1. Essential hypertension Controlled Low-sodium, DASH diet - amLODipine (NORVASC) 5 MG tablet; Take 1 tablet (5 mg total) by mouth daily. To lower blood pressure  Dispense: 30 tablet; Refill: 3  2. Acute pain of right knee Secondary to recent trauma Apply ice, knee brace, elevate - predniSONE (DELTASONE) 20 MG tablet; Take 1 tablet (20 mg total) by mouth 2 (two) times daily with a meal.  Dispense: 10 tablet; Refill: 0  3. Bulging of lumbar intervertebral disc Uncontrolled Muscle relaxant added to regimen, continue gabapentin May benefit from Cymbalta-advised to discuss with psychiatrist regarding switching from Lexapro to Cymbalta If symptoms persist she may need to be evaluated for epidural spinal injections but will defer to PCP - tiZANidine (ZANAFLEX) 4 MG tablet; Take 1 tablet (4 mg total) by mouth every 8 (eight) hours as needed for muscle  spasms.  Dispense: 60 tablet; Refill: 1  Placed on Zyrtec for right ear symptoms  Meds ordered this encounter  Medications  . predniSONE (DELTASONE) 20 MG tablet    Sig: Take 1 tablet (20 mg total) by mouth 2 (two) times daily with a meal.    Dispense:  10 tablet    Refill:  0  . tiZANidine (ZANAFLEX) 4 MG tablet    Sig: Take 1 tablet (4 mg total) by mouth every 8 (eight) hours as needed for muscle spasms.    Dispense:  60 tablet    Refill:  1  . amLODipine (NORVASC) 5 MG tablet    Sig: Take 1 tablet (5 mg total) by mouth daily. To lower blood pressure    Dispense:  30 tablet  Refill:  3    Follow-up: Return in about 3 weeks (around 06/26/2018) for follow up on back and knee pain with PCP - Dr Chapman Fitch.   Charlott Rakes MD

## 2018-06-17 ENCOUNTER — Telehealth: Payer: Self-pay | Admitting: Family Medicine

## 2018-06-17 NOTE — Telephone Encounter (Signed)
1) Medication(s) Requested (by name): Zyrtec norvasc Deltasone zanaflex 2) Pharmacy of Choice:  [facility] on [address]   *pharmacy says that they did not receive the prescriptions

## 2018-06-18 NOTE — Telephone Encounter (Signed)
Pharmacy reports she picked up RX's yesterday, there was a clerical error on behalf of the pharmacy that they have now resolved.

## 2018-07-27 ENCOUNTER — Ambulatory Visit: Payer: Self-pay | Attending: Family Medicine | Admitting: Family Medicine

## 2018-07-27 ENCOUNTER — Encounter: Payer: Self-pay | Admitting: Family Medicine

## 2018-07-27 VITALS — BP 122/88 | HR 74 | Temp 98.9°F | Resp 18 | Ht 69.0 in | Wt 169.0 lb

## 2018-07-27 DIAGNOSIS — R42 Dizziness and giddiness: Secondary | ICD-10-CM | POA: Insufficient documentation

## 2018-07-27 DIAGNOSIS — M545 Low back pain, unspecified: Secondary | ICD-10-CM

## 2018-07-27 DIAGNOSIS — M5440 Lumbago with sciatica, unspecified side: Secondary | ICD-10-CM

## 2018-07-27 DIAGNOSIS — F319 Bipolar disorder, unspecified: Secondary | ICD-10-CM | POA: Insufficient documentation

## 2018-07-27 DIAGNOSIS — M5441 Lumbago with sciatica, right side: Secondary | ICD-10-CM

## 2018-07-27 DIAGNOSIS — I10 Essential (primary) hypertension: Secondary | ICD-10-CM | POA: Insufficient documentation

## 2018-07-27 DIAGNOSIS — G8929 Other chronic pain: Secondary | ICD-10-CM | POA: Insufficient documentation

## 2018-07-27 DIAGNOSIS — Z87891 Personal history of nicotine dependence: Secondary | ICD-10-CM | POA: Insufficient documentation

## 2018-07-27 DIAGNOSIS — M25561 Pain in right knee: Secondary | ICD-10-CM

## 2018-07-27 DIAGNOSIS — Z8249 Family history of ischemic heart disease and other diseases of the circulatory system: Secondary | ICD-10-CM | POA: Insufficient documentation

## 2018-07-27 DIAGNOSIS — Z79899 Other long term (current) drug therapy: Secondary | ICD-10-CM | POA: Insufficient documentation

## 2018-07-27 MED ORDER — IBUPROFEN 600 MG PO TABS: 600.0000 mg | ORAL_TABLET | Freq: Three times a day (TID) | ORAL | 1 refills | Status: DC | PRN

## 2018-07-27 MED ORDER — DICLOFENAC SODIUM 1 % TD GEL: 4.0000 g | Freq: Four times a day (QID) | TRANSDERMAL | 5 refills | Status: DC

## 2018-07-27 MED ORDER — GABAPENTIN 300 MG PO CAPS: 300.0000 mg | ORAL_CAPSULE | Freq: Three times a day (TID) | ORAL | 5 refills | Status: DC

## 2018-07-27 NOTE — Patient Instructions (Signed)
  Your dose of gabapentin was increased so that you can take it in the morning, afternoon or early evening as well as at bedtime to help with the pain that goes from your back down your leg.  Please remember that gabapentin can cause some dizziness.  Prescriptions also given for ibuprofen 600 mg to every 8 hours as needed for moderate to severe pain.  Please eat prior to taking the ibuprofen.  Prescription provided for diclofenac gel which you can rub on painful knee areas up to 4 times daily to help with knee pain.  If your symptoms have not improved, please obtain x-rays of your lower back and knee.  Orders have already been placed for your x-rays.  Please follow-up in 4 weeks but return sooner if you have no improvement or worsening of pain

## 2018-07-27 NOTE — Progress Notes (Signed)
Patient complains of chronic right hip and knee pain.  Patient states she fell 2-3 months ago from her hip "catching". Patient complains ever since the pain has increased. Patient has not taken BP medication since Friday and admits to forgetting to take them regularly. Patient had coffee with cream and sugar this morning.

## 2018-07-27 NOTE — Progress Notes (Signed)
Established Patient Office Visit  Subjective:  Patient ID: Victoria Snyder, female    DOB: 06/19/1965  Age: 53 y.o. MRN: [id number]  CC:  Chief Complaint  Patient presents with  . Follow-up    Hip/Knee Pain    HPI DORY DEMONT presents for follow-up from a visit on 06/05/2018 due to acute right knee pain and swelling  and acute on chronic low back pain with radiation( s/p fall. Patient was treated with a prednisone taper per her last note.  Patient reports that her right knee is less swollen.  She has been icing her knee.  Patient has had some mild improvement in knee pain.  Patient states that her knee pain ranges between a 6 and a 9.  Knee pain can be throbbing at times.  Occasionally has worsening pain at night.  Pain is mostly on the inside of the right knee.  Patient reports continued issues with pain in her mid lower back as well as right side of the low back which seems to radiate down her right leg below the knee.  Back pain ranges from a 5 in its lowest up to 10.  Patient also has sensation of catching in her right hip.  Patient will feel as if she cannot really move her hip and she states that this is what caused her to have her initial fall.  Patient states that she had stood up from a seated position felt as if she had a catch in her hip and tried to brace herself but then fell.  Patient states that she fell forward and to the right.  Patient describes the pain as sharp in her back and radiates down her leg.  Patient does take gabapentin at nighttime with some decrease in pain.  Patient has tried some over-the-counter ibuprofen with some decrease in pain.  Patient has had back pain off and on for years.  Patient does not recall a particular incident which started her back pain.  Patient denies any loss of bowel or bladder function associated with onset of back pain.  Past Medical History:  Diagnosis Date  . Bipolar disorder ([place])   . Blood transfusion without reported diagnosis   .  Hypertension   . Hypokalemia   . Low back pain with radiation   . Vertigo     Past Surgical History:  Procedure Laterality Date  . ABDOMINAL HYSTERECTOMY      Family History  Problem Relation Age of Onset  . Heart disease Mother        heart attack  . Stroke Father    Social History   Tobacco Use  . Smoking status: Former Research scientist (life sciences)  . Smokeless tobacco: Never Used  Substance Use Topics  . Alcohol use: Not Currently    Alcohol/week: 1.0 - 2.0 standard drinks    Types: 1 - 2 Standard drinks or equivalent per week  . Drug use: No    Outpatient Medications Prior to Visit  Medication Sig Dispense Refill  . amLODipine (NORVASC) 5 MG tablet Take 1 tablet (5 mg total) by mouth daily. To lower blood pressure 30 tablet 3  . Ascorbic Acid (VITAMIN C) 1000 MG tablet Take 1,000 mg by mouth daily.    . busPIRone (BUSPAR) 15 MG tablet Take 1 tablet (15 mg total) by mouth 3 (three) times daily. 90 tablet 0  . cetirizine (ZYRTEC) 10 MG tablet Take 1 tablet (10 mg total) by mouth daily. 30 tablet 1  . escitalopram (LEXAPRO)  5 MG tablet Take 1 tablet (5 mg total) by mouth daily. 30 tablet 3  . fluticasone (FLONASE) 50 MCG/ACT nasal spray Place 1 spray into both nostrils daily. 16 g 2  . gabapentin (NEURONTIN) 300 MG capsule Take 1 capsule (300 mg total) by mouth at bedtime. 30 capsule 4  . hydrOXYzine (ATARAX/VISTARIL) 10 MG tablet Take 1-3 tablets (10-30 mg total) by mouth 3 (three) times daily as needed. (Patient taking differently: Take 10-30 mg by mouth 3 (three) times daily as needed for anxiety. ) 90 tablet 0  . meclizine (ANTIVERT) 25 MG tablet Take 1 tablet (25 mg total) by mouth 3 (three) times daily as needed for dizziness. 30 tablet 3  . Multiple Vitamins-Minerals (MULTIVITAMIN WITH MINERALS) tablet Take 1 tablet by mouth daily.    . predniSONE (DELTASONE) 20 MG tablet Take 1 tablet (20 mg total) by mouth 2 (two) times daily with a meal. 10 tablet 0  . tiZANidine (ZANAFLEX) 4 MG tablet  Take 1 tablet (4 mg total) by mouth every 8 (eight) hours as needed for muscle spasms. 60 tablet 1   No facility-administered medications prior to visit.     No Known Allergies  ROS Review of Systems  Constitutional: Positive for fatigue. Negative for chills and fever.  HENT: Negative for hearing loss and trouble swallowing.   Respiratory: Negative for cough and shortness of breath.   Cardiovascular: Negative for chest pain, palpitations and leg swelling.  Gastrointestinal: Negative for abdominal pain, blood in stool, constipation, diarrhea and nausea.  Endocrine: Negative for polydipsia, polyphagia and polyuria.  Genitourinary: Negative for dysuria and frequency.  Musculoskeletal: Positive for arthralgias and back pain.  Neurological: Positive for dizziness (History of recurrent vertigo). Negative for headaches.  Hematological: Negative for adenopathy. Does not bruise/bleed easily.  Psychiatric/Behavioral: Negative for self-injury and suicidal ideas.      Objective:    Physical Exam  General-well-nourished, well-developed female in no acute distress Lungs-clear to auscultation bilaterally Cardiovascular-regular rate and regular rhythm Abdomen-soft, nontender Back-no CVA tenderness, patient with lumbosacral discomfort to palpation from approximately L4-S1 and patient with bilateral lumbosacral paraspinous spasm.  Patient with right SI joint tenderness and positive right seated leg raise. Musculoskeletal-patient does not appear to have any effusion of the knee at today's visit.  Patient with tenderness along the right medial joint line of the knee as well as right medial collateral ligament of the knee area.  Knee joint is otherwise stable Psych- patient with slightly flattened affect (she reports that she has follow-up tomorrow with her counselor at [facility])  BP (!) 143/91 (BP Location: Left Arm, Patient Position: Sitting, Cuff Size: Normal)   Pulse 74   Temp 98.9 F  (37.2 C) (Oral)   Resp 18   Ht 5\' 9"  (1.753 m)   Wt 169 lb (76.7 kg)   SpO2 98%   BMI 24.96 kg/m   Repeat BP 122/88 Wt Readings from Last 3 Encounters:  07/27/18 169 lb (76.7 kg)  06/05/18 167 lb (75.8 kg)  03/27/18 171 lb 12.8 oz (77.9 kg)     Health Maintenance Due  Topic Date Due  . HIV Screening  10/13/1979  . TETANUS/TDAP  10/13/1983  . PAP SMEAR-Modifier  10/12/1985  . COLONOSCOPY  10/13/2014  . MAMMOGRAM  12/15/2014    There are no preventive care reminders to display for this patient.  Lab Results  Component Value Date   TSH 1.840 10/22/2017   Lab Results  Component Value Date   WBC  5.9 12/13/2017   HGB 16.3 (H) 12/13/2017   HCT 46.5 (H) 12/13/2017   MCV 88.6 12/13/2017   PLT 302 12/13/2017   Lab Results  Component Value Date   NA 145 (H) 03/27/2018   K 4.6 03/27/2018   CO2 26 03/27/2018   GLUCOSE 67 03/27/2018   BUN 13 03/27/2018   CREATININE 0.80 03/27/2018   BILITOT 0.6 10/22/2017   ALKPHOS 87 10/22/2017   AST 20 10/22/2017   ALT 20 10/22/2017   PROT 7.6 10/22/2017   ALBUMIN 4.9 10/22/2017   CALCIUM 10.3 (H) 03/27/2018   ANIONGAP 13 12/13/2017   Lab Results  Component Value Date   CHOL 203 (H) 10/22/2017   Lab Results  Component Value Date   HDL 79 10/22/2017   Lab Results  Component Value Date   LDLCALC 99 10/22/2017   Lab Results  Component Value Date   TRIG 123 10/22/2017   Lab Results  Component Value Date   CHOLHDL 2.6 10/22/2017   Lab Results  Component Value Date   HGBA1C 5.2 10/22/2017      Assessment & Plan:  1. Acute pain of right knee Patient with acute knee pain status post recent fall.  Patient reports the knee swelling has resolved with the use of icing.  Patient did get some decrease in back and knee pain after prednisone taper at her last visit.  Patient is being provided with prescription for ibuprofen 600 mg which she may take up to 1 every 8 hours as needed for moderate pain.  Patient should take after  eating.  Patient also given prescription for Voltaren gel to apply directly to the site of pain at her knee.  If her knee pain is not improving over the next 2 weeks, patient should obtain an x-ray of her knee as well as the lower back and return to clinic for further evaluation. - ibuprofen (ADVIL,MOTRIN) 600 MG tablet; Take 1 tablet (600 mg total) by mouth every 8 (eight) hours as needed for moderate pain. Eat before taking medication  Dispense: 60 tablet; Refill: 1 - diclofenac sodium (VOLTAREN) 1 % GEL; Apply 4 g topically 4 (four) times daily. To help with knee pain  Dispense: 1 Tube; Refill: 5 - DG Knee Complete 4 Views Right; Future  2. Low back pain with radiation Patient with acute on chronic low back pain with radiation status post recent fall.  Patient can increase gabapentin up to 3 times daily and patient reminded that the medication can cause some dizziness/drowsiness.  Patient also provided with prescription for ibuprofen 600 mg to take as needed for low back pain with radiation and knee pain.  Order placed for patient to have an x-ray of the lumbar spine if she continues to have back pain with radiation.  Discussed possibility of orthopedic referral for back pain and knee pain if her symptoms continue.  Patient will follow-up in 4 weeks but should call or return sooner if her symptoms are not improving or if she has any worsening of symptoms. - gabapentin (NEURONTIN) 300 MG capsule; Take 1 capsule (300 mg total) by mouth 3 (three) times daily. To help with back pain with radiation  Dispense: 90 capsule; Refill: 5 - ibuprofen (ADVIL,MOTRIN) 600 MG tablet; Take 1 tablet (600 mg total) by mouth every 8 (eight) hours as needed for moderate pain. Eat before taking medication  Dispense: 60 tablet; Refill: 1 - DG Lumbar Spine Complete; Future     Follow-up: Return in about 4 weeks (  around 08/24/2018) for knee and back pain; sooner if needed.   Antony Blackbird, MD

## 2018-07-31 ENCOUNTER — Ambulatory Visit (HOSPITAL_COMMUNITY)
Admission: RE | Admit: 2018-07-31 | Discharge: 2018-07-31 | Disposition: A | Discharge status: Home / Self Care | Payer: Medicaid Other | Source: Ambulatory Visit | Attending: Family Medicine | Admitting: Family Medicine

## 2018-07-31 DIAGNOSIS — M545 Low back pain, unspecified: Secondary | ICD-10-CM

## 2018-07-31 DIAGNOSIS — M25561 Pain in right knee: Secondary | ICD-10-CM | POA: Insufficient documentation

## 2018-07-31 DIAGNOSIS — M5440 Lumbago with sciatica, unspecified side: Secondary | ICD-10-CM | POA: Insufficient documentation

## 2018-08-03 ENCOUNTER — Telehealth: Payer: Self-pay | Admitting: *Deleted

## 2018-08-03 DIAGNOSIS — M25561 Pain in right knee: Secondary | ICD-10-CM

## 2018-08-03 NOTE — Telephone Encounter (Signed)
Patient verified DOB Patient is aware of no new changes being noted. Patient is aware of old ligament tear being noted and a referral was placed to ortho to address the concern.

## 2018-08-03 NOTE — Telephone Encounter (Signed)
-----   Message from Antony Blackbird, MD sent at 08/02/2018 10:12 PM EDT ----- Xray of the right knee shows no acute abnormality but possible old avulsion fracture from the origin of the medial collateral ligament. Please offer referral to Orthopedics if patient is continuing to have knee pain

## 2018-08-05 ENCOUNTER — Telehealth: Payer: Self-pay | Admitting: *Deleted

## 2018-08-05 NOTE — Telephone Encounter (Signed)
-----   Message from Antony Blackbird, MD sent at 08/04/2018  9:18 PM EDT ----- Degenerative changes which are most evident at L5-S1 (moderate loss of disc height with endplate sclerosis and osteophytes at L5-S1)

## 2018-08-05 NOTE — Telephone Encounter (Signed)
Patient verified DOB Patient is aware of degenerative changes being noted and a referral to orthopedics was placed. Patient was provided the information for [facility] and will call to schedule. No further questions.

## 2018-08-12 MED FILL — IBUPROFEN 600 MG TABLET: 600 | 20 days supply | Qty: 60 | Fill #0

## 2018-08-12 MED FILL — GABAPENTIN 300 MG CAPSULE: 300 | 30 days supply | Qty: 90 | Fill #0

## 2018-08-12 MED FILL — DICLOFENAC SODIUM 1% GEL: 1 | 6 days supply | Qty: 100 | Fill #0

## 2018-08-12 MED FILL — AMLODIPINE BESYLATE 5 MG TA: 5 | 30 days supply | Qty: 30 | Fill #0

## 2018-08-13 ENCOUNTER — Ambulatory Visit (INDEPENDENT_AMBULATORY_CARE_PROVIDER_SITE_OTHER): Payer: Self-pay | Admitting: Orthopaedic Surgery

## 2018-08-13 ENCOUNTER — Encounter (INDEPENDENT_AMBULATORY_CARE_PROVIDER_SITE_OTHER): Payer: Self-pay

## 2018-08-13 ENCOUNTER — Other Ambulatory Visit: Payer: Self-pay

## 2018-08-13 ENCOUNTER — Encounter (INDEPENDENT_AMBULATORY_CARE_PROVIDER_SITE_OTHER): Payer: Self-pay | Admitting: Orthopaedic Surgery

## 2018-08-13 ENCOUNTER — Ambulatory Visit (INDEPENDENT_AMBULATORY_CARE_PROVIDER_SITE_OTHER): Payer: Medicaid Other | Admitting: Orthopaedic Surgery

## 2018-08-13 ENCOUNTER — Ambulatory Visit (INDEPENDENT_AMBULATORY_CARE_PROVIDER_SITE_OTHER): Payer: Self-pay

## 2018-08-13 VITALS — BP 130/103 | HR 88 | Ht 69.0 in | Wt 169.0 lb

## 2018-08-13 DIAGNOSIS — M545 Low back pain, unspecified: Secondary | ICD-10-CM

## 2018-08-13 DIAGNOSIS — M25561 Pain in right knee: Secondary | ICD-10-CM

## 2018-08-13 DIAGNOSIS — M25551 Pain in right hip: Secondary | ICD-10-CM

## 2018-08-13 DIAGNOSIS — M5137 Other intervertebral disc degeneration, lumbosacral region: Secondary | ICD-10-CM

## 2018-08-13 DIAGNOSIS — M5136 Other intervertebral disc degeneration, lumbar region: Secondary | ICD-10-CM

## 2018-08-13 DIAGNOSIS — M1611 Unilateral primary osteoarthritis, right hip: Secondary | ICD-10-CM

## 2018-08-13 NOTE — Progress Notes (Signed)
Office Visit Note   Patient: Victoria Snyder           Date of Birth: 1965/09/11           MRN: [id number] Visit Date: 08/13/2018              Requested by: Antony Blackbird, MD [place], [place] [address] PCP: Antony Blackbird, MD   Assessment & Plan: Visit Diagnoses:  1. DDD (degenerative disc disease), lumbosacral   2. Bilateral low back pain, unspecified chronicity, unspecified whether sciatica present   3. Pain of right hip joint   4. Unilateral primary osteoarthritis, right hip   5. Right knee pain, unspecified chronicity   6. Other intervertebral disc degeneration, lumbar region     Plan:  #1: MRI scan lumbar spine looking particularly at the L5-S1 area. #2: Cortisone injection to the right hip intra-articular once able to have this performed which will be delayed by the Corona virus. #3: Follow back up to scan.  Follow-Up Instructions: Return in about 3 weeks (around 09/03/2018).   Face-to-face time spent with patient was greater than 30 minutes.  Greater than 50% of the time was spent in counseling and coordination of care.  Orders:  Orders Placed This Encounter  Procedures  . XR HIP UNILAT W OR W/O PELVIS 2-3 VIEWS RIGHT  . MR Lumbar Spine w/o contrast   No orders of the defined types were placed in this encounter.     Procedures: No procedures performed   Clinical Data: No additional findings.   Subjective: Chief Complaint  Patient presents with  . Right Knee - Pain  . Lower Back - Pain  Patient presents today with right knee and lower back pain. She fell 3 months ago and twisted her knee. She is having medial knee pain that gets worse with prolonged weightbearing or flexion. She initially has swelling, but that has improved some. She also complains of back pain that has worsened in the last three months. She has had lower back pain for a long time, but the fall made it worse. She said that it radiates to both hips, but worse on the right side.  She said that her hips feel heavy. She has a difficult time sleeping. She saw her PCP earlier this month and had x-rays of her lower back and right knee.     She did have some improvement in the knee with decreased effusion.  However she continued to have pain.  She also had low back pain with radicular type symptoms to the right to be 5-10 out of 10.  Also has some catching sensation in her right hip at that visit.  She was evaluated and it was felt that evaluation by orthopedics was indicated.  We are seeing her today for this.  HPI  Victoria Snyder is a 53 year old African-American female who presents today with a history of low back pain, right groin pain, right knee pain and right leg pain.  Initially had a fall back in January when she was walking and she does have right-sided chronic low back pain.  She felt a catch in her right hip and with a twisting motion of her right knee she fell.  At that time she had heard a pop in her right knee.  She was seen by primary care where she was placed on prednisone 10-day 20mg  dosage.  She states the swelling did improve in her right knee.  Because of her continued low back pain  she was placed on a muscle relaxant and was going to continue gabapentin.  Review of Systems  Constitutional: Negative for fatigue.  HENT: Negative for ear pain.   Eyes: Negative for pain.  Respiratory: Negative for shortness of breath.   Cardiovascular: Negative for leg swelling.  Gastrointestinal: Positive for constipation. Negative for diarrhea.  Endocrine: Negative for cold intolerance and heat intolerance.  Genitourinary: Negative for difficulty urinating.  Musculoskeletal: Positive for joint swelling.  Skin: Negative for rash.  Allergic/Immunologic: Negative for food allergies.  Neurological: Negative for weakness.  Hematological: Does not bruise/bleed easily.  Psychiatric/Behavioral: Positive for sleep disturbance.     Objective: Vital Signs: BP (!) 130/103   Pulse 88   Ht  5\' 9"  (1.753 m)   Wt 169 lb (76.7 kg)   BMI 24.96 kg/m   Physical Exam Constitutional:      Appearance: She is well-developed.  Eyes:     Pupils: Pupils are equal, round, and reactive to light.  Pulmonary:     Effort: Pulmonary effort is normal.  Skin:    General: Skin is warm and dry.  Neurological:     Mental Status: She is alert and oriented to person, place, and time.  Psychiatric:        Behavior: Behavior normal.     Ortho Exam  Lumbosacral spine reveals some diffuse tenderness to palpation over the lower lumbar spine.  She has some tenderness to palpation over the SI joints bilaterally.  Forward flexion she can get to about 6 inches of fingertips away from the floor.  Backward extension does cause her pain and is limited to about 10 degrees.  Twisting does cause her pain also.  Bilateral straight leg raises are negative.  She states decreased sensation in the right calf in all 4 planes in comparison to the left. Excellent strength in the lower extremities bilateral and symmetric.  Dorsalis pedis pulse 1-2+.  She does have pain with internal or external rotation of the hip on the right in comparison to the left.  Slight decreased range of motion on the right in comparison to the left also.  Right knee reveals range of motion 0 to 110 degrees.  She is tender to palpation over the medial joint line.  McMurray's does not particular give her much in the way of pain.  She appears to be ligamentously stable.  Calf is supple nontender.  Neurovascular intact distally.  Specialty Comments:  No specialty comments available.  Imaging: Xr Hip Unilat W Or W/o Pelvis 2-3 Views Right  Result Date: 08/13/2018 2 view x-ray of the hip and pelvis on the right reveals marked cystic changes in both femoral heads.  Some.joint space narrowing.  Also cam lesions on both femoral heads.  SI joints do appear to have some noted also.    PMFS History: Current Outpatient Medications  Medication Sig  Dispense Refill  . amLODipine (NORVASC) 5 MG tablet Take 1 tablet (5 mg total) by mouth daily. To lower blood pressure 30 tablet 3  . Ascorbic Acid (VITAMIN C) 1000 MG tablet Take 1,000 mg by mouth daily.    . busPIRone (BUSPAR) 15 MG tablet Take 1 tablet (15 mg total) by mouth 3 (three) times daily. 90 tablet 0  . cetirizine (ZYRTEC) 10 MG tablet Take 1 tablet (10 mg total) by mouth daily. 30 tablet 1  . diclofenac sodium (VOLTAREN) 1 % GEL Apply 4 g topically 4 (four) times daily. To help with knee pain 1 Tube 5  .  escitalopram (LEXAPRO) 5 MG tablet Take 1 tablet (5 mg total) by mouth daily. 30 tablet 3  . fluticasone (FLONASE) 50 MCG/ACT nasal spray Place 1 spray into both nostrils daily. 16 g 2  . gabapentin (NEURONTIN) 300 MG capsule Take 1 capsule (300 mg total) by mouth 3 (three) times daily. To help with back pain with radiation 90 capsule 5  . hydrOXYzine (ATARAX/VISTARIL) 10 MG tablet Take 1-3 tablets (10-30 mg total) by mouth 3 (three) times daily as needed. (Patient taking differently: Take 10-30 mg by mouth 3 (three) times daily as needed for anxiety. ) 90 tablet 0  . ibuprofen (ADVIL,MOTRIN) 600 MG tablet Take 1 tablet (600 mg total) by mouth every 8 (eight) hours as needed for moderate pain. Eat before taking medication 60 tablet 1  . meclizine (ANTIVERT) 25 MG tablet Take 1 tablet (25 mg total) by mouth 3 (three) times daily as needed for dizziness. 30 tablet 3  . Multiple Vitamins-Minerals (MULTIVITAMIN WITH MINERALS) tablet Take 1 tablet by mouth daily.    Marland Kitchen tiZANidine (ZANAFLEX) 4 MG tablet Take 1 tablet (4 mg total) by mouth every 8 (eight) hours as needed for muscle spasms. 60 tablet 1   No current facility-administered medications for this visit.     Patient Active Problem List   Diagnosis Date Noted  . Hypertension 03/31/2018  . Low back pain with radiation 03/31/2018  . History of vertigo 03/31/2018  . Anxiety and depression 03/31/2018  . Bipolar disorder ([place])  03/31/2018   Past Medical History:  Diagnosis Date  . Bipolar disorder ([place])   . Blood transfusion without reported diagnosis   . Hypertension   . Hypokalemia   . Low back pain with radiation   . Vertigo     Family History  Problem Relation Age of Onset  . Heart disease Mother        heart attack  . Stroke Father     Past Surgical History:  Procedure Laterality Date  . ABDOMINAL HYSTERECTOMY     Social History   Occupational History  . Not on file  Tobacco Use  . Smoking status: Former Research scientist (life sciences)  . Smokeless tobacco: Never Used  Substance and Sexual Activity  . Alcohol use: Not Currently    Alcohol/week: 1.0 - 2.0 standard drinks    Types: 1 - 2 Standard drinks or equivalent per week  . Drug use: No  . Sexual activity: Yes

## 2018-08-25 ENCOUNTER — Telehealth: Payer: Self-pay | Admitting: *Deleted

## 2018-08-25 NOTE — Telephone Encounter (Signed)
Patient verified DOB Patient was seen by ortho and was recommended to have an MRI completed to address the spurs that were noted.  Patient states the gabapentin is helping and she will follow up with PCP one MRI can be completed. Patient rescheduled for 4/27 at 9:30 am instead of a telephone visit.

## 2018-08-26 ENCOUNTER — Ambulatory Visit: Payer: Medicaid Other | Admitting: Family Medicine

## 2018-09-17 ENCOUNTER — Other Ambulatory Visit (INDEPENDENT_AMBULATORY_CARE_PROVIDER_SITE_OTHER): Payer: Self-pay | Admitting: Orthopedic Surgery

## 2018-09-21 ENCOUNTER — Other Ambulatory Visit: Payer: Self-pay

## 2018-09-21 ENCOUNTER — Ambulatory Visit (HOSPITAL_BASED_OUTPATIENT_CLINIC_OR_DEPARTMENT_OTHER): Payer: Medicaid Other | Admitting: Family Medicine

## 2018-09-21 ENCOUNTER — Encounter: Payer: Self-pay | Admitting: Family Medicine

## 2018-09-21 DIAGNOSIS — M5440 Lumbago with sciatica, unspecified side: Secondary | ICD-10-CM

## 2018-09-21 DIAGNOSIS — M25551 Pain in right hip: Secondary | ICD-10-CM

## 2018-09-21 DIAGNOSIS — M1611 Unilateral primary osteoarthritis, right hip: Secondary | ICD-10-CM

## 2018-09-21 DIAGNOSIS — G8929 Other chronic pain: Secondary | ICD-10-CM

## 2018-09-21 DIAGNOSIS — M5136 Other intervertebral disc degeneration, lumbar region: Secondary | ICD-10-CM

## 2018-09-21 DIAGNOSIS — M25561 Pain in right knee: Secondary | ICD-10-CM

## 2018-09-21 MED ORDER — TRAMADOL HCL 50 MG PO TABS: 50.0000 mg | ORAL_TABLET | Freq: Two times a day (BID) | ORAL | 1 refills | Status: AC | PRN

## 2018-09-21 NOTE — Progress Notes (Signed)
Knee pain and back pain for about 4-5 months. Per pt she fell about 2 months ago. Per pt she lost her balance in her hip and [facility] did xray on her knee and she have MRI scheduled for her back  Hip hurts so back that pain radiates to both legs.

## 2018-09-21 NOTE — Progress Notes (Signed)
Virtual Visit via Telephone Note  I connected with Victoria Snyder on 09/21/18 at  9:30 AM EDT by telephone and verified that I am speaking with the correct person using two identifiers.   I discussed the limitations, risks, security and privacy concerns of performing an evaluation and management service by telephone and the availability of in person appointments. I also discussed with the patient that there may be a patient responsible charge related to this service. The patient expressed understanding and agreed to proceed.  Patient Location: Home Provider Location: Office Others participating in call: Emilio Aspen, CMA   History of Present Illness:      53 year old female with complaint of pain in her lower back and right hip.  Patient states that she fell about 2 months ago when she had a catch in her right hip which caused her to fall forward.  Patient states that she has sharp pain which radiates from her right lower back down her right hip and right leg.  Patient also gets back pain and stiffness.  Patient states that her pain ranges from 8-10 on a 0-10 pain scale.  Patient states that the sensation of the catching in her hip has started over the last 2 months.  She states that she did see orthopedics and was diagnosed with arthritis in her right hip and right knee.  Patient states that she was told that she she also has an issue with her back and will be scheduled for an MRI but because of COVID-19, her MRI is not scheduled until June.  Patient states that she continues to take gabapentin 3 times daily but this causes her to feel sleepy.  She is also taking ibuprofen 600 mg as needed for pain but this is not giving her adequate pain relief.   Past Medical History:  Diagnosis Date  . Bipolar disorder ([place])   . Blood transfusion without reported diagnosis   . Hypertension   . Hypokalemia   . Low back pain with radiation   . Vertigo     Past Surgical History:  Procedure Laterality  Date  . ABDOMINAL HYSTERECTOMY      Family History  Problem Relation Age of Onset  . Heart disease Mother        heart attack  . Stroke Father     Social History   Tobacco Use  . Smoking status: Former Research scientist (life sciences)  . Smokeless tobacco: Never Used  Substance Use Topics  . Alcohol use: Not Currently    Alcohol/week: 1.0 - 2.0 standard drinks    Types: 1 - 2 Standard drinks or equivalent per week  . Drug use: No     No Known Allergies   Review of Systems  Constitutional: Positive for malaise/fatigue. Negative for chills and fever.  Respiratory: Negative for cough and shortness of breath.   Cardiovascular: Negative for chest pain and palpitations.  Gastrointestinal: Negative for abdominal pain, nausea and vomiting.  Genitourinary: Negative for dysuria and frequency.  Musculoskeletal: Positive for back pain, falls, joint pain and myalgias.  Neurological: Negative for dizziness and headaches.    Observations/Objective: No vital signs or physical exam conducted as visit was done via telephone  Assessment and Plan: 1. Low back pain with radiation Patient with lumbar radiculopathy and lumbar degenerative disc disease for which she has been seen by orthopedics on 08/13/2018.  Due to the COVID-19 pandemic, patient's MRI of her liver that is not scheduled until June.  Patient can continue gabapentin as needed and  patient reminded that she also has tizanidine to take as needed for muscle spasm.  Prescription sent in for tramadol 50 mg twice daily as needed for more severe pain. - traMADol (ULTRAM) 50 MG tablet; Take 1 tablet (50 mg total) by mouth every 12 (twelve) hours as needed for up to 5 days for moderate pain.  Dispense: 60 tablet; Refill: 1  2. Right hip pain Per orthopedic notes, patient with osteoarthritis of the right hip and plan is for cortisone intra-articular injection in approximately 4 weeks to help with pain.  Injection is being delayed due to COVID virus.  Prescription  provided for tramadol to take as needed for joint pain not controlled by ibuprofen - traMADol (ULTRAM) 50 MG tablet; Take 1 tablet (50 mg total) by mouth every 12 (twelve) hours as needed for up to 5 days for moderate pain.  Dispense: 60 tablet; Refill: 1  3. Chronic pain of right knee Patient has ibuprofen as well as Voltaren gel and she is encouraged to use the Voltaren gel up to 4 times daily for her right knee pain but also provided with prescription for tramadol to help with her low back and knee pain as well - traMADol (ULTRAM) 50 MG tablet; Take 1 tablet (50 mg total) by mouth every 12 (twelve) hours as needed for up to 5 days for moderate pain.  Dispense: 60 tablet; Refill: 1  4. Lumbar degenerative disc disease Patient per orthopedic note is scheduled for June MRI and she will continue her current medications for treatment of chronic low back pain with new addition of tramadol  5. Primary osteoarthritis of right hip Patient is scheduled for hip injection per orthopedics but unfortunately this will not take place for at least 3 weeks due to concerns regarding COVID-19  Follow Up Instructions:Return if symptoms worsen or fail to improve, for follow-up with Orthopedics if worsening of pain.    I discussed the assessment and treatment plan with the patient. The patient was provided an opportunity to ask questions and all were answered. The patient agreed with the plan and demonstrated an understanding of the instructions.   The patient was advised to call back or seek an in-person evaluation if the symptoms worsen or if the condition fails to improve as anticipated.  I provided 11 minutes of non-face-to-face time during this encounter.   Antony Blackbird, MD

## 2018-10-26 ENCOUNTER — Ambulatory Visit
Admission: RE | Admit: 2018-10-26 | Discharge: 2018-10-26 | Disposition: A | Discharge status: Home / Self Care | Payer: Self-pay | Source: Ambulatory Visit | Attending: Orthopedic Surgery | Admitting: Orthopedic Surgery

## 2018-10-26 ENCOUNTER — Other Ambulatory Visit: Payer: Self-pay

## 2018-10-26 DIAGNOSIS — M5136 Other intervertebral disc degeneration, lumbar region: Secondary | ICD-10-CM

## 2018-10-26 DIAGNOSIS — M51369 Other intervertebral disc degeneration, lumbar region without mention of lumbar back pain or lower extremity pain: Secondary | ICD-10-CM

## 2018-11-19 MED FILL — busPIRone HCL 15 MG TABS: 15 | 30 days supply | Qty: 60 | Fill #0

## 2018-11-19 MED FILL — QUETIAPINE FUMARATE 50 MG T: 50 | 30 days supply | Qty: 30 | Fill #0

## 2018-11-19 MED FILL — hydrOXYzine HCL 25 MG TABS: 25 | 30 days supply | Qty: 90 | Fill #0

## 2018-11-19 MED FILL — ESCITALOPRAM 10 MG TABLET: 10 | 30 days supply | Qty: 45 | Fill #0

## 2018-11-19 MED FILL — traMADol HCL 50 MG TABS: 50 | 30 days supply | Qty: 60 | Fill #0

## 2018-12-04 ENCOUNTER — Telehealth: Payer: Self-pay | Admitting: Family Medicine

## 2018-12-04 NOTE — Telephone Encounter (Signed)
Spoke with patient and informed her that results have not been sent to staff and message will be sent to provider. Patient agreed and verbalized understanding.

## 2018-12-04 NOTE — Telephone Encounter (Signed)
Patient called requesting her results of her  MRI Lumbar Spine . Thank you

## 2018-12-04 NOTE — Telephone Encounter (Signed)
Please asked patient to contact her orthopedic doctor regarding her MRI results.  MRI was ordered by orthopedics and per chart, she also needed to schedule follow-up appointment with orthopedics

## 2018-12-04 NOTE — Telephone Encounter (Signed)
Spoke with patient and informed her with what provider stated and patient verbalized understanding.  

## 2018-12-09 ENCOUNTER — Other Ambulatory Visit: Payer: Self-pay

## 2018-12-09 DIAGNOSIS — Z20822 Contact with and (suspected) exposure to covid-19: Secondary | ICD-10-CM

## 2018-12-13 LAB — NOVEL CORONAVIRUS, NAA: SARS-CoV-2, NAA: NOT DETECTED

## 2018-12-16 ENCOUNTER — Telehealth: Payer: Self-pay | Admitting: Orthopedic Surgery

## 2018-12-16 NOTE — Telephone Encounter (Signed)
Please advise. Schedule apptt?

## 2018-12-16 NOTE — Telephone Encounter (Signed)
[name] states that Victoria Snyder ordered an MRI for her back at the beginning of June.  She has not heard from anyone regarding the results.  Please call to discuss.

## 2018-12-16 NOTE — Telephone Encounter (Signed)
Yes make an appt

## 2018-12-17 ENCOUNTER — Other Ambulatory Visit: Payer: Self-pay

## 2018-12-17 ENCOUNTER — Encounter: Payer: Self-pay | Admitting: Orthopedic Surgery

## 2018-12-17 ENCOUNTER — Ambulatory Visit (INDEPENDENT_AMBULATORY_CARE_PROVIDER_SITE_OTHER): Payer: Self-pay | Admitting: Orthopedic Surgery

## 2018-12-17 VITALS — BP 133/92 | HR 92 | Resp 14 | Ht 69.0 in | Wt 169.0 lb

## 2018-12-17 DIAGNOSIS — M5440 Lumbago with sciatica, unspecified side: Secondary | ICD-10-CM

## 2018-12-17 DIAGNOSIS — M5136 Other intervertebral disc degeneration, lumbar region: Secondary | ICD-10-CM

## 2018-12-17 DIAGNOSIS — M51369 Other intervertebral disc degeneration, lumbar region without mention of lumbar back pain or lower extremity pain: Secondary | ICD-10-CM

## 2018-12-17 DIAGNOSIS — M545 Low back pain, unspecified: Secondary | ICD-10-CM

## 2018-12-17 DIAGNOSIS — M25552 Pain in left hip: Secondary | ICD-10-CM

## 2018-12-17 DIAGNOSIS — M48061 Spinal stenosis, lumbar region without neurogenic claudication: Secondary | ICD-10-CM

## 2018-12-17 MED FILL — QUETIAPINE FUMARATE 50 MG T: 50 | 30 days supply | Qty: 30 | Fill #0

## 2018-12-17 MED FILL — hydrOXYzine HCL 25 MG TABS: 25 | 30 days supply | Qty: 90 | Fill #0

## 2018-12-17 MED FILL — ESCITALOPRAM 10 MG TABLET: 10 | 30 days supply | Qty: 45 | Fill #0

## 2018-12-17 MED FILL — busPIRone HCL 15 MG TABS: 15 | 30 days supply | Qty: 60 | Fill #0

## 2018-12-17 NOTE — Progress Notes (Signed)
Office Visit Note   Patient: Victoria Snyder           Date of Birth: 05-04-1966           MRN: [id number] Visit Date: 12/17/2018              Requested by: Antony Blackbird, MD [place],  [place] [address] PCP: Antony Blackbird, MD   Assessment & Plan: Visit Diagnoses:  1. Lumbar pain   2. Pain in left hip   3. Low back pain with radiation   4. DDD (degenerative disc disease), lumbar   5. Spinal stenosis of lumbar region, unspecified whether neurogenic claudication present      Since previous MRI of 2010, progressive annular disc bulging, facet and ligamentous hypertrophy at L3-4, contributing to mild spinal stenosis and mild narrowing of the lateral recesses and foramina bilaterally. Similar findings at L4-5 with annular disc bulging and a left paracentral disc protrusion contributing to mild spinal stenosis and mild narrowing of the lateral recesses left greater than right. There is possible chronic left L5 nerve root encroachment. Mildly progressive chronic degenerative disc disease at L5-S1 with possible extraforaminal L5 nerve root encroachment by osteophytes.   Plan:  #1: We will have her see Dr. Ernestina Patches for evaluation for epidural injections as well as possible intra-articular corticosteroid injection to the hip. #2: Follow back up as needed   Follow-Up Instructions: Return if symptoms worsen or fail to improve.   Orders:  Orders Placed This Encounter  Procedures   Ambulatory referral to Physical Medicine Rehab   Ambulatory referral to Physical Therapy   No orders of the defined types were placed in this encounter.     Procedures: No procedures performed   Clinical Data: No additional findings.   Subjective: No chief complaint on file.   HPI  Victoria Snyder is a 53 year old African-American female who presents today with a history of low back pain, right groin pain, right knee pain and right leg pain.  Initially had a fall back in January when  she was walking and she does have right-sided chronic low back pain.  She felt a catch in her right hip and with a twisting motion of her right knee she fell.  At that time she had heard a pop in her right knee.  She was seen by primary care where she was placed on prednisone 10-day 20mg  dosage.  She states the swelling did improve in her right knee.  She had an MRI scheduled and completed and returns today for review of that.  Review of Systems  Constitutional: Negative for fatigue.  HENT: Negative for ear pain.   Eyes: Negative for pain.  Respiratory: Negative for shortness of breath.   Cardiovascular: Negative for leg swelling.  Gastrointestinal: Positive for constipation. Negative for diarrhea.  Endocrine: Negative for cold intolerance and heat intolerance.  Genitourinary: Negative for difficulty urinating.  Musculoskeletal: Positive for joint swelling.  Skin: Negative for rash.  Allergic/Immunologic: Negative for food allergies.  Neurological: Negative for weakness.  Hematological: Does not bruise/bleed easily.  Psychiatric/Behavioral: Positive for sleep disturbance.     Objective: Vital Signs: BP (!) 133/92 (BP Location: Right Arm, Patient Position: Sitting, Cuff Size: Normal)    Pulse 92   Physical Exam Constitutional:      Appearance: She is well-developed.  Eyes:     Pupils: Pupils are equal, round, and reactive to light.  Pulmonary:     Effort: Pulmonary effort is normal.  Skin:  General: Skin is warm and dry.  Neurological:     Mental Status: She is alert and oriented to person, place, and time.  Psychiatric:        Behavior: Behavior normal.     Ortho Exam  Exam today continues to have tenderness in the SI joints as well as lower lumbar spine.  She has difficulty with sitting at this time.  She does have pain with motion of the lumbar spine.  Straight leg raising is negative.  Calf supple nontender.  Neuro vas intact distally.  Specialty Comments:  No  specialty comments available.  Imaging: CLINICAL DATA:  Progressive chronic low back pain extending into both hips and legs for more than 6 years. No acute injury or prior relevant surgery.  EXAM: MRI LUMBAR SPINE WITHOUT CONTRAST  TECHNIQUE: Multiplanar, multisequence MR imaging of the lumbar spine was performed. No intravenous contrast was administered.  COMPARISON:  Lumbar MRI 05/10/2009.  Radiographs 07/31/2018.  FINDINGS: Segmentation: Conventional anatomy assumed, with the last open disc space designated L5-S1.Concordant with previous imaging.  Alignment:  Normal.  Vertebrae: No worrisome osseous lesion, acute fracture or pars defect. There are progressive endplate degenerative changes at L5-S1 and progressive facet arthropathy, especially at L3-4.  Conus medullaris: Extends to the T12-L1 level and appears normal.  Paraspinal and other soft tissues: No significant paraspinal findings.  Disc levels:  No significant disc space findings from T11-12 through L1-2.  L2-3: Mild disc bulging with mild facet and ligamentous hypertrophy. No significant spinal stenosis or nerve root encroachment.  L3-4: Mildly progressive annular disc bulging. Moderately progressive facet and ligamentous hypertrophy with associated periarticular soft tissue edema. These findings contribute to mild spinal stenosis and mild narrowing of the lateral recesses and foramina bilaterally.  L4-5: Similar annular disc bulging and focal left paracentral disc protrusion. Mild facet and ligamentous hypertrophy. There is resulting mild spinal stenosis with mild narrowing of the lateral recesses, left greater than right. There is possible encroachment on the left L5 nerve root in the canal. The foramina remain sufficiently patent.  L5-S1: Chronic degenerative disc disease with annular disc bulging and endplate osteophytes laterally. Mild facet and ligamentous hypertrophy. The foramina  and lateral recesses are patent. Osteophytes may contribute to chronic extraforaminal L5 nerve root encroachment.  IMPRESSION: 1. Since previous MRI of 2010, progressive annular disc bulging, facet and ligamentous hypertrophy at L3-4, contributing to mild spinal stenosis and mild narrowing of the lateral recesses and foramina bilaterally. 2. Similar findings at L4-5 with annular disc bulging and a left paracentral disc protrusion contributing to mild spinal stenosis and mild narrowing of the lateral recesses left greater than right. There is possible chronic left L5 nerve root encroachment. 3. Mildly progressive chronic degenerative disc disease at L5-S1 with possible extraforaminal L5 nerve root encroachment by osteophytes.   PMFS History: Current Outpatient Medications  Medication Sig Dispense Refill   amLODipine (NORVASC) 5 MG tablet Take 1 tablet (5 mg total) by mouth daily. To lower blood pressure 30 tablet 3   Ascorbic Acid (VITAMIN C) 1000 MG tablet Take 1,000 mg by mouth daily.     busPIRone (BUSPAR) 15 MG tablet Take 1 tablet (15 mg total) by mouth 3 (three) times daily. 90 tablet 0   cetirizine (ZYRTEC) 10 MG tablet Take 1 tablet (10 mg total) by mouth daily. 30 tablet 1   diclofenac sodium (VOLTAREN) 1 % GEL Apply 4 g topically 4 (four) times daily. To help with knee pain 1 Tube 5   escitalopram (  LEXAPRO) 5 MG tablet Take 1 tablet (5 mg total) by mouth daily. 30 tablet 3   fluticasone (FLONASE) 50 MCG/ACT nasal spray Place 1 spray into both nostrils daily. 16 g 2   gabapentin (NEURONTIN) 300 MG capsule Take 1 capsule (300 mg total) by mouth 3 (three) times daily. To help with back pain with radiation 90 capsule 5   hydrOXYzine (ATARAX/VISTARIL) 10 MG tablet Take 1-3 tablets (10-30 mg total) by mouth 3 (three) times daily as needed. (Patient taking differently: Take 10-30 mg by mouth 3 (three) times daily as needed for anxiety. ) 90 tablet 0   ibuprofen  (ADVIL,MOTRIN) 600 MG tablet Take 1 tablet (600 mg total) by mouth every 8 (eight) hours as needed for moderate pain. Eat before taking medication 60 tablet 1   meclizine (ANTIVERT) 25 MG tablet Take 1 tablet (25 mg total) by mouth 3 (three) times daily as needed for dizziness. 30 tablet 3   Multiple Vitamins-Minerals (MULTIVITAMIN WITH MINERALS) tablet Take 1 tablet by mouth daily.     tiZANidine (ZANAFLEX) 4 MG tablet Take 1 tablet (4 mg total) by mouth every 8 (eight) hours as needed for muscle spasms. 60 tablet 1   No current facility-administered medications for this visit.     Patient Active Problem List   Diagnosis Date Noted   DDD (degenerative disc disease), lumbar 12/17/2018   Spinal stenosis of lumbar region 12/17/2018   Hypertension 03/31/2018   Low back pain with radiation 03/31/2018   History of vertigo 03/31/2018   Anxiety and depression 03/31/2018   Bipolar disorder ([place]) 03/31/2018   Past Medical History:  Diagnosis Date   Bipolar disorder ([place])    Blood transfusion without reported diagnosis    Hypertension    Hypokalemia    Low back pain with radiation    Vertigo     Family History  Problem Relation Age of Onset   Heart disease Mother        heart attack   Stroke Father     Past Surgical History:  Procedure Laterality Date   ABDOMINAL HYSTERECTOMY     Social History   Occupational History   Not on file  Tobacco Use   Smoking status: Former Smoker   Smokeless tobacco: Never Used  Substance and Sexual Activity   Alcohol use: Not Currently    Alcohol/week: 1.0 - 2.0 standard drinks    Types: 1 - 2 Standard drinks or equivalent per week   Drug use: No   Sexual activity: Yes

## 2018-12-17 NOTE — Telephone Encounter (Signed)
12/17/2018 at 12:00 w/Brian

## 2018-12-18 ENCOUNTER — Telehealth: Payer: Self-pay | Admitting: Family Medicine

## 2018-12-18 NOTE — Telephone Encounter (Signed)
Patient is calling back for her negative COVID results. Patient expressed understanding. Thank you

## 2018-12-28 ENCOUNTER — Encounter: Payer: Self-pay | Admitting: Physical Therapy

## 2018-12-28 ENCOUNTER — Ambulatory Visit: Payer: Self-pay | Attending: Orthopedic Surgery | Admitting: Physical Therapy

## 2018-12-28 ENCOUNTER — Other Ambulatory Visit: Payer: Self-pay

## 2018-12-28 DIAGNOSIS — M545 Low back pain, unspecified: Secondary | ICD-10-CM

## 2018-12-28 DIAGNOSIS — M25551 Pain in right hip: Secondary | ICD-10-CM | POA: Insufficient documentation

## 2018-12-28 DIAGNOSIS — R2689 Other abnormalities of gait and mobility: Secondary | ICD-10-CM | POA: Insufficient documentation

## 2018-12-28 DIAGNOSIS — M25552 Pain in left hip: Secondary | ICD-10-CM | POA: Insufficient documentation

## 2018-12-28 DIAGNOSIS — G8929 Other chronic pain: Secondary | ICD-10-CM | POA: Insufficient documentation

## 2018-12-28 NOTE — Therapy (Signed)
[place], [place], [address] Phone: [phone]   Fax:  [phone]  Physical Therapy Evaluation  Patient Details  Name: Victoria Snyder MRN: [id number] Date of Birth: February 26, 1966 Referring Provider (PT): Jonell Cluck PA    Encounter Date: 12/28/2018  PT End of Session - 12/28/18 1302    Visit Number  1    Number of Visits  6    Date for PT Re-Evaluation  02/08/19    Authorization Type  Self Pay    PT Start Time  6962    PT Stop Time  1227    PT Time Calculation (min)  42 min    Activity Tolerance  Patient tolerated treatment well    Behavior During Therapy  [facility] for tasks assessed/performed       Past Medical History:  Diagnosis Date  . Bipolar disorder ([place])   . Blood transfusion without reported diagnosis   . Hypertension   . Hypokalemia   . Low back pain with radiation   . Vertigo     Past Surgical History:  Procedure Laterality Date  . ABDOMINAL HYSTERECTOMY      There were no vitals filed for this visit.   Subjective Assessment - 12/28/18 1150    Subjective  Patient has been having lower back pain for several years. The pain is on the right side of her lower back and her right hip. When it gets bad it raidates down into her left leg.    Pertinent History  bilateral low back pain, vertigo , bi-polar disorder    How long can you stand comfortably?  can stand for 30-40 minutes    How long can you walk comfortably?  limiteddistances    Patient Stated Goals  Multi level disc buldging and degernation; potential L5 nerve root compression    Currently in Pain?  Yes    Pain Score  6     Pain Location  Back    Pain Orientation  Right    Pain Descriptors / Indicators  Aching    Pain Type  Chronic pain    Pain Radiating Towards  inot the left leg    Pain Onset  More than a month ago    Pain Frequency  Constant    Aggravating Factors   Moving increases the pain, pain at night,    Pain  Relieving Factors  not moving    Effect of Pain on Daily Activities  difficulty standing and walking; difficulty perfroming daily activity         [facility] PT Assessment - 12/28/18 0001      Assessment   Medical Diagnosis  Low Back Pain Bilateral hip pain     Referring Provider (PT)  Jonell Cluck PA     Onset Date/Surgical Date  --   Has been having low back pain since 2010   Hand Dominance  Right    Prior Therapy  None       Precautions   Precautions  None      Restrictions   Weight Bearing Restrictions  No      Balance Screen   Has the patient fallen in the past 6 months  No    How many times?  --    Has the patient had a decrease in activity level because of a fear of falling?   No    Is the patient reluctant to leave their home because of a fear of falling?  No      Home Environment   Additional Comments  A few steps into the house       Prior Function   Level of Independence  Independent    Vocation  Unemployed    Leisure  Plznt flowers, work out       Charity fundraiser Status  Within Functional Limits for tasks assessed    Attention  Focused    Focused Attention  Appears intact    Memory  Appears intact    Awareness  Appears intact    Problem Solving  Appears intact    Executive Function  Reasoning      Observation/Other Assessments   Observations  left hip PSIS  elevation in standing left ASIS lower then right.     Skin Integrity  '    Focus on Therapeutic Outcomes (FOTO)   not given 2nd to potential mediciad       Sensation   Light Touch  Appears Intact    Additional Comments  Pain radiaitng into both hips. Denies parathesias       Coordination   Gross Motor Movements are Fluid and Coordinated  Yes    Fine Motor Movements are Fluid and Coordinated  Yes      Posture/Postural Control   Posture Comments  walks with flexed trunk       ROM / Strength   AROM / PROM / Strength  AROM;PROM;Strength      AROM   AROM Assessment Site  Lumbar     Lumbar Flexion  40    Lumbar Extension  18      PROM   Overall PROM Comments  pain wiht 90 degrees of hip flexion right and passive hip IR. Pain with passvie left hip flexion to 80 and pain with internal rotation.     PROM Assessment Site  Hip      Strength   Strength Assessment Site  Hip;Knee    Right/Left Hip  Right;Left    Right Hip Flexion  3+/5    Right Hip ABduction  4/5    Right Hip ADduction  4/5    Left Hip Flexion  3/5    Left Hip ABduction  3+/5    Left Hip ADduction  3+/5    Right/Left Knee  Right;Left    Right Knee Flexion  4+/5    Right Knee Extension  4+/5    Left Knee Flexion  4/5    Left Knee Extension  4/5      Palpation   SI assessment   potential left hip anterior hip mobilization       Bed Mobility   Bed Mobility  --   min a for supine to sit      Ambulation/Gait   Gait Comments  decreased hip rotation, left hip elevated compared to right; decreased streide length, significant lateral movement.                 Objective measurements completed on examination: See above findings.      [facility] Adult PT Treatment/Exercise - 12/28/18 0001      Exercises   Exercises  Knee/Hip;Lumbar      Lumbar Exercises: Stretches   Lower Trunk Rotation Limitations  x10    Piriformis Stretch Limitations  only able to get herself to the position       Knee/Hip Exercises: Supine   Other Supine Knee/Hip Exercises  ppt x10 with mod cuing for breathing  PT Education - 12/28/18 1300    Education Details  HEP, symptom management,    Person(s) Educated  Patient    Methods  Explanation;Demonstration;Tactile cues;Verbal cues    Comprehension  Verbalized understanding;Returned demonstration;Verbal cues required;Tactile cues required       PT Short Term Goals - 12/28/18 1316      PT SHORT TERM GOAL #1   Title  Patient wil be independent with basic HEP    Time  3    Period  Weeks    Status  New    Target Date  01/18/19      PT SHORT TERM  GOAL #2   Title  Patient will increase lumbar spine flexion by 20 degrees.    Time  3    Period  Weeks    Status  New    Target Date  01/18/19      PT SHORT TERM GOAL #3   Title  Patient will increase gross bilateral LE strength to 4/5    Time  3    Period  Weeks    Status  New    Target Date  01/18/19        PT Long Term Goals - 12/28/18 1317      PT LONG TERM GOAL #1   Title  Patient will stand for 15 minutes withotu slef report of increased pain    Time  6    Period  Weeks    Status  New    Target Date  02/08/19      PT LONG TERM GOAL #2   Title  Patient will increase bilateral active hip motion to full    Time  6    Period  Weeks    Status  New    Target Date  02/08/19             Plan - 12/28/18 1303    Clinical Impression Statement  Patient is a 53 year old female with low back pain that raidaes into her hip R> L. She has significant limitations in hip and lumbar mobility. She has weakness in both hips L>R. She also reports intermittent swelling in her right knee from a fall last year. She has limited hip rotation with gait and significant lateral movement. She has trigger points in her lumbar paraspinals and gluteals. She would benefit from skilled therapy to improve hip mobility and strength and core mobility and strength. She is self pay which  will likely limit the amount of visits she can come to. She is Medicaid pending.    Personal Factors and Comorbidities  Comorbidity 1;Comorbidity 2    Comorbidities  bi-polar, long standing low back pain    Examination-Activity Limitations  Locomotion Level;Squat;Stairs;Stand;Lift;Sit    Examination-Participation Restrictions  Driving;Community Activity;Cleaning    Stability/Clinical Decision Making  Evolving/Moderate complexity    Clinical Decision Making  Moderate    Rehab Potential  Fair   long standing back pain / Private pay   PT Frequency  1x / week    PT Duration  6 weeks    PT Treatment/Interventions   ADLs/Self Care Home Management;Electrical Stimulation;Cryotherapy;Iontophoresis 4mg /ml Dexamethasone;Moist Heat;Traction;DME Instruction;Functional mobility training;Therapeutic activities;Therapeutic exercise;Neuromuscular re-education;Patient/family education;Manual techniques;Passive range of motion;Taping;Dry needling    PT Next Visit Plan  if she gets approved for Mediciad we may be able to have more visits with her. Otherwise add supine march, seated/supine ball squeeze, seated hamstring stretch; consider LAD, soft tissue mobility to lumbar spine. Review use of the  tens.    PT Home Exercise Plan  ppt wih abdominal breathing, lower trunk rotation; pirifromis stretch,    Consulted and Agree with Plan of Care  Patient       Patient will benefit from skilled therapeutic intervention in order to improve the following deficits and impairments:  Abnormal gait, Increased fascial restricitons, Difficulty walking, Decreased range of motion, Decreased mobility, Decreased strength, Improper body mechanics, Decreased activity tolerance, Impaired UE functional use, Decreased safety awareness  Visit Diagnosis: 1. Chronic bilateral low back pain without sciatica   2. Pain in left hip   3. Pain in right hip   4. Other abnormalities of gait and mobility        Problem List Patient Active Problem List   Diagnosis Date Noted  . DDD (degenerative disc disease), lumbar 12/17/2018  . Spinal stenosis of lumbar region 12/17/2018  . Hypertension 03/31/2018  . Low back pain with radiation 03/31/2018  . History of vertigo 03/31/2018  . Anxiety and depression 03/31/2018  . Bipolar disorder ([place]) 03/31/2018    Carney Living PT DPT  12/28/2018, 1:26 PM  [facility] [address] [place], [place], [address] Phone: [phone]   Fax:  [phone]  Name: Victoria Snyder MRN: [id number] Date of Birth: 1965/08/05

## 2018-12-30 ENCOUNTER — Encounter: Payer: Self-pay | Admitting: Physical Medicine and Rehabilitation

## 2018-12-30 ENCOUNTER — Ambulatory Visit (INDEPENDENT_AMBULATORY_CARE_PROVIDER_SITE_OTHER): Payer: Self-pay | Admitting: Physical Medicine and Rehabilitation

## 2018-12-30 VITALS — BP 153/100 | HR 74 | Ht 69.0 in | Wt 150.0 lb

## 2018-12-30 DIAGNOSIS — M5416 Radiculopathy, lumbar region: Secondary | ICD-10-CM

## 2018-12-30 DIAGNOSIS — F411 Generalized anxiety disorder: Secondary | ICD-10-CM

## 2018-12-30 MED FILL — traMADol HCL 50 MG TABS: 50 | 5 days supply | Qty: 10 | Fill #0

## 2018-12-30 NOTE — Progress Notes (Signed)
 .  Numeric Pain Rating Scale and Functional Assessment Average Pain 9 Pain Right Now 8 My pain is constant, dull and aching Pain is worse with: some activites Pain improves with: heat/ice   In the last MONTH (on 0-10 scale) has pain interfered with the following?  1. General activity like being  able to carry out your everyday physical activities such as walking, climbing stairs, carrying groceries, or moving a chair?  Rating(8)  2. Relation with others like being able to carry out your usual social activities and roles such as  activities at home, at work and in your community. Rating(8)  3. Enjoyment of life such that you have  been bothered by emotional problems such as feeling anxious, depressed or irritable?  Rating(5)

## 2018-12-31 ENCOUNTER — Encounter: Payer: Self-pay | Admitting: Physical Medicine and Rehabilitation

## 2018-12-31 ENCOUNTER — Telehealth: Payer: Self-pay | Admitting: Physical Medicine and Rehabilitation

## 2018-12-31 MED ORDER — DIAZEPAM 5 MG PO TABS: ORAL_TABLET | ORAL | 0 refills | Status: DC

## 2018-12-31 NOTE — Telephone Encounter (Signed)
Sent when you first told me

## 2018-12-31 NOTE — Telephone Encounter (Signed)
Patient left a message wanting to know if the medication had been sent to her pharmacy.  I am not sure which medication she is referring to.  CB#[phone].  Thank you.

## 2018-12-31 NOTE — Telephone Encounter (Signed)
Valium. Please advise.

## 2018-12-31 NOTE — Telephone Encounter (Signed)
Called patient to advise  °

## 2019-01-05 ENCOUNTER — Ambulatory Visit (INDEPENDENT_AMBULATORY_CARE_PROVIDER_SITE_OTHER): Payer: Self-pay | Admitting: Physical Medicine and Rehabilitation

## 2019-01-05 ENCOUNTER — Encounter: Payer: Self-pay | Admitting: Physical Medicine and Rehabilitation

## 2019-01-05 ENCOUNTER — Ambulatory Visit: Payer: Self-pay

## 2019-01-05 VITALS — BP 130/88 | HR 83

## 2019-01-05 DIAGNOSIS — M5416 Radiculopathy, lumbar region: Secondary | ICD-10-CM

## 2019-01-05 MED ORDER — BETAMETHASONE SOD PHOS & ACET 6 (3-3) MG/ML IJ SUSP
12.0000 mg | Freq: Once | INTRAMUSCULAR | Status: AC
  Administered 2019-01-05: 12 mg

## 2019-01-05 NOTE — Progress Notes (Signed)
 .  Numeric Pain Rating Scale and Functional Assessment Average Pain 8   In the last MONTH (on 0-10 scale) has pain interfered with the following?  1. General activity like being  able to carry out your everyday physical activities such as walking, climbing stairs, carrying groceries, or moving a chair?  Rating(8)   +Driver, -BT, -Dye Allergies.  

## 2019-02-12 MED FILL — ESCITALOPRAM 10 MG TABLET: 10 | 30 days supply | Qty: 45 | Fill #1

## 2019-02-15 ENCOUNTER — Encounter: Payer: Self-pay | Admitting: Physical Medicine and Rehabilitation

## 2019-02-15 NOTE — Progress Notes (Signed)
Victoria Snyder - 53 y.o. female MRN [id number]  Date of birth: 01-30-1966  Office Visit Note: Visit Date: 01/05/2019 PCP: Antony Blackbird, MD Referred by: Antony Blackbird, MD  Subjective: Chief Complaint  Patient presents with  . Lower Back - Pain   HPI:  Victoria Snyder is a 53 y.o. female who comes in today For planned left L5 transforaminal epidural steroid injection.  Please see our prior evaluation and management note for further details and justification.  ROS Otherwise per HPI.  Assessment & Plan: Visit Diagnoses:  1. Lumbar radiculopathy     Plan: No additional findings.   Meds & Orders:  Meds ordered this encounter  Medications  . betamethasone acetate-betamethasone sodium phosphate (CELESTONE) injection 12 mg    Orders Placed This Encounter  Procedures  . XR C-ARM NO REPORT  . Epidural Steroid injection    Follow-up: Return if symptoms worsen or fail to improve.   Procedures: No procedures performed  Lumbosacral Transforaminal Epidural Steroid Injection - Sub-Pedicular Approach with Fluoroscopic Guidance  Patient: Victoria Snyder      Date of Birth: 10-Jul-1965 MRN: [id number] PCP: Antony Blackbird, MD      Visit Date: 01/05/2019   Universal Protocol:    Date/Time: 01/05/2019  Consent Given By: the patient  Position: PRONE  Additional Comments: Vital signs were monitored before and after the procedure. Patient was prepped and draped in the usual sterile fashion. The correct patient, procedure, and site was verified.   Injection Procedure Details:  Procedure Site One Meds Administered:  Meds ordered this encounter  Medications  . betamethasone acetate-betamethasone sodium phosphate (CELESTONE) injection 12 mg    Laterality: Left  Location/Site:  L5-S1  Needle size: 22 G  Needle type: Spinal  Needle Placement: Transforaminal  Findings:    -Comments: Excellent flow of contrast along the nerve and into the epidural space.  Procedure Details:  After squaring off the end-plates to get a true AP view, the C-arm was positioned so that an oblique view of the foramen as noted above was visualized. The target area is just inferior to the "nose of the scotty dog" or sub pedicular. The soft tissues overlying this structure were infiltrated with 2-3 ml. of 1% Lidocaine without Epinephrine.  The spinal needle was inserted toward the target using a "trajectory" view along the fluoroscope beam.  Under AP and lateral visualization, the needle was advanced so it did not puncture dura and was located close the 6 O'Clock position of the pedical in AP tracterory. Biplanar projections were used to confirm position. Aspiration was confirmed to be negative for CSF and/or blood. A 1-2 ml. volume of Isovue-250 was injected and flow of contrast was noted at each level. Radiographs were obtained for documentation purposes.   After attaining the desired flow of contrast documented above, a 0.5 to 1.0 ml test dose of 0.25% Marcaine was injected into each respective transforaminal space.  The patient was observed for 90 seconds post injection.  After no sensory deficits were reported, and normal lower extremity motor function was noted,   the above injectate was administered so that equal amounts of the injectate were placed at each foramen (level) into the transforaminal epidural space.   Additional Comments:  The patient tolerated the procedure well Dressing: 2 x 2 sterile gauze and Band-Aid    Post-procedure details: Patient was observed during the procedure. Post-procedure instructions were reviewed.  Patient left the clinic in stable condition.    Clinical History: MRI LUMBAR  SPINE WITHOUT CONTRAST  TECHNIQUE: Multiplanar, multisequence MR imaging of the lumbar spine was performed. No intravenous contrast was administered.  COMPARISON:  Lumbar MRI 05/10/2009.  Radiographs 07/31/2018.  FINDINGS: Segmentation: Conventional anatomy assumed, with  the last open disc space designated L5-S1.Concordant with previous imaging.  Alignment:  Normal.  Vertebrae: No worrisome osseous lesion, acute fracture or pars defect. There are progressive endplate degenerative changes at L5-S1 and progressive facet arthropathy, especially at L3-4.  Conus medullaris: Extends to the T12-L1 level and appears normal.  Paraspinal and other soft tissues: No significant paraspinal findings.  Disc levels:  No significant disc space findings from T11-12 through L1-2.  L2-3: Mild disc bulging with mild facet and ligamentous hypertrophy. No significant spinal stenosis or nerve root encroachment.  L3-4: Mildly progressive annular disc bulging. Moderately progressive facet and ligamentous hypertrophy with associated periarticular soft tissue edema. These findings contribute to mild spinal stenosis and mild narrowing of the lateral recesses and foramina bilaterally.  L4-5: Similar annular disc bulging and focal left paracentral disc protrusion. Mild facet and ligamentous hypertrophy. There is resulting mild spinal stenosis with mild narrowing of the lateral recesses, left greater than right. There is possible encroachment on the left L5 nerve root in the canal. The foramina remain sufficiently patent.  L5-S1: Chronic degenerative disc disease with annular disc bulging and endplate osteophytes laterally. Mild facet and ligamentous hypertrophy. The foramina and lateral recesses are patent. Osteophytes may contribute to chronic extraforaminal L5 nerve root encroachment.  IMPRESSION: 1. Since previous MRI of 2010, progressive annular disc bulging, facet and ligamentous hypertrophy at L3-4, contributing to mild spinal stenosis and mild narrowing of the lateral recesses and foramina bilaterally. 2. Similar findings at L4-5 with annular disc bulging and a left paracentral disc protrusion contributing to mild spinal stenosis and mild narrowing  of the lateral recesses left greater than right. There is possible chronic left L5 nerve root encroachment. 3. Mildly progressive chronic degenerative disc disease at L5-S1 with possible extraforaminal L5 nerve root encroachment by osteophytes.   Electronically Signed   By: Richardean Sale M.D.   On: 10/26/2018 08:19     Objective:  VS:  HT:    WT:   BMI:     BP:130/88  HR:83bpm  TEMP: ( )  RESP:  Physical Exam  Ortho Exam Imaging: No results found.

## 2019-02-15 NOTE — Procedures (Signed)
Lumbosacral Transforaminal Epidural Steroid Injection - Sub-Pedicular Approach with Fluoroscopic Guidance  Patient: Victoria Snyder      Date of Birth: [birth year] MRN: [id number] PCP: Antony Blackbird, MD      Visit Date: 01/05/2019   Universal Protocol:    Date/Time: 01/05/2019  Consent Given By: the patient  Position: PRONE  Additional Comments: Vital signs were monitored before and after the procedure. Patient was prepped and draped in the usual sterile fashion. The correct patient, procedure, and site was verified.   Injection Procedure Details:  Procedure Site One Meds Administered:  Meds ordered this encounter  Medications  . betamethasone acetate-betamethasone sodium phosphate (CELESTONE) injection 12 mg    Laterality: Left  Location/Site:  L5-S1  Needle size: 22 G  Needle type: Spinal  Needle Placement: Transforaminal  Findings:    -Comments: Excellent flow of contrast along the nerve and into the epidural space.  Procedure Details: After squaring off the end-plates to get a true AP view, the C-arm was positioned so that an oblique view of the foramen as noted above was visualized. The target area is just inferior to the "nose of the scotty dog" or sub pedicular. The soft tissues overlying this structure were infiltrated with 2-3 ml. of 1% Lidocaine without Epinephrine.  The spinal needle was inserted toward the target using a "trajectory" view along the fluoroscope beam.  Under AP and lateral visualization, the needle was advanced so it did not puncture dura and was located close the 6 O'Clock position of the pedical in AP tracterory. Biplanar projections were used to confirm position. Aspiration was confirmed to be negative for CSF and/or blood. A 1-2 ml. volume of Isovue-250 was injected and flow of contrast was noted at each level. Radiographs were obtained for documentation purposes.   After attaining the desired flow of contrast documented above, a 0.5 to 1.0  ml test dose of 0.25% Marcaine was injected into each respective transforaminal space.  The patient was observed for 90 seconds post injection.  After no sensory deficits were reported, and normal lower extremity motor function was noted,   the above injectate was administered so that equal amounts of the injectate were placed at each foramen (level) into the transforaminal epidural space.   Additional Comments:  The patient tolerated the procedure well Dressing: 2 x 2 sterile gauze and Band-Aid    Post-procedure details: Patient was observed during the procedure. Post-procedure instructions were reviewed.  Patient left the clinic in stable condition.

## 2019-02-15 NOTE — Progress Notes (Signed)
Victoria Snyder - 53 y.o. female MRN [id number]  Date of birth: 1966/05/17  Office Visit Note: Visit Date: 12/30/2018 PCP: Antony Blackbird, MD Referred by: Antony Blackbird, MD  Subjective: Chief Complaint  Patient presents with  . Lower Back - Pain  . Left Leg - Pain  . Left Hip - Pain   HPI: Victoria Snyder is a 53 y.o. female who comes in today At the request of Dr. Joni Fears and Biagio Borg, PA-C for evaluation management of low back pain with left hip and leg pain.  Patient reports severe pain and rates this as an average of 9 out of 10 which is constant dull and aching.  It is worse with activity better at rest to some degree but can be worse with sitting.  This is been ongoing now for several months of worsening but she reports 10 years of pain.  She reports any real movement makes it worse.  She has been undergoing physical therapy without much relief although it does help with the back pain not the leg pain.  She denies any right-sided complaints.  Left leg pain is more of an L5 distribution down to the ankle.  No real paresthesias.  No groin pain.  She has been evaluated from an intra-articular hip perspective by Dr. Durward Fortes and Aaron Edelman really felt like this was more lumbar related and MRI was performed.  This was reviewed with the patient today and reviewed below.  She does have some anxiety regarding interventional spine procedures at all has not had that done.  No prior lumbar surgery.  Review of Systems  Musculoskeletal: Positive for back pain.       Left radicular leg pain   Otherwise per HPI.  Assessment & Plan: Visit Diagnoses:  1. Lumbar radiculopathy   2. Pre-operative anxiety     Plan: Findings:  Chronic worsening severe left radicular leg pain in the setting of a background of 10 years of chronic off-and-on back pain.  I think her lumbar pain is probably spondylitic arthropathy of the facet joints but her current pain is likely L5 radicular pain from left  paracentral disc protrusion at L4-5 on the MRI.  We discussed treatments at length for both issues.  We can have her come back in as quick as I can to see her for the left L5 transforaminal epidural steroid injection diagnostically and hopefully therapeutically.  This with fluoroscopic guidance.  We talked about the natural course history of these disc protrusions.  We talked about lumbar discectomy.  She is continuing with physical therapy and medications.  We did prescribe Valium preprocedure do to anxiety.  If she does well with her leg pain and wants to look at her back pain would look at diagnostic medial branch blocks.    Meds & Orders:  Meds ordered this encounter  Medications  . diazepam (VALIUM) 5 MG tablet    Sig: Take 1 by mouth 1 hour  pre-procedure with very light food. May bring 2nd tablet to appointment.    Dispense:  2 tablet    Refill:  0   No orders of the defined types were placed in this encounter.   Follow-up: No follow-ups on file.   Procedures: No procedures performed  No notes on file   Clinical History: MRI LUMBAR SPINE WITHOUT CONTRAST  TECHNIQUE: Multiplanar, multisequence MR imaging of the lumbar spine was performed. No intravenous contrast was administered.  COMPARISON:  Lumbar MRI 05/10/2009.  Radiographs 07/31/2018.  FINDINGS:  Segmentation: Conventional anatomy assumed, with the last open disc space designated L5-S1.Concordant with previous imaging.  Alignment:  Normal.  Vertebrae: No worrisome osseous lesion, acute fracture or pars defect. There are progressive endplate degenerative changes at L5-S1 and progressive facet arthropathy, especially at L3-4.  Conus medullaris: Extends to the T12-L1 level and appears normal.  Paraspinal and other soft tissues: No significant paraspinal findings.  Disc levels:  No significant disc space findings from T11-12 through L1-2.  L2-3: Mild disc bulging with mild facet and ligamentous  hypertrophy. No significant spinal stenosis or nerve root encroachment.  L3-4: Mildly progressive annular disc bulging. Moderately progressive facet and ligamentous hypertrophy with associated periarticular soft tissue edema. These findings contribute to mild spinal stenosis and mild narrowing of the lateral recesses and foramina bilaterally.  L4-5: Similar annular disc bulging and focal left paracentral disc protrusion. Mild facet and ligamentous hypertrophy. There is resulting mild spinal stenosis with mild narrowing of the lateral recesses, left greater than right. There is possible encroachment on the left L5 nerve root in the canal. The foramina remain sufficiently patent.  L5-S1: Chronic degenerative disc disease with annular disc bulging and endplate osteophytes laterally. Mild facet and ligamentous hypertrophy. The foramina and lateral recesses are patent. Osteophytes may contribute to chronic extraforaminal L5 nerve root encroachment.  IMPRESSION: 1. Since previous MRI of 2010, progressive annular disc bulging, facet and ligamentous hypertrophy at L3-4, contributing to mild spinal stenosis and mild narrowing of the lateral recesses and foramina bilaterally. 2. Similar findings at L4-5 with annular disc bulging and a left paracentral disc protrusion contributing to mild spinal stenosis and mild narrowing of the lateral recesses left greater than right. There is possible chronic left L5 nerve root encroachment. 3. Mildly progressive chronic degenerative disc disease at L5-S1 with possible extraforaminal L5 nerve root encroachment by osteophytes.   Electronically Signed   By: Richardean Sale M.D.   On: 10/26/2018 08:19   She reports that she has quit smoking. She has never used smokeless tobacco. No results for input(s): HGBA1C, LABURIC in the last 8760 hours.  Objective:  VS:  HT:5\' 9"  (175.3 cm)   WT:150 lb (68 kg)  BMI:22.14    BP:(!) 153/100  HR:74bpm   TEMP: ( )  RESP:  Physical Exam Vitals signs and nursing note reviewed.  Constitutional:      General: She is not in acute distress.    Appearance: Normal appearance. She is well-developed. She is obese. She is not ill-appearing.  HENT:     Head: Normocephalic and atraumatic.  Eyes:     Conjunctiva/sclera: Conjunctivae normal.     Pupils: Pupils are equal, round, and reactive to light.  Cardiovascular:     Rate and Rhythm: Normal rate.     Pulses: Normal pulses.  Pulmonary:     Effort: Pulmonary effort is normal.  Musculoskeletal:     Right lower leg: No edema.     Left lower leg: No edema.     Comments: Patient somewhat slow to arise from seated position to full extension with pain on facet loading.  She does have some pain over the left PSIS and greater trochanter.  No pain with hip rotation she has a positive slump test on the left.  Good distal strength no clonus.  Skin:    General: Skin is warm and dry.     Findings: No erythema or rash.  Neurological:     General: No focal deficit present.     Mental Status: She  is alert and oriented to person, place, and time.     Sensory: No sensory deficit.     Motor: No abnormal muscle tone.     Coordination: Coordination normal.     Gait: Gait normal.  Psychiatric:        Mood and Affect: Mood normal.        Behavior: Behavior normal.     Ortho Exam Imaging: No results found.  Past Medical/Family/Surgical/Social History: Medications & Allergies reviewed per EMR, new medications updated. Patient Active Problem List   Diagnosis Date Noted  . DDD (degenerative disc disease), lumbar 12/17/2018  . Spinal stenosis of lumbar region 12/17/2018  . Hypertension 03/31/2018  . Low back pain with radiation 03/31/2018  . History of vertigo 03/31/2018  . Anxiety and depression 03/31/2018  . Bipolar disorder ([place]) 03/31/2018   Past Medical History:  Diagnosis Date  . Bipolar disorder ([place])   . Blood transfusion without reported  diagnosis   . Hypertension   . Hypokalemia   . Low back pain with radiation   . Vertigo    Family History  Problem Relation Age of Onset  . Heart disease Mother        heart attack  . Stroke Father    Past Surgical History:  Procedure Laterality Date  . ABDOMINAL HYSTERECTOMY     Social History   Occupational History  . Not on file  Tobacco Use  . Smoking status: Former Research scientist (life sciences)  . Smokeless tobacco: Never Used  Substance and Sexual Activity  . Alcohol use: Not Currently    Alcohol/week: 1.0 - 2.0 standard drinks    Types: 1 - 2 Standard drinks or equivalent per week  . Drug use: No  . Sexual activity: Yes

## 2019-02-17 MED FILL — ?AMLODIPINE BESYLATE 5MG TA: 5 | 30 days supply | Qty: 30 | Fill #1

## 2019-02-24 ENCOUNTER — Ambulatory Visit: Payer: Self-pay | Admitting: Licensed Clinical Social Worker

## 2019-02-24 ENCOUNTER — Other Ambulatory Visit: Payer: Self-pay

## 2019-02-24 ENCOUNTER — Encounter: Payer: Self-pay | Admitting: Family Medicine

## 2019-02-24 ENCOUNTER — Ambulatory Visit: Payer: Self-pay | Attending: Family Medicine | Admitting: Family Medicine

## 2019-02-24 VITALS — BP 127/79 | HR 99 | Temp 99.0°F | Resp 16 | Wt 150.0 lb

## 2019-02-24 DIAGNOSIS — F323 Major depressive disorder, single episode, severe with psychotic features: Secondary | ICD-10-CM

## 2019-02-24 DIAGNOSIS — R45851 Suicidal ideations: Secondary | ICD-10-CM

## 2019-02-24 DIAGNOSIS — M5440 Lumbago with sciatica, unspecified side: Secondary | ICD-10-CM

## 2019-02-24 DIAGNOSIS — M545 Low back pain, unspecified: Secondary | ICD-10-CM

## 2019-02-24 DIAGNOSIS — F3132 Bipolar disorder, current episode depressed, moderate: Secondary | ICD-10-CM

## 2019-02-24 DIAGNOSIS — I10 Essential (primary) hypertension: Secondary | ICD-10-CM

## 2019-02-24 MED ORDER — GABAPENTIN 300 MG PO CAPS: 300.0000 mg | ORAL_CAPSULE | Freq: Three times a day (TID) | ORAL | 5 refills | Status: DC

## 2019-02-24 MED ORDER — TIZANIDINE HCL 4 MG PO TABS: 4.0000 mg | ORAL_TABLET | Freq: Three times a day (TID) | ORAL | 1 refills | Status: DC | PRN

## 2019-02-24 MED ORDER — IBUPROFEN 600 MG PO TABS: 600.0000 mg | ORAL_TABLET | Freq: Three times a day (TID) | ORAL | 3 refills | Status: DC | PRN

## 2019-02-24 MED ORDER — AMLODIPINE BESYLATE 5 MG PO TABS: 5.0000 mg | ORAL_TABLET | Freq: Every day | ORAL | 5 refills | Status: DC

## 2019-02-24 MED FILL — tiZANidine HCL 4 MG TABS: 4 | 20 days supply | Qty: 60 | Fill #0

## 2019-02-24 MED FILL — ?IBUPROFEN 600 MG TABS: 600 | 20 days supply | Qty: 60 | Fill #0

## 2019-02-24 MED FILL — GABAPENTIN 300 MG CAPSULE: 300 | 30 days supply | Qty: 90 | Fill #0

## 2019-02-24 NOTE — Progress Notes (Signed)
Established Patient Office Visit  Subjective:  Patient ID: Victoria Snyder, female    DOB: 1965/12/13  Age: 53 y.o. MRN: [id number]  CC:  Chief Complaint  Patient presents with  . Follow-up    HPI Victoria Snyder presents for follow-up of hypertension and patient continues to have issues with chronic low back pain with bilateral radiation to the legs left greater than right.  She has see orthopedics and participate in physical therapy but has had little improvement in her back and leg pain.  She would like to have refill of medications for treatment of her back pain and muscle spasms.  She also needs refill of medication to help with sharp burning pain radiating down her legs left greater than right.  Pain tends to stay at around 8 and sometimes greater than 10 on a 0-to-10 scale.  Pain is worse after movement such as if she has been sitting and then attempts to stand up.  She also has difficulty with sleep secondary to her back pain which sometimes feels as if it is throbbing when she lies down to try and sleep and this keeps her awake.        She is taking amlodipine for her hypertension and feels that this controls her blood pressure.  She denies any headaches or dizziness related to her headaches at this time.  She denies any issues with peripheral edema with the use of amlodipine.        Patient reports that she is followed by [facility] mental health for depression and anxiety however a friend of hers passed away recently which was caused patient to have increased depression and anxiety.  Patient states that over the past 2 years several people close to her have died including her mother who passed away 2 years ago and patient believes that she is never fully recovered from her mother's death.  Past Medical History:  Diagnosis Date  . Bipolar disorder ([place])   . Blood transfusion without reported diagnosis   . Hypertension   . Hypokalemia   . Low back pain with radiation   . Vertigo      Past Surgical History:  Procedure Laterality Date  . ABDOMINAL HYSTERECTOMY      Family History  Problem Relation Age of Onset  . Heart disease Mother        heart attack  . Stroke Father     Social History   Socioeconomic History  . Marital status: Married    Spouse name: Not on file  . Number of children: Not on file  . Years of education: Not on file  . Highest education level: Not on file  Occupational History  . Not on file  Social Needs  . Financial resource strain: Not on file  . Food insecurity    Worry: Not on file    Inability: Not on file  . Transportation needs    Medical: Not on file    Non-medical: Not on file  Tobacco Use  . Smoking status: Former Research scientist (life sciences)  . Smokeless tobacco: Never Used  Substance and Sexual Activity  . Alcohol use: Yes    Alcohol/week: 1.0 - 2.0 standard drinks    Types: 1 - 2 Standard drinks or equivalent per week  . Drug use: No  . Sexual activity: Yes  Lifestyle  . Physical activity    Days per week: Not on file    Minutes per session: Not on file  . Stress: Not on  file  Relationships  . Social Herbalist on phone: Not on file    Gets together: Not on file    Attends religious service: Not on file    Active member of club or organization: Not on file    Attends meetings of clubs or organizations: Not on file    Relationship status: Not on file  . Intimate partner violence    Fear of current or ex partner: Not on file    Emotionally abused: Not on file    Physically abused: Not on file    Forced sexual activity: Not on file  Other Topics Concern  . Not on file  Social History Narrative  . Not on file    Outpatient Medications Prior to Visit  Medication Sig Dispense Refill  . Ascorbic Acid (VITAMIN C) 1000 MG tablet Take 1,000 mg by mouth daily.    . busPIRone (BUSPAR) 15 MG tablet Take 1 tablet (15 mg total) by mouth 3 (three) times daily. 90 tablet 0  . cetirizine (ZYRTEC) 10 MG tablet Take 1 tablet  (10 mg total) by mouth daily. 30 tablet 1  . diclofenac sodium (VOLTAREN) 1 % GEL Apply 4 g topically 4 (four) times daily. To help with knee pain 1 Tube 5  . escitalopram (LEXAPRO) 5 MG tablet Take 1 tablet (5 mg total) by mouth daily. 30 tablet 3  . hydrOXYzine (ATARAX/VISTARIL) 10 MG tablet Take 1-3 tablets (10-30 mg total) by mouth 3 (three) times daily as needed. (Patient taking differently: Take 10-30 mg by mouth 3 (three) times daily as needed for anxiety. ) 90 tablet 0  . meclizine (ANTIVERT) 25 MG tablet Take 1 tablet (25 mg total) by mouth 3 (three) times daily as needed for dizziness. 30 tablet 3  . Multiple Vitamins-Minerals (MULTIVITAMIN WITH MINERALS) tablet Take 1 tablet by mouth daily.    Marland Kitchen amLODipine (NORVASC) 5 MG tablet Take 1 tablet (5 mg total) by mouth daily. To lower blood pressure 30 tablet 3  . gabapentin (NEURONTIN) 300 MG capsule Take 1 capsule (300 mg total) by mouth 3 (three) times daily. To help with back pain with radiation 90 capsule 5  . ibuprofen (ADVIL,MOTRIN) 600 MG tablet Take 1 tablet (600 mg total) by mouth every 8 (eight) hours as needed for moderate pain. Eat before taking medication 60 tablet 1  . tiZANidine (ZANAFLEX) 4 MG tablet Take 1 tablet (4 mg total) by mouth every 8 (eight) hours as needed for muscle spasms. 60 tablet 1  . diazepam (VALIUM) 5 MG tablet Take 1 by mouth 1 hour  pre-procedure with very light food. May bring 2nd tablet to appointment. (Patient not taking: Reported on 02/24/2019) 2 tablet 0  . fluticasone (FLONASE) 50 MCG/ACT nasal spray Place 1 spray into both nostrils daily. (Patient not taking: Reported on 02/24/2019) 16 g 2   No facility-administered medications prior to visit.     No Known Allergies  ROS Review of Systems  Constitutional: Positive for fatigue. Negative for chills and fever.  HENT: Negative for sore throat and trouble swallowing.   Eyes: Negative for photophobia and visual disturbance.  Respiratory: Negative for  cough and shortness of breath.   Cardiovascular: Negative for chest pain, palpitations and leg swelling.  Gastrointestinal: Negative for abdominal pain, blood in stool, constipation and diarrhea.  Endocrine: Negative for cold intolerance, heat intolerance, polydipsia, polyphagia and polyuria.  Genitourinary: Negative for dysuria and frequency.  Musculoskeletal: Positive for back pain. Negative for arthralgias.  Neurological: Negative for dizziness and headaches.  Hematological: Negative for adenopathy. Does not bruise/bleed easily.  Psychiatric/Behavioral: Positive for dysphoric mood and sleep disturbance (due to back pain/leg pain). Negative for self-injury and suicidal ideas. The patient is nervous/anxious.       Objective:    Physical Exam  Constitutional: She is oriented to person, place, and time. She appears well-developed and well-nourished.  Neck: Normal range of motion. Neck supple. No JVD present.  Cardiovascular: Normal rate and regular rhythm.  Pulmonary/Chest: Effort normal and breath sounds normal.  Abdominal: Soft. There is no abdominal tenderness. There is no rebound and no guarding.  Musculoskeletal:        General: Tenderness (Patient with lumbosacral tenderness and bilateral seated positive leg raise) present. No edema.  Lymphadenopathy:    She has no cervical adenopathy.  Neurological: She is alert and oriented to person, place, and time.  Skin: Skin is warm and dry.  Psychiatric: Her behavior is normal.  Slightly flattened affect  Nursing note and vitals reviewed.   BP 127/79 (BP Location: Left Arm, Patient Position: Sitting, Cuff Size: Normal)   Pulse 99   Temp 99 F (37.2 C) (Oral)   Resp 16   Wt 150 lb (68 kg)   SpO2 98%   BMI 22.15 kg/m  Wt Readings from Last 3 Encounters:  02/24/19 150 lb (68 kg)  12/30/18 150 lb (68 kg)  12/17/18 169 lb (76.7 kg)     Health Maintenance Due  Topic Date Due  . HIV Screening  10/13/1979  . PAP SMEAR-Modifier   10/12/1985  . COLONOSCOPY  10/13/2014  . MAMMOGRAM  12/15/2014    Lab Results  Component Value Date   TSH 1.840 10/22/2017   Lab Results  Component Value Date   WBC 5.9 12/13/2017   HGB 16.3 (H) 12/13/2017   HCT 46.5 (H) 12/13/2017   MCV 88.6 12/13/2017   PLT 302 12/13/2017   Lab Results  Component Value Date   NA 145 (H) 03/27/2018   K 4.6 03/27/2018   CO2 26 03/27/2018   GLUCOSE 67 03/27/2018   BUN 13 03/27/2018   CREATININE 0.80 03/27/2018   BILITOT 0.6 10/22/2017   ALKPHOS 87 10/22/2017   AST 20 10/22/2017   ALT 20 10/22/2017   PROT 7.6 10/22/2017   ALBUMIN 4.9 10/22/2017   CALCIUM 10.3 (H) 03/27/2018   ANIONGAP 13 12/13/2017   Lab Results  Component Value Date   CHOL 203 (H) 10/22/2017   Lab Results  Component Value Date   HDL 79 10/22/2017   Lab Results  Component Value Date   LDLCALC 99 10/22/2017   Lab Results  Component Value Date   TRIG 123 10/22/2017   Lab Results  Component Value Date   CHOLHDL 2.6 10/22/2017   Lab Results  Component Value Date   HGBA1C 5.2 10/22/2017      Assessment & Plan:  1. Essential hypertension Patient's blood pressure is now controlled with the use of amlodipine 5 mg and refill provided.  She is encouraged to continue a Dash type diet and exercise.  She will have BMP in follow-up of medication and hypertension. - amLODipine (NORVASC) 5 MG tablet; Take 1 tablet (5 mg total) by mouth daily. To lower blood pressure  Dispense: 30 tablet; Refill: 5 - Basic Metabolic Panel  2. Low back pain with radiation Patient with low back pain with radiation for which she has had chronic issues.  She is provided with a refill of ibuprofen, Zanaflex  for muscle spasms and gabapentin to help with neuropathic pain.  She will also be referred to orthopedics for further evaluation and treatment. - ibuprofen (ADVIL) 600 MG tablet; Take 1 tablet (600 mg total) by mouth every 8 (eight) hours as needed for moderate pain. Eat before taking  medication  Dispense: 60 tablet; Refill: 3 - AMB referral to orthopedics - gabapentin (NEURONTIN) 300 MG capsule; Take 1 capsule (300 mg total) by mouth 3 (three) times daily. To help with back pain with radiation  Dispense: 90 capsule; Refill: 5 - tiZANidine (ZANAFLEX) 4 MG tablet; Take 1 tablet (4 mg total) by mouth every 8 (eight) hours as needed for muscle spasms.  Dispense: 60 tablet; Refill: 1  3. Bipolar affective disorder, currently depressed, moderate ([place]) Patient with high scores on her PHQ 9 and gad 7 screenings for depression and anxiety respectively at today's visit.  Patient denies suicidal plan but sometimes thinks that things might be easier if she were just not to wake up one morning.  She reports that she is currently grieving the loss of a close friend and has had several deaths of friends and family members within the past 2 years and has been especially affected by the death of her mother 2 years ago.  She is established with [place] mental health for ongoing treatment but does agree to speak with the social worker here today.   Meds ordered this encounter  Medications  . amLODipine (NORVASC) 5 MG tablet    Sig: Take 1 tablet (5 mg total) by mouth daily. To lower blood pressure    Dispense:  30 tablet    Refill:  5  . ibuprofen (ADVIL) 600 MG tablet    Sig: Take 1 tablet (600 mg total) by mouth every 8 (eight) hours as needed for moderate pain. Eat before taking medication    Dispense:  60 tablet    Refill:  3  . gabapentin (NEURONTIN) 300 MG capsule    Sig: Take 1 capsule (300 mg total) by mouth 3 (three) times daily. To help with back pain with radiation    Dispense:  90 capsule    Refill:  5  . tiZANidine (ZANAFLEX) 4 MG tablet    Sig: Take 1 tablet (4 mg total) by mouth every 8 (eight) hours as needed for muscle spasms.    Dispense:  60 tablet    Refill:  1   An After Visit Summary was printed and given to the patient.  Follow-up: Return in about 3 months  (around 05/26/2019) for HTN and as needed for back pain.    Antony Blackbird, MD

## 2019-02-24 NOTE — Progress Notes (Signed)
Back pain-  Feels like a toothache and radiates to left leg. Stiffness. Worse with sitting Would like refill on pain medications Went to Ortho-   Out of medications over 1 month

## 2019-02-25 LAB — BASIC METABOLIC PANEL WITH GFR
BUN/Creatinine Ratio: 21 (ref 9–23)
BUN: 18 mg/dL (ref 6–24)
CO2: 24 mmol/L (ref 20–29)
Calcium: 9.9 mg/dL (ref 8.7–10.2)
Chloride: 102 mmol/L (ref 96–106)
Creatinine, Ser: 0.86 mg/dL (ref 0.57–1.00)
GFR calc Af Amer: 89 mL/min/1.73
GFR calc non Af Amer: 77 mL/min/1.73
Glucose: 122 mg/dL — ABNORMAL HIGH (ref 65–99)
Potassium: 3.8 mmol/L (ref 3.5–5.2)
Sodium: 142 mmol/L (ref 134–144)

## 2019-02-25 NOTE — BH Specialist Note (Signed)
Integrated Behavioral Health Initial Visit  MRN: [id number] Name: Victoria Snyder  Number of [place] Clinician visits:: 1/6 Session Start time: 3:20pm  Session End time: 4:10pm Total time: 50 minutes  Type of Service: [place] Interpretor:No. Interpretor Name and Language: N/A   Warm Hand Off Completed.       SUBJECTIVE: Victoria Snyder is a 53 y.o. female accompanied by Self Patient was referred by Dr. Chapman Fitch for high PHQ9 and GAD7 scores. Patient reports the following symptoms/concerns: Depression and anxiety with psychosis Duration of problem: 1 year; Severity of problem: severe  OBJECTIVE: Mood: Dysphoric and Affect: Depressed Risk of harm to self or others: Suicidal ideation  LIFE CONTEXT: Family and Social: Pt is married and lives in home with husband. Pt is enrolled in a mortgage assistance program with [employer]. Patient has 7 sisters and 8 brothers. Pt has social support from her two sisters Santiago Glad and Neoma Laming. Pt reported that she has a strained relationship with her husband, but he manages her medication for her. Husband has ongoing medical issues since a knee surgery 2 years ago. He is unemployed due to his medical condition. Pt has an upcoming court date for a civil dispute with her daughter. School/Work: Pt is not working. Pt has food stamps.  Self-Care: Patient has felt overwhelmed at many efforts to self care. Pt used to competitively lift weights and reports that she used to enjoy gardening. Pt finds comfort spending time in her room alone. Pt sees a counselor 2x per month at [facility] and receives medication maintenance for Bipolar and psychosis symptoms at [facility]. Pt enjoys [place] with her twin granddaughters, age 91.  Life Changes: Pt has experienced compounded grief; she lost her mother 2 years ago, her brother 1 year ago, and her friend Tiny in the past month. Her friend was hit by a car and killed.     GOALS ADDRESSED: Patient will: 1. Reduce symptoms of: anxiety and depression 2. Increase knowledge and/or ability of: coping skills and self-management skills  3. Demonstrate ability to: Increase healthy adjustment to current life circumstances and Increase adequate support systems for patient/family  INTERVENTIONS: Interventions utilized: Solution-Focused Strategies, Supportive Counseling, Medication Monitoring and Link to [employer]  Standardized Assessments completed: GAD-7 and PHQ 9  ASSESSMENT: Patient currently experiencing symptoms of depression and anxiety with episodes of psychosis marked by hallucinations. The hallucinations were pt reported and were not active during the encounter. Pt reports hopelessness reporting she "doesn't see progress right now" in terms of her mood and medication effectiveness. Pt reports she hears voices that talk to one another. Pt is afraid of voices and feels like someone is watching her. Pt reported vague suicidal ideations but no active plan or intent. When prompted, patient reported that she has thought before about taking sleeping pills to complete suicide. Patient has been using psychiatric services with [facility], but does not feel like her medications are working well. Pt has a follow up appointment with [facility] psychiatry 03/09/19 to address medication management. Pt has been diagnosed with Bipolar II disorder and also complains of vertigo. She reports a history of noncompliance with psychiatric medication due to confusion about keeping on a regular schedule. Pt's husband currently keeps track of her medication to increase compliance. Pt reports a poor appetite but tries to eat when she takes her medication.  Pt is experiencing compounded grief triggered by a recent loss of her friend Tiny who was killed in the past month after being  hit by a car. Pt has also lost her mom 2 years ago and her brother 1 year ago. Patient disclosed she is working  through grief with her counselor, Dentist at [facility]. Pt has good insight into her problems. Pt has several coping skills that she hopes to try soon including walking, journaling, and calling a friend.  BHI completed a collaborative safety plan with patient and also provided crisis resources including the suicide hotline phone number. Safety plan included self-distracting behaviors and social supports. Pt identified coping mechanisms to use including self-distracting activities and social supports if she experiences suicidal intent. BHI also provided pt with a resource for Out of the [employer] and encouraged pt to continue to work with her therapist and psychiatrist at [facility]. Pt identified protective factors including relationships with 2 of her sisters, and reported her twin granddaughters, and her son, Nicole Kindred are worth living for.      Patient may benefit from follow up care with [facility], a safety plan, and crisis resources.  PLAN: 1. Follow up with behavioral health clinician on: BHI will follow up with patient next week to follow up regarding suicidal ideations.  2. Behavioral recommendations: Pt encouraged to use behaviors listed in safety plan to cope with difficult emotions and use crisis resources provided. Pt encouraged to follow up with [facility] for medication management appointment on 03/09/19 and continue working with therapist Cassandra via telehealth.  3. Referral(s): Community Resources:  Food and crisis resources provided. 4. "From scale of 1-10, how likely are you to follow plan?": 8  Kenna Gilbert [facility] 02/25/2019 5:46pm

## 2019-03-04 ENCOUNTER — Telehealth: Payer: Self-pay | Admitting: Licensed Clinical Social Worker

## 2019-03-04 NOTE — Telephone Encounter (Signed)
BHI placed call to pt to follow up on suicidal ideations present during last week's encounter. Pt currently denies SI/HI but reports her sx of depression are triggered by chronic pain. BHI confirmed that 3 of pt's pain-related medications are available at [facility] pharmacy to pick up (Gabapentin, Zanaflex, & Ibuprofen). Pt is enrolled in [facility] for medication financial assistance.   Pt was also waiting on a call from orthopedist per referral per Dr. Chapman Fitch. Patient is uninsured. Alinda Sierras recently sent letter to pt instructing her that she needs to apply for CFA to follow through with referral that has not been delivered to pt as of now. BHI informed pt of referral status/need for CFA application. BHI provided Jasmine William's direct line if any questions arise regarding CFA application. BHI will follow up with pt in next 2 weeks.

## 2019-03-11 MED FILL — hydrOXYzine HCL 25 MG TABS: 25 | 30 days supply | Qty: 90 | Fill #0

## 2019-03-11 MED FILL — ESCITALOPRAM 20 MG TABLET: 20 | 30 days supply | Qty: 30 | Fill #0

## 2019-03-11 MED FILL — QUETIAPINE FUMARATE 100 MG: 100 | 30 days supply | Qty: 30 | Fill #0

## 2019-03-11 MED FILL — ?BUSPIRONE HCL 15 MG TABLET: 15 | 30 days supply | Qty: 60 | Fill #0

## 2019-03-23 MED FILL — ?AMLODIPINE BESYLATE 5MG TA: 5 | 30 days supply | Qty: 30 | Fill #2

## 2019-03-23 MED FILL — ?BUSPIRONE HCL 15 MG TABLET: 15 | 30 days supply | Qty: 60 | Fill #0

## 2019-04-05 ENCOUNTER — Telehealth: Payer: Self-pay | Admitting: Licensed Clinical Social Worker

## 2019-04-05 NOTE — Telephone Encounter (Signed)
Pt placed call to Social Work Intern inquiring next step needed to apply for financial counseling. Pt stated she completed [facility] Financial Application but did not provide documentation. Pt states she sent application to [facility] via mail (unknown if it was sent to [facility] or Hospital address provided in application). Social Work Nature conservation officer a new application to [employer], encouraged pt to collect documentation and call [place] to make an appointment with Clifton James (Development worker, community).   Pt disclosed her brother passed away 2 days ago. Pt reports she has an appointment with her therapist at [facility] at the end of the month. Pt reports she has talked with her 2 sisters for support. SW Intern encouraged pt to contact SW Intern if she is in need of additional support and provided pt with brief information about [facility].

## 2019-05-17 MED FILL — ESCITALOPRAM 20 MG TABLET: 20 | 30 days supply | Qty: 30 | Fill #1

## 2019-06-03 ENCOUNTER — Encounter: Payer: Self-pay | Admitting: Family Medicine

## 2019-06-03 ENCOUNTER — Other Ambulatory Visit: Payer: Self-pay

## 2019-06-03 ENCOUNTER — Ambulatory Visit: Payer: Self-pay | Attending: Family Medicine | Admitting: Family Medicine

## 2019-06-03 DIAGNOSIS — I10 Essential (primary) hypertension: Secondary | ICD-10-CM

## 2019-06-03 DIAGNOSIS — Z789 Other specified health status: Secondary | ICD-10-CM

## 2019-06-03 DIAGNOSIS — M545 Low back pain, unspecified: Secondary | ICD-10-CM

## 2019-06-03 DIAGNOSIS — Z791 Long term (current) use of non-steroidal anti-inflammatories (NSAID): Secondary | ICD-10-CM

## 2019-06-03 DIAGNOSIS — Z79899 Other long term (current) drug therapy: Secondary | ICD-10-CM

## 2019-06-03 DIAGNOSIS — R739 Hyperglycemia, unspecified: Secondary | ICD-10-CM

## 2019-06-03 DIAGNOSIS — M1611 Unilateral primary osteoarthritis, right hip: Secondary | ICD-10-CM

## 2019-06-03 MED ORDER — AMLODIPINE BESYLATE 5 MG PO TABS: 5.0000 mg | ORAL_TABLET | Freq: Every day | ORAL | 1 refills | Status: DC

## 2019-06-03 MED ORDER — GABAPENTIN 300 MG PO CAPS: 300.0000 mg | ORAL_CAPSULE | Freq: Three times a day (TID) | ORAL | 4 refills | Status: DC

## 2019-06-03 MED ORDER — IBUPROFEN 600 MG PO TABS: 600.0000 mg | ORAL_TABLET | Freq: Three times a day (TID) | ORAL | 4 refills | Status: DC | PRN

## 2019-06-03 MED FILL — GABAPENTIN 300 MG CAPSULE: 300 | 30 days supply | Qty: 90 | Fill #0

## 2019-06-03 MED FILL — ?IBUPROFEN 600 MG TABLETS: 600 | 20 days supply | Qty: 60 | Fill #0

## 2019-06-03 MED FILL — ?BUSPIRONE HCL 15 MG TABLET: 15 | 30 days supply | Qty: 60 | Fill #0

## 2019-06-03 MED FILL — AMLODIPINE BESYLATE 5 MG TA: 5 | 30 days supply | Qty: 30 | Fill #0

## 2019-06-03 MED FILL — hydrOXYzine HCL 25 MG TABS: 25 | 30 days supply | Qty: 90 | Fill #0

## 2019-06-03 MED FILL — QUETIAPINE FUMARATE 200 MG: 200 | 30 days supply | Qty: 30 | Fill #0

## 2019-06-03 NOTE — Progress Notes (Signed)
Virtual Visit via Telephone Note  I connected with Victoria Snyder on 06/03/19 at  8:30 AM EST by telephone and verified that I am speaking with the correct person using two identifiers.   I discussed the limitations, risks, security and privacy concerns of performing an evaluation and management service by telephone and the availability of in person appointments. I also discussed with the patient that there may be a patient responsible charge related to this service. The patient expressed understanding and agreed to proceed.  Patient Location: Home Provider Location: [facility] Others participating in call: none  Call placed to patient at 8:45 am however call went to voicemail. Per CMA, patient has an appointment with another provider this am at 8:30 as well so will attempt to contact patient again later for today's appointment. Verne Grain, MD  History of Present Illness:         54 year old female seen in follow-up of chronic medical issues including hypertension, chronic low back pain with radiation, chronic right hip pain and patient reports that she is status post appointment earlier this morning with her mental health professional and had increase in her dose of Seroquel to help with depression.  She would like to have an additional epidural steroid injection but has not yet completed financial assistance paperwork.  She reports that the injection really helped but because of her depression she has not completed the paperwork.  She did talk with the social worker here at the office last year who offered assistance with the paperwork and patient needs to follow-up with the social worker so that she can help with paperwork completion.          She reports that her current pain is about a 6-7 on a 0-to-10 scale.  She reports that the gabapentin did help as well as use of ibuprofen and she needs refill of these medications.  Patient has sharp and burning pain in her lower back that radiates mostly  down her right leg but occasionally now in the left as well.  She denies any headaches or dizziness related to her blood pressure and feels that her blood pressure is well controlled on her current medication.  She has had no issues with chest pain or palpitations, no shortness of breath or cough.  She denies any abdominal pain, no nausea no blood in the stool and no dark stools related to her use of ibuprofen.  She is having no current issues with vertigo/dizziness related to ibuprofen or from her past issues with vertigo.  She denies any suicidal thoughts or ideation.  She does have some mild fatigue.  No urinary frequency no blurred vision and no increased thirst.   Past Medical History:  Diagnosis Date  . Bipolar disorder ([place])   . Blood transfusion without reported diagnosis   . Hypertension   . Hypokalemia   . Low back pain with radiation   . Vertigo     Past Surgical History:  Procedure Laterality Date  . ABDOMINAL HYSTERECTOMY      Family History  Problem Relation Age of Onset  . Heart disease Mother        heart attack  . Stroke Father     Social History   Tobacco Use  . Smoking status: Former Research scientist (life sciences)  . Smokeless tobacco: Never Used  Substance Use Topics  . Alcohol use: Yes    Alcohol/week: 1.0 - 2.0 standard drinks    Types: 1 - 2 Standard drinks or equivalent per  week  . Drug use: No     No Known Allergies     Observations/Objective: No vital signs or physical exam conducted as visit was done via telephone  Assessment and Plan: 1. Essential hypertension She reports that her blood pressure has been stable and she has had no symptoms related to hypertension.  Refill provided of amlodipine 5 mg daily. - amLODipine (NORVASC) 5 MG tablet; Take 1 tablet (5 mg total) by mouth daily. To lower blood pressure  Dispense: 90 tablet; Refill: 1  2. Low back pain with radiation; 3. Primary OA of right hip Referral placed to orthopedics in follow-up of patient's issues of  chronic low back pain with radiation and osteoarthritis of the right hip.  Refills provided of ibuprofen to take as needed for pain and refill of gabapentin for neuropathic pain. - AMB referral to orthopedics - gabapentin (NEURONTIN) 300 MG capsule; Take 1 capsule (300 mg total) by mouth 3 (three) times daily. To help with back pain with radiation  Dispense: 90 capsule; Refill: 4 - ibuprofen (ADVIL) 600 MG tablet; Take 1 tablet (600 mg total) by mouth every 8 (eight) hours as needed for moderate pain. Eat before taking medication  Dispense: 60 tablet; Refill: 4  4. Need for follow-up by social worker Referral placed for continued social work follow-up due to patient's issues with depression and patient's need for assistance with completion of paperwork for financial assistance program - Ambulatory referral to Social Work  5. Elevated blood sugar level On review of past labs, patient with glucose of 122 on most recent blood work and will schedule lab visit for repeat BMP and hemoglobin [id number]. - Basic Metabolic Panel; Future - Hemoglobin A1C; Future  6. Encounter for long-term (current) use of NSAIDs We will obtain CBC at upcoming visit and follow-up of patient's long-term use of nonsteroidal anti-inflammatories to look for any anemia or platelet disorders related to the use of NSAIDs for pain as well as BMP to look for any renal dysfunction related to use of NSAIDs - CBC; Future  7. Encounter for long-term current use of medication We will check electrolytes as part of basic metabolic panel at upcoming lab visit to make sure the renal function and electrolytes are stable with patient's long-term use of over-the-counter and prescription pain medications, as well as medication for the treatment of hypertension - Basic Metabolic Panel; Future  Follow Up Instructions:Return in about 4 months (around 10/01/2019) for chronic issues; lab visit in 1-2 weeks.    I discussed the assessment and treatment  plan with the patient. The patient was provided an opportunity to ask questions and all were answered. The patient agreed with the plan and demonstrated an understanding of the instructions.   The patient was advised to call back or seek an in-person evaluation if the symptoms worsen or if the condition fails to improve as anticipated.  I provided 12 minutes of non-face-to-face time during this encounter.   Antony Blackbird, MD

## 2019-06-09 ENCOUNTER — Ambulatory Visit: Payer: Self-pay | Attending: Family Medicine

## 2019-06-09 ENCOUNTER — Other Ambulatory Visit: Payer: Self-pay

## 2019-06-09 DIAGNOSIS — R739 Hyperglycemia, unspecified: Secondary | ICD-10-CM

## 2019-06-09 DIAGNOSIS — Z791 Long term (current) use of non-steroidal anti-inflammatories (NSAID): Secondary | ICD-10-CM

## 2019-06-09 DIAGNOSIS — Z79899 Other long term (current) drug therapy: Secondary | ICD-10-CM

## 2019-06-10 LAB — CBC
Hematocrit: 43.7 % (ref 34.0–46.6)
Hemoglobin: 15.1 g/dL (ref 11.1–15.9)
MCH: 32.2 pg (ref 26.6–33.0)
MCHC: 34.6 g/dL (ref 31.5–35.7)
MCV: 93 fL (ref 79–97)
Platelets: 292 x10E3/uL (ref 150–450)
RBC: 4.69 x10E6/uL (ref 3.77–5.28)
RDW: 12.1 % (ref 11.7–15.4)
WBC: 4.9 x10E3/uL (ref 3.4–10.8)

## 2019-06-10 LAB — BASIC METABOLIC PANEL WITH GFR
BUN/Creatinine Ratio: 13 (ref 9–23)
BUN: 11 mg/dL (ref 6–24)
CO2: 21 mmol/L (ref 20–29)
Calcium: 9.8 mg/dL (ref 8.7–10.2)
Chloride: 102 mmol/L (ref 96–106)
Creatinine, Ser: 0.88 mg/dL (ref 0.57–1.00)
GFR calc Af Amer: 86 mL/min/1.73
GFR calc non Af Amer: 75 mL/min/1.73
Glucose: 110 mg/dL — ABNORMAL HIGH (ref 65–99)
Potassium: 4.3 mmol/L (ref 3.5–5.2)
Sodium: 140 mmol/L (ref 134–144)

## 2019-06-10 LAB — HEMOGLOBIN A1C
Est. average glucose Bld gHb Est-mCnc: 94 mg/dL
Hgb A1c MFr Bld: 4.9 % (ref 4.8–5.6)

## 2019-06-14 ENCOUNTER — Telehealth: Payer: Self-pay | Admitting: *Deleted

## 2019-06-14 NOTE — Telephone Encounter (Signed)
Patient verified DOB Patient is aware of A1C being normal but needing to inquire about low sugar levels. Patient expressed no concerns and will follow up as planned.

## 2019-06-14 NOTE — Telephone Encounter (Signed)
-----   Message from Antony Blackbird, MD sent at 06/12/2019  2:20 PM EST ----- Hemoglobin A1c of 4.9 which indicates that blood sugars have been normal over the past 90 days however a low hemoglobin A1c can also indicate issues with low blood sugars or issues with the liver regulating release of blood sugar.  Glucose was [id number] on basic metabolic panel and electrolytes otherwise normal.  Complete blood count normal.

## 2019-06-17 ENCOUNTER — Ambulatory Visit: Payer: Medicaid Other | Attending: Physician Assistant | Admitting: Physician Assistant

## 2019-06-17 ENCOUNTER — Other Ambulatory Visit: Payer: Self-pay

## 2019-06-17 DIAGNOSIS — R110 Nausea: Secondary | ICD-10-CM

## 2019-06-17 DIAGNOSIS — R432 Parageusia: Secondary | ICD-10-CM

## 2019-06-17 DIAGNOSIS — Z20822 Contact with and (suspected) exposure to covid-19: Secondary | ICD-10-CM

## 2019-06-17 MED ORDER — ONDANSETRON HCL 8 MG PO TABS: 8.0000 mg | ORAL_TABLET | Freq: Three times a day (TID) | ORAL | 0 refills | Status: DC | PRN

## 2019-06-17 MED FILL — ONDANSETRON HCL 8 MG TABLET: 8 | 7 days supply | Qty: 20 | Fill #0

## 2019-06-17 NOTE — Progress Notes (Signed)
Virtual Visit via Telephone Note  I connected with Victoria Snyder on 06/17/19 at  1:30 PM EST by telephone and verified that I am speaking with the correct person using two identifiers.   I discussed the limitations, risks, security and privacy concerns of performing an evaluation and management service by telephone and the availability of in person appointments. I also discussed with the patient that there may be a patient responsible charge related to this service. The patient expressed understanding and agreed to proceed.  PATIENT visit by telephone virtually in the context of Covid-19 pandemic. Patient location:  home My Location:  [place] office Persons on the call:  Me and the patient   History of Present Illness:  When patient called in she was feeling bad and had chills.  Today is day 6 of her symptoms.  No cough.  No respiratory issues/SOB or wheezing.  No temp.  Some nausea.  No vomiting.  Low energy.  No urinary s/sx.  No abdominal pain or diarrhea.  Eating and drinking ok but has poor appetite  [facility] [facility] yesterday-Covid test done but results not back  I reviewed her recent labs    Observations/Objective:  A&Ox3   Assessment and Plan: 1. Loss of taste Likely #3.  Should return  2. Nausea - ondansetron (ZOFRAN) 8 MG tablet; Take 1 tablet (8 mg total) by mouth every 8 (eight) hours as needed for nausea or vomiting.  Dispense: 20 tablet; Refill: 0  3. Suspected COVID-19 virus infection Await results but quarantine [address] bc symptomns highly suspicious.  Fluids, rest.  If anything worsens or develops SOB, go to the ED.  Patient verbalizes understanding.    Follow Up Instructions: See PCP as next planned  I discussed the assessment and treatment plan with the patient. The patient was provided an opportunity to ask questions and all were answered. The patient agreed with the plan and demonstrated an understanding of the instructions.   The patient was advised to  call back or seek an in-person evaluation if the symptoms worsen or if the condition fails to improve as anticipated.  I provided 14 minutes of non-face-to-face time during this encounter.   Freeman Caldron, PA-C  Patient ID: Victoria Snyder, female   DOB: Dec 03, 1965, 54 y.o.   MRN: [id number]

## 2019-06-18 MED FILL — ESCITALOPRAM 20 MG TABLET: 20 | 30 days supply | Qty: 30 | Fill #2

## 2019-06-24 ENCOUNTER — Telehealth: Payer: Self-pay | Admitting: Licensed Clinical Social Worker

## 2019-06-24 NOTE — Telephone Encounter (Signed)
Call placed to patient regarding [facility] referral, regarding assistance with paperwork. Pt reported that she is interested in applying for financial assistance; however, has been feeling under the weather and has questions about the process.   LCSW provided her direct contact information and strongly encouraged patient to schedule an appointment for assistance, once pt feels better. Pt was appreciative for the assistance. No additional concerns were noted.

## 2019-07-01 ENCOUNTER — Telehealth: Payer: Self-pay | Admitting: Licensed Clinical Social Worker

## 2019-07-01 NOTE — Telephone Encounter (Signed)
This MSW intern placed call to patient to assist patient in completing [place] Application (CFA). Patient did not answer, unable to leave a voicemail.   This MSW Intern spoke with patient via telephone yesterday and pt reports she would like assistance completing CFA application, but due to clinic policy, pt is unable to attend an in-person appointment until 30 days after testing positive for Covid-19. This MSW intern will attempt to contact patient again in the next week.

## 2019-07-07 ENCOUNTER — Telehealth: Payer: Self-pay | Admitting: Licensed Clinical Social Worker

## 2019-07-07 NOTE — Telephone Encounter (Signed)
This MSW intern placed call to patient to f/u regarding patient's request to receive assistance with [facility]. Per patient request, this MSW intern will call patient 07/08/19 to offer assistance.

## 2019-07-08 ENCOUNTER — Ambulatory Visit: Payer: Medicaid Other | Attending: Family Medicine | Admitting: Licensed Clinical Social Worker

## 2019-07-08 DIAGNOSIS — F419 Anxiety disorder, unspecified: Secondary | ICD-10-CM

## 2019-07-08 DIAGNOSIS — F329 Major depressive disorder, single episode, unspecified: Secondary | ICD-10-CM

## 2019-07-08 DIAGNOSIS — F32A Depression, unspecified: Secondary | ICD-10-CM

## 2019-07-09 ENCOUNTER — Telehealth: Payer: Self-pay | Admitting: Licensed Clinical Social Worker

## 2019-07-09 NOTE — Telephone Encounter (Signed)
This MSW Intern placed call to patient to provide assistance with CAFA application. Patient did not answer, left voicemail requesting return call.

## 2019-07-16 NOTE — BH Specialist Note (Signed)
See note from 07/09/19

## 2019-08-10 MED FILL — AMLODIPINE BESYLATE 5 MG TA: 5 | 30 days supply | Qty: 30 | Fill #1

## 2019-08-12 ENCOUNTER — Ambulatory Visit: Payer: Medicaid Other | Admitting: Licensed Clinical Social Worker

## 2019-08-12 ENCOUNTER — Telehealth: Payer: Self-pay | Admitting: Family Medicine

## 2019-08-12 NOTE — Telephone Encounter (Signed)
Keran called wanting to talk to Victoria Snyder as I was trying to reach out to Victoria Snyder she hung up the call.

## 2019-08-16 ENCOUNTER — Encounter: Payer: Self-pay | Admitting: Family

## 2019-08-16 ENCOUNTER — Other Ambulatory Visit: Payer: Self-pay

## 2019-08-16 ENCOUNTER — Ambulatory Visit: Payer: Self-pay | Attending: Family | Admitting: Family

## 2019-08-16 VITALS — BP 135/87 | HR 86 | Temp 98.3°F | Ht 69.0 in | Wt 155.8 lb

## 2019-08-16 DIAGNOSIS — M545 Low back pain, unspecified: Secondary | ICD-10-CM

## 2019-08-16 DIAGNOSIS — M1611 Unilateral primary osteoarthritis, right hip: Secondary | ICD-10-CM

## 2019-08-16 DIAGNOSIS — F329 Major depressive disorder, single episode, unspecified: Secondary | ICD-10-CM

## 2019-08-16 DIAGNOSIS — F419 Anxiety disorder, unspecified: Secondary | ICD-10-CM

## 2019-08-16 DIAGNOSIS — F32A Depression, unspecified: Secondary | ICD-10-CM

## 2019-08-16 NOTE — Patient Instructions (Addendum)
Referral to orthopedics. Referral to social work please schedule appointment. Complete financial assistance documentation. Chronic Back Pain When back pain lasts longer than 3 months, it is called chronic back pain. Pain may get worse at certain times (flare-ups). There are things you can do at home to manage your pain. Follow these instructions at home: Activity      Avoid bending and other activities that make pain worse.  When standing: ? Keep your upper back and neck straight. ? Keep your shoulders pulled back. ? Avoid slouching.  When sitting: ? Keep your back straight. ? Relax your shoulders. Do not round your shoulders or pull them backward.  Do not sit or stand in one place for long periods of time.  Take short rest breaks during the day. Lying down or standing is usually better than sitting. Resting can help relieve pain.  When sitting or lying down for a long time, do some mild activity or stretching. This will help to prevent stiffness and pain.  Get regular exercise. Ask your doctor what activities are safe for you.  Do not lift anything that is heavier than 10 lb (4.5 kg). To prevent injury when you lift things: ? Bend your knees. ? Keep the weight close to your body. ? Avoid twisting. Managing pain  If told, put ice on the painful area. Your doctor may tell you to use ice for 24-48 hours after a flare-up starts. ? Put ice in a plastic bag. ? Place a towel between your skin and the bag. ? Leave the ice on for 20 minutes, 2-3 times a day.  If told, put heat on the painful area as often as told by your doctor. Use the heat source that your doctor recommends, such as a moist heat pack or a heating pad. ? Place a towel between your skin and the heat source. ? Leave the heat on for 20-30 minutes. ? Remove the heat if your skin turns bright red. This is especially important if you are unable to feel pain, heat, or cold. You may have a greater risk of getting  burned.  Soak in a warm bath. This can help relieve pain.  Take over-the-counter and prescription medicines only as told by your doctor. General instructions  Sleep on a firm mattress. Try lying on your side with your knees slightly bent. If you lie on your back, put a pillow under your knees.  Keep all follow-up visits as told by your doctor. This is important. Contact a doctor if:  You have pain that does not get better with rest or medicine. Get help right away if:  One or both of your arms or legs feel weak.  One or both of your arms or legs lose feeling (numbness).  You have trouble controlling when you poop (bowel movement) or pee (urinate).  You feel sick to your stomach (nauseous).  You throw up (vomit).  You have belly (abdominal) pain.  You have shortness of breath.  You pass out (faint). Summary  When back pain lasts longer than 3 months, it is called chronic back pain.  Pain may get worse at certain times (flare-ups).  Use ice and heat as told by your doctor. Your doctor may tell you to use ice after flare-ups. This information is not intended to replace advice given to you by your health care provider. Make sure you discuss any questions you have with your health care provider. Document Revised: 09/03/2018 Document Reviewed: 12/26/2016 Elsevier Patient Education  2020 [employer]  

## 2019-08-16 NOTE — Progress Notes (Signed)
Leg and back pain that's been hurting for the past month and worse at night

## 2019-08-16 NOTE — Progress Notes (Signed)
Patient ID: Victoria Snyder, female    DOB: Jan 26, 1966  MRN: [id number]  CC: Low back pain  Subjective: Victoria Snyder is a 54 y.o. female with history of hypertension, degenerative disc disease lumbar, low back pain with radiation, history of vertigo, anxiety and depression, bipolar, and spinal stenosis of lumbar region who presents for low back pain.  1. BACK PAIN FOLLOW-UP: Location: lower back and radiates to right hip/leg Onset: At least 10 years ago but worse since 2020 Description: 6/10 but at night worse Modifying factors: Moving makes worse. Steroid shot made it better in the past but reports the effects only last 2 months.  Symptoms Worse with: Movement Better with: Steroid injection Trauma: Denies   Red Flags Fecal/urinary incontinence: Denies Weakness: Yes Fever/chills: Chills at night sometimes. Denies fever. Night pain: Yes Unexplained weight loss: Denies No relief with bedrest: Denies. Laying down helps more. Cancer/immunosuppression: Denies IV drug use: Denies PMH of osteoporosis or chronic steroid use: Denies  Comments: Reports she has been taking Gabapentin and Ibuprofen which helps some. Zanaflex helps a lot but doesn't have muscle cramps that often. Last visit January 2021 with Dr. Chapman Fitch during that encounter patient was referred to orthopedics, Gabapentin prescribed with refills, and Ibuprofen prescribed with refills. Since last visit spoke with social worker [place] in regards to submitting documentation required for financial assistance so that referral to orthopedics can be processed. States she hasn't gotten around to completing the application yet.  2. ANXIETY AND DEPRESSION FOLLOW-UP:  Reports feeling agitated and angry but she cannot explain why.  Says that she prefers to stay to herself.  Reports arguments with her husband but would not go into detail as to what they argue about. Denies domestic related abuse. Denies thoughts of self-harm and suicidal  ideation. Reports that she is concerned that she may hurt someone else if the person makes her angry. Reports that she has not taken Lexapro in 1 week because she ran out medication. Still has Seroquel and taking as prescribed. Reports her therapist at [facility] left and she has been trying to get established with another therapist but has been unsuccessful as she says the office is not answering the phone when she calls. Reports that she has an appointment with the doctor at [facility] on 08/18/2019. Established with [facility] mental health for ongoing treatment but does agree to speak with the Education officer, museum at [employer].  GAD 7 : Generalized Anxiety Score 08/16/2019 02/24/2019 09/21/2018 07/27/2018  Nervous, Anxious, on Edge 3 2 1 2   Control/stop worrying 3 2 3 3   Worry too much - different things 2 3 3 3   Trouble relaxing 2 0 3 3  Restless 1 0 3 2  Easily annoyed or irritable 2 2 2 2   Afraid - awful might happen 3 3 1 2   Total GAD 7 Score 16 12 16 17   Anxiety Difficulty Very difficult - Somewhat difficult -   Depression screen [facility] 2/9 08/16/2019 02/24/2019 09/21/2018  Decreased Interest 3 3 3   Down, Depressed, Hopeless 2 2 3   PHQ - 2 Score 5 5 6   Altered sleeping 3 2 3   Tired, decreased energy 2 3 3   Change in appetite 1 2 1   Feeling bad or failure about yourself  3 2 2   Trouble concentrating 3 3 2   Moving slowly or fidgety/restless 3 0 2  Suicidal thoughts 2 1 0  PHQ-9 Score 22 18 19   Difficult doing work/chores Extremely dIfficult - Somewhat  difficult    Patient Active Problem List   Diagnosis Date Noted  . DDD (degenerative disc disease), lumbar 12/17/2018  . Spinal stenosis of lumbar region 12/17/2018  . Hypertension 03/31/2018  . Low back pain with radiation 03/31/2018  . History of vertigo 03/31/2018  . Anxiety and depression 03/31/2018  . Bipolar disorder ([place]) 03/31/2018     Current Outpatient Medications on File Prior to Visit  Medication Sig Dispense  Refill  . amLODipine (NORVASC) 5 MG tablet Take 1 tablet (5 mg total) by mouth daily. To lower blood pressure 90 tablet 1  . Ascorbic Acid (VITAMIN C) 1000 MG tablet Take 1,000 mg by mouth daily.    . busPIRone (BUSPAR) 15 MG tablet Take 1 tablet (15 mg total) by mouth 3 (three) times daily. 90 tablet 0  . cetirizine (ZYRTEC) 10 MG tablet Take 1 tablet (10 mg total) by mouth daily. 30 tablet 1  . diclofenac sodium (VOLTAREN) 1 % GEL Apply 4 g topically 4 (four) times daily. To help with knee pain 1 Tube 5  . escitalopram (LEXAPRO) 5 MG tablet Take 1 tablet (5 mg total) by mouth daily. 30 tablet 3  . gabapentin (NEURONTIN) 300 MG capsule Take 1 capsule (300 mg total) by mouth 3 (three) times daily. To help with back pain with radiation 90 capsule 4  . hydrOXYzine (ATARAX/VISTARIL) 10 MG tablet Take 1-3 tablets (10-30 mg total) by mouth 3 (three) times daily as needed. (Patient taking differently: Take 10-30 mg by mouth 3 (three) times daily as needed for anxiety. ) 90 tablet 0  . ibuprofen (ADVIL) 600 MG tablet Take 1 tablet (600 mg total) by mouth every 8 (eight) hours as needed for moderate pain. Eat before taking medication 60 tablet 4  . meclizine (ANTIVERT) 25 MG tablet Take 1 tablet (25 mg total) by mouth 3 (three) times daily as needed for dizziness. 30 tablet 3  . Multiple Vitamins-Minerals (MULTIVITAMIN WITH MINERALS) tablet Take 1 tablet by mouth daily.    . ondansetron (ZOFRAN) 8 MG tablet Take 1 tablet (8 mg total) by mouth every 8 (eight) hours as needed for nausea or vomiting. 20 tablet 0  . QUEtiapine (SEROQUEL) 200 MG tablet Take 200 mg by mouth at bedtime.    Marland Kitchen tiZANidine (ZANAFLEX) 4 MG tablet Take 1 tablet (4 mg total) by mouth every 8 (eight) hours as needed for muscle spasms. 60 tablet 1   No current facility-administered medications on file prior to visit.    No Known Allergies  Social History   Socioeconomic History  . Marital status: Married    Spouse name: Not on file   . Number of children: Not on file  . Years of education: Not on file  . Highest education level: Not on file  Occupational History  . Not on file  Tobacco Use  . Smoking status: Former Research scientist (life sciences)  . Smokeless tobacco: Never Used  Substance and Sexual Activity  . Alcohol use: Yes    Alcohol/week: 1.0 - 2.0 standard drinks    Types: 1 - 2 Standard drinks or equivalent per week  . Drug use: No  . Sexual activity: Yes  Other Topics Concern  . Not on file  Social History Narrative  . Not on file   Social Determinants of Health   Financial Resource Strain:   . Difficulty of Paying Living Expenses:   Food Insecurity:   . Worried About Charity fundraiser in the Last Year:   . Ran  Out of Food in the Last Year:   Transportation Needs:   . Lack of Transportation (Medical):   Marland Kitchen Lack of Transportation (Non-Medical):   Physical Activity:   . Days of Exercise per Week:   . Minutes of Exercise per Session:   Stress:   . Feeling of Stress :   Social Connections:   . Frequency of Communication with Friends and Family:   . Frequency of Social Gatherings with Friends and Family:   . Attends Religious Services:   . Active Member of Clubs or Organizations:   . Attends Archivist Meetings:   Marland Kitchen Marital Status:   Intimate Partner Violence:   . Fear of Current or Ex-Partner:   . Emotionally Abused:   Marland Kitchen Physically Abused:   . Sexually Abused:     Family History  Problem Relation Age of Onset  . Heart disease Mother        heart attack  . Stroke Father     Past Surgical History:  Procedure Laterality Date  . ABDOMINAL HYSTERECTOMY      ROS: Review of Systems Negative except as stated above  PHYSICAL EXAM: Vitals with BMI 08/16/2019 02/24/2019 01/05/2019  Height 5\' 9"  - -  Weight 155 lbs 13 oz 150 lbs -  BMI 23 - -  Systolic [id number]  Diastolic 87 79 88  Pulse 86 99 83    Physical Exam General appearance - alert, well appearing, and in no distress and oriented  to person, place, and time Mental status - alert, oriented to person, place, and time, normal speech, dress, motor activity, and thought processes, slightly flattened affect Eyes - pupils equal and reactive, extraocular eye movements intact, funduscopic exam normal, discs flat and sharp Neck - supple, no significant adenopathy Lymphatics - no palpable lymphadenopathy, no hepatosplenomegaly Chest - clear to auscultation, no wheezes, rales or rhonchi, symmetric air entry, no tachypnea, retractions or cyanosis Heart - normal rate, regular rhythm, normal S1, S2, no murmurs, rubs, clicks or gallops Back exam - limited range of motion, no tenderness,no  palpable spasm, positive straight-leg raise right leg, normal reflexes and strength bilateral lower extremities, sensory exam intact bilateral lower extremities Neurological - alert, oriented, normal speech, no focal findings or movement disorder noted, neck supple without rigidity, cranial nerves II through XII intact, funduscopic exam normal, discs flat and sharp, DTR's normal and symmetric, motor and sensory grossly normal bilaterally, normal muscle tone, no tremors, strength 5/5, Romberg sign negative, normal gait and station Musculoskeletal - no joint tenderness, deformity or swelling, abnormal active range of motion of right leg and hip, abnormal passive range of motion of right leg and hip  CMP Latest Ref Rng & Units 06/09/2019 02/24/2019 03/27/2018  Glucose 65 - 99 mg/dL 110(H) 122(H) 67  BUN 6 - 24 mg/dL 11 18 13   Creatinine 0.57 - 1.00 mg/dL 0.88 0.86 0.80  Sodium 134 - 144 mmol/L 140 142 145(H)  Potassium 3.5 - 5.2 mmol/L 4.3 3.8 4.6  Chloride 96 - 106 mmol/L 102 102 102  CO2 20 - 29 mmol/L 21 24 26   Calcium 8.7 - 10.2 mg/dL 9.8 9.9 10.3(H)  Total Protein 6.0 - 8.5 g/dL - - -  Total Bilirubin 0.0 - 1.2 mg/dL - - -  Alkaline Phos 39 - 117 IU/L - - -  AST 0 - 40 IU/L - - -  ALT 0 - 32 IU/L - - -   Lipid Panel     Component Value Date/Time  CHOL 203 (H) 10/22/2017 1106   TRIG 123 10/22/2017 1106   HDL 79 10/22/2017 1106   CHOLHDL 2.6 10/22/2017 1106   LDLCALC 99 10/22/2017 1106    CBC    Component Value Date/Time   WBC 4.9 06/09/2019 0938   WBC 5.9 12/13/2017 0533   RBC 4.69 06/09/2019 0938   RBC 5.25 (H) 12/13/2017 0533   HGB 15.1 06/09/2019 0938   HCT 43.7 06/09/2019 0938   PLT 292 06/09/2019 0938   MCV 93 06/09/2019 0938   MCH 32.2 06/09/2019 0938   MCH 31.0 12/13/2017 0533   MCHC 34.6 06/09/2019 0938   MCHC 35.1 12/13/2017 0533   RDW 12.1 06/09/2019 0938   LYMPHSABS 1.2 09/25/2013 1437   MONOABS 0.6 09/25/2013 1437   EOSABS 0.1 09/25/2013 1437   BASOSABS 0.0 09/25/2013 1437    ASSESSMENT AND PLAN: 1. Low back pain with radiation: - Ambulatory referral to Orthopedic Surgery -Complete financial assistance application -Patient with low back pain with radiation for which she has chronic issues. She has refills available for Ibuprofen, Gabapentin to assist with neuropathic pain, and Zanaflex for muscle spasms available at the pharmacy from previous refill.  Referred to orthopedics for further evaluation and treatment.  2. Primary osteoarthritis of right hip: - Ambulatory referral to Orthopedic Surgery -Refer to #1  3. Anxiety and depression:  - Ambulatory referral to Social Work -Patient with high scores on her PHQ-9 and GAD-7 screenings for depression and anxiety at today's visit.  Established at [facility] for ongoing treatment. -Behavioral Health Resources:  What if I or someone I know is in crisis?  . If you are thinking about harming yourself or having thoughts of suicide, or if you know someone who is, seek help right away.  . Call your doctor or mental health care provider.  . Call 911 or go to a hospital emergency room to get immediate help, or ask a friend or family member to help you do these things.  . Call the [place] National Suicide Prevention Lifeline's toll-free, 24-hour  hotline at 1-800-273-TALK 616 812 0727) or TTY: 1-800-799-4 TTY (320)429-5346) to talk to a trained counselor.  . If you are in crisis, make sure you are not left alone.   . If someone else is in crisis, make sure he or she is not left alone   24 Hour :   [place] National Suicide Hotline: 204 014 8298  Therapeutic Alternative Mobile Crisis: [phone]   [facility]  [address], [place], [place] [address]  [phone] or [phone]  [facility] of the [employer] (Domestic Violence, Rape & Victim Assistance)  [phone]  [place]  [address], [place]  [address]   [phone] or [phone]   [place]: [phone] (8am-4pm) or [phone] (after hours)   [facility], [address], [place], [place] Fax: [phone] [email or web address]  *Interpreters available *Accepts Medicaid, Medicare, uninsured  [facility]   Mon-Fri: 8am-5pm [address] [place], [place] [phone](phone); [phone]) [email or web address].com  *Accepts Medicare  [facility] [place], Fri: 8am-4pm [address] [place], [place] (phone); [phone] (fax) TaskTown.es  *[place] Mon-Fri: 9am-5pm  [address] [place], [place] (phone); [phone]  [email or web address]  *Accepts Medicaid  [facility] [address] [place], [place]  SalonLookup.[email or web address]   [facility]  [facility] [place] Mon-Fri, 8:30am-12pm/1pm-2:30pm [address] [place], [place] (phone); [phone] (fax) www.[email or web address]  *Accepts Medicaid,  sliding-scale*Bilingual services available  [facility] Mon-Fri, 8am-7pm [place], [place]  [phone](phone); ([phone]) www.[email or web address]  *Accepts Medicaid *Bilingual services available  [facility] Mon-Fri: 8am-5pm, Saturday by appointment only [place], [place], [place] (phone); [phone] (fax) [email or web address]   [facility] [address] [place], [place], [place] [email or web address]  *Free & reduced services for uninsured and underinsured individuals *Bilingual services for Spanish-speaking clients 21 and under  [facility], [address], [place], [place]); [phone]) RunningConvention.[email or web address]  *Bring your own interpreter at first visit *Accepts Medicare and [facility]  [place] Mon-Fri: 9am-5:30pm [address], [place], [place], [place] (phone), [phone] (fax) After hours crisis line: [phone] www.[email or web address]  *Accepts Medicare and Medicaid  [facility], 8am-6pm [address] [place], [place] (phone); [phone] (fax) http://[email or web address]  *Subsidized costs available  [facility] Mon-Fri: 8am-4pm [address] [place], [place] [phone](phone); [phone]) www.p[email or web address]  *Accepts Medicaid  [facility] Same day access hours: Mon-Fri, 8:30-3pm Crisis hours: Mon-Fri, 8am-5pm [place], [place] Same day access hours: Mon-Fri, 8:30-3pm Crisis hours: Mon-Fri, 8am-8pm [address] [place], [place] (phone); [phone] (fax) www.[email or web address]  *Accepts Medicaid and Medicare  [facility] Mon, [place], Fri:  9am-9pm Tues, Thurs: 9am-6pm [place], [place], [place] (phone); [phone] (fax) https://[email or web address]  *(Accepts Medicare and Medicaid; payment plans available)*Bilingual services available  [facility] [address], [place], [place] (phone); [phone] (fax) [email or web address] [address], [place], [place], [place]  [email or web address]  *Bilingual services available  [facility]) Mon-Thurs: 8am-8pm [address] [place], [place], [place] (phone); [phone] (fax) [email or web address]  *Accepts Medicaid*Bilingual services available  [place] [address], [place], [place], [place] (phone) DeadConnect.[email or web address]  *Accepts Medicaid *Bilingual services available  [facility] Mon-Fri, 9am-4:45pm [address], [place], [place] (phone); [phone] (fax) http://[email or web address]-[email or web address]  *Accepts Medicare  [facility] Mon-Thurs: 8:30-8pm, Fri: 8:30am-7pm [address], [place], [place] (3rd floor) [phone] (phone); [phone] (fax) VIPinterview.[email or web address]  *Accepts Medicaid; income-based reduced rates available  [facility] Mon-Fri: 8am-5pm [address] [place], [place], [place] (phone); [phone] (fax) [email or web address]://[email or web address]  *Accepts Medicaid*Bilingual services available  [place], [place], [place], [place] (phone); [phone] (fax) www.[email or web address]  *Free emergency housing and clinical services for youth in crisis  [facility] ([place])  [address] [place] [phone] www.[email or web address]  *Provides direct services to individuals in recovery from mental illness,  including support groups, recovery skills classes, and one on one peer support  [employer] [employer] on [place]) [place] helpline: (484) [phone]  https://namiguilford.org  *A community hub for information relating to local resources and services for the friends and families of individuals living alongside a mental health condition, as well as the individuals themselves. Classes and support groups also provided      Patient was given the opportunity to ask questions.  Patient verbalized understanding of the plan and was able to repeat key elements of the plan. Patient was given clear instructions to go to Emergency Department or return to medical center if symptoms don't improve, worsen, or new problems develop.The patient verbalized understanding.  No orders of  the defined types were placed in this encounter.    Requested Prescriptions    No prescriptions requested or ordered in this encounter     [place] Zachery Dauer, NP

## 2019-08-18 ENCOUNTER — Ambulatory Visit: Payer: Self-pay | Attending: Family Medicine | Admitting: Licensed Clinical Social Worker

## 2019-08-18 ENCOUNTER — Other Ambulatory Visit: Payer: Self-pay

## 2019-08-18 DIAGNOSIS — F323 Major depressive disorder, single episode, severe with psychotic features: Secondary | ICD-10-CM

## 2019-08-19 MED FILL — hydrOXYzine HCL 25 MG TABS: 25 | 30 days supply | Qty: 90 | Fill #0

## 2019-08-19 MED FILL — ESCITALOPRAM 20 MG TABLET: 20 | 30 days supply | Qty: 30 | Fill #0

## 2019-08-19 MED FILL — QUETIAPINE FUMARATE 200 MG: 200 | 30 days supply | Qty: 30 | Fill #0

## 2019-08-19 MED FILL — busPIRone HCL 15 MG TABS: 15 | 30 days supply | Qty: 60 | Fill #0

## 2019-08-20 NOTE — BH Specialist Note (Signed)
Integrated Behavioral Health Visit via Telemedicine (Telephone)  08/20/2019 JESSELIN GROSJEAN [id number]   Session Start time: 9:00am  Session End time: 9:53am Total time: 35   Referring Provider: Durene Fruits, NP Type of Visit: Telephonic Patient location: [facility] Provider location: [facility] office All persons participating in visit: Patient and MSW Intern  Confirmed patient's address: Yes  Confirmed patient's phone number: Yes  Any changes to demographics: No   Confirmed patient's insurance: Yes  Any changes to patient's insurance: No   Discussed confidentiality: Yes    The following statements were read to the patient and/or legal guardian that are established with the [facility] Provider.  "The purpose of this phone visit is to provide behavioral health care while limiting exposure to the coronavirus (COVID19).  There is a possibility of technology failure and discussed alternative modes of communication if that failure occurs."  "By engaging in this telephone visit, you consent to the provision of healthcare.  Additionally, you authorize for your insurance to be billed for the services provided during this telephone visit."   Patient and/or legal guardian consented to telephone visit: No charge due to MSW Intern providing behavioral health services  PRESENTING CONCERNS: Patient and/or family reports the following symptoms/concerns: Chronic pain, financial difficulty, and out of psychiatric medication for 1 week. Duration of problem: ongoing; Severity of problem: severe  STRENGTHS (Protective Factors/Coping Skills): Patient would like to participate in therapy Patient is participating in medication management Patient identified coping skills  GOALS ADDRESSED: Patient will: 1.  Reduce symptoms of: anxiety, depression and stress  2.  Increase knowledge and/or ability of: coping skills, healthy habits and self-management skills  3.  Demonstrate ability to: Increase  healthy adjustment to current life circumstances and Increase adequate support systems for patient/family  INTERVENTIONS: Interventions utilized:  Solution-Focused Strategies, Supportive Counseling, Medication Monitoring and Link to [employer] Standardized Assessments completed: Not Needed  ASSESSMENT: Patient currently experiencing symptoms of anxiety and depression marked by low mood, feeling "depressed, angry, and off balance" and interpersonal conflict. Patient receives medication management at [facility]. Patient states she ran out of her medication 1 week ago. Patient has a psychiatry appointment tomorrow (08/19/19) to address medication management. Patient was receiving counseling services at [facility] with therapist, Vito Backers; however, her therapist no longer works at [employer]. Patient was told she would be contacted to begin therapy with another counselor through [place] but has not been contacted yet. Patient has also experienced 3 significant losses in the past few months. Patient expressed interest in bereavement counseling with [facility] and requested support to re-initiate counseling Services at [facility]. Patient endorses vague suicidal ideation; however denies plan and denies intent. Crisis resources were provided and coping skills were discussed.  Patient may benefit from continued medication management and counseling services with [facility]. Patient may need additional support to secure counseling appointment at [facility]. This MSW Intern placed call to [facility] and left message requesting patient call-back to schedule a counseling appointment.  PLAN: 1. Follow up with behavioral health clinician on : 08/26/19 2. Behavioral recommendations: This MSW Intern encouraged patient to attend psychiatry appointment, verbalize how she is feeling to her psychiatrist, and to utilize coping skills discussed in session. 3. Referral(s): [place] (In Clinic) and [place] [facility] (LME/Outside Clinic) [place] and [place]  MSW Intern 08/20/19 3:40pm

## 2019-08-26 ENCOUNTER — Other Ambulatory Visit: Payer: Self-pay

## 2019-08-26 ENCOUNTER — Ambulatory Visit: Payer: Self-pay | Attending: Family Medicine | Admitting: Licensed Clinical Social Worker

## 2019-08-26 DIAGNOSIS — F323 Major depressive disorder, single episode, severe with psychotic features: Secondary | ICD-10-CM

## 2019-09-01 NOTE — BH Specialist Note (Signed)
Integrated Behavioral Health Follow Up Visit  MRN: [id number] Name: Victoria Snyder  Number of [place] Clinician visits: 2/6 Session Start time: 9:10am  Session End time: 10:10am Total time: 60  Type of Service: [place] Interpretor:No. Interpretor Name and Language: n/a  SUBJECTIVE: Victoria Snyder is a 54 y.o. female accompanied by self Patient was referred by NP Minette Brine for symptoms of anxiety and depression. Patient reports the following symptoms/concerns: difficulty managing mental health conditions Duration of problem: ongoing; Severity of problem: severe  OBJECTIVE: Mood: Depressed and Affect: Appropriate Risk of harm to self or others: No plan to harm self or others, patient reports vague suicidal ideation, denies plan, denies intent  LIFE CONTEXT: Family and Social: Patient resides in home with husband. Patient has adult children School/Work: Patient is unable to work due to physical and behavioral health conditions Self-Care: Patient participates in medication management at [facility] and is scheduled for an upcoming therapy appointment. Patient spends time alone to manage behavioral health symptoms Life Changes: Patient's therapist left [facility]. Patient is scheduled for a therapy appointment with a new clinician next month. Patient also experienced a recent lapse in her antidepressant medication she is prescribed via [facility].  GOALS ADDRESSED: Patient will: 1.  Reduce symptoms of: anxiety and depression  2.  Increase knowledge and/or ability of: coping skills and healthy habits  3.  Demonstrate ability to: Increase healthy adjustment to current life circumstances and Increase adequate support systems for patient/family  INTERVENTIONS: Interventions utilized:  Solution-Focused Strategies, Behavioral Activation, Supportive Counseling and Medication Monitoring Standardized Assessments completed: Not  Needed  ASSESSMENT: Patient currently experiencing ongoing symptoms of depression marked by feeling overwhelmed by small tasks, feeling down, and trouble concentrating. Patient attended a recent appointment with psychiatrist at [facility] who refilled patient's medications. Patient is scheduled to meet with a therapist at [facility] beginning next month. Patient has experienced three significant losses in the past 3 months. MSW Intern provided emotional support and discussed coping skills to manage symptoms of depression and anxiety with patient.    Patient may benefit from follow up with [facility] for therapy and medication management and utilization of coping skills discussed in session.  PLAN: 1. Follow up with behavioral health clinician on: Patient was encouraged to contact Victoria See, LCSW if additional support is needed. 2. Behavioral recommendations: Use coping skills discussed in session, collect necessary documentation to apply for [facility] Financial Assistance Program. 3. Referral(s): [place] (LME/Outside Clinic) 4. "From scale of 1-10, how likely are you to follow plan?":   Berniece Salines  MSW Intern 09/01/19 3:48pm

## 2019-10-18 MED FILL — AMLODIPINE BESYLATE 5 MG TA: 5 | 30 days supply | Qty: 30 | Fill #2

## 2019-10-19 MED FILL — ESCITALOPRAM 20 MG TABLET: 20 | 30 days supply | Qty: 30 | Fill #1

## 2019-11-01 ENCOUNTER — Ambulatory Visit (HOSPITAL_COMMUNITY): Payer: Medicaid Other | Admitting: Clinical

## 2019-11-08 ENCOUNTER — Ambulatory Visit (INDEPENDENT_AMBULATORY_CARE_PROVIDER_SITE_OTHER): Payer: No Payment, Other | Admitting: Clinical

## 2019-11-08 DIAGNOSIS — F3130 Bipolar disorder, current episode depressed, mild or moderate severity, unspecified: Secondary | ICD-10-CM | POA: Diagnosis not present
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
## 2019-11-30 ENCOUNTER — Ambulatory Visit (INDEPENDENT_AMBULATORY_CARE_PROVIDER_SITE_OTHER): Payer: No Payment, Other | Admitting: Clinical

## 2019-11-30 ENCOUNTER — Other Ambulatory Visit: Payer: Self-pay

## 2019-11-30 DIAGNOSIS — F3130 Bipolar disorder, current episode depressed, mild or moderate severity, unspecified: Secondary | ICD-10-CM
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
## 2019-12-08 ENCOUNTER — Ambulatory Visit (INDEPENDENT_AMBULATORY_CARE_PROVIDER_SITE_OTHER): Payer: No Payment, Other | Admitting: Psychiatry

## 2019-12-08 ENCOUNTER — Other Ambulatory Visit: Payer: Self-pay

## 2019-12-08 ENCOUNTER — Encounter (HOSPITAL_COMMUNITY): Payer: Self-pay | Admitting: Psychiatry

## 2019-12-08 DIAGNOSIS — F419 Anxiety disorder, unspecified: Secondary | ICD-10-CM | POA: Diagnosis not present

## 2019-12-08 DIAGNOSIS — F314 Bipolar disorder, current episode depressed, severe, without psychotic features: Secondary | ICD-10-CM | POA: Diagnosis not present

## 2019-12-08 DIAGNOSIS — F32A Depression, unspecified: Secondary | ICD-10-CM

## 2019-12-08 DIAGNOSIS — F329 Major depressive disorder, single episode, unspecified: Secondary | ICD-10-CM

## 2019-12-08 MED ORDER — HYDROXYZINE HCL 10 MG PO TABS: 10.0000 mg | ORAL_TABLET | Freq: Three times a day (TID) | ORAL | 1 refills | Status: DC | PRN

## 2019-12-08 MED ORDER — BUSPIRONE HCL 15 MG PO TABS: 15.0000 mg | ORAL_TABLET | Freq: Three times a day (TID) | ORAL | 1 refills | Status: DC

## 2019-12-08 MED ORDER — QUETIAPINE FUMARATE 200 MG PO TABS: 200.0000 mg | ORAL_TABLET | Freq: Every day | ORAL | 1 refills | Status: DC

## 2019-12-08 MED ORDER — ESCITALOPRAM OXALATE 10 MG PO TABS: 10.0000 mg | ORAL_TABLET | Freq: Every day | ORAL | 1 refills | Status: DC

## 2019-12-08 MED FILL — AMLODIPINE BESYLATE 5 MG TA: 5 | 30 days supply | Qty: 30 | Fill #3

## 2019-12-08 MED FILL — hydrOXYzine HCL 10 MG TABS: 10 | 30 days supply | Qty: 90 | Fill #0

## 2019-12-08 MED FILL — busPIRone HCL 15 MG TABS: 15 | 30 days supply | Qty: 90 | Fill #0

## 2019-12-08 MED FILL — QUETIAPINE FUMARATE 200 MG: 200 | 30 days supply | Qty: 30 | Fill #0

## 2019-12-08 MED FILL — ESCITALOPRAM 20 MG TABLET: 20 | 30 days supply | Qty: 30 | Fill #2

## 2019-12-08 NOTE — Progress Notes (Signed)
Psychiatric Initial Adult Assessment   Patient Identification: Victoria Snyder MRN:  [id number] Date of Evaluation:  12/08/2019 Referral Source: Beverly Sessions  Chief Complaint:  54 year old female seen today for initial psychiatric evaluation.  She was referred to outpatient psychiatry by [facility] for medication management.  She has a psychiatric history of anxiety, depression, insomnia, and bipolar 2 disorder.  She is currently managed on Seroquel 200 mg at bedtime, hydroxyzine 10 mg 3 times daily as needed, BuSpar 15 mg three times daily, Lexapro 5 mg daily, and gabapentin 300 TID (for back pain).   Today patient informed writer that she was not feeling well.  She states that she feels a little dizzy and notes that she has not taken her medication in a few days because she ran out. Patient has a history of vertigo and is followed by a PCP.  Writer encouraged patient to rest and stay hydrated.  She endorsed understanding and agreed.   Patient endorses depressive symptoms such as disrupted sleep, fatigue, difficulty concentrating, impaired memory (forgets to take medications), thoughts of death, and anxiety.  At any time she is distractible has fluctuating moods, racing thoughts, hallucinations (describing them as static voives) and impulsive behaviors.  She notes that Seroquel has helped quiet her voices however notes that it causes her to feel sleepy.  She informed Probation officer that she sometimes takes Seroquel in the daytime.  Provider informed patient that Seroquel is sedating and she should take it at night to help with sleep as well as her mood.    She notes that she becomes really irritable with her husband and notes he describes her irritability is acting out.  She notes at times she says things to him that she regrets.  She notes that she stopped using marijuana 5 months ago because her husband informed her that it caused her to be irrational.  She notes at times she excessively worries about life stressors  and her grandchildren.   Patient is agreeable to increasing Lexapro 5 mg to 10 mg to help with symptoms of depression. Potential side effects of medication and risks vs benefits of treatment vs non-treatment were explained and discussed. All questions were answered. She will continue all other medications as prescribed.  Patient notes that she is followed by outpatient counselor for therapy which she reports has been effective.  No other concerns noted at this time. Visit Diagnosis:    ICD-10-CM   1. Bipolar disorder, current episode depressed, severe, without psychotic features ([place])  F31.4 QUEtiapine (SEROQUEL) 200 MG tablet  2. Anxiety and depression  F41.9 busPIRone (BUSPAR) 15 MG tablet   F32.9 escitalopram (LEXAPRO) 10 MG tablet    hydrOXYzine (ATARAX/VISTARIL) 10 MG tablet    History of Present Illness:  anxiety, depression, insomnia, and bipolar 2 disorder. Associated Signs/Symptoms: Depression Symptoms:  depressed mood, insomnia, fatigue, difficulty concentrating, recurrent thoughts of death, anxiety, loss of energy/fatigue, disturbed sleep, (Hypo) Manic Symptoms:  Distractibility, Elevated Mood, Flight of Ideas, Hallucinations, Impulsivity, Irritable Mood, Anxiety Symptoms:  Excessive Worry, Psychotic Symptoms:  Hallucinations: Auditory PTSD Symptoms: Had a traumatic exposure:  notes she a hard time when her mother died. She also reports that her 31 year old grand child almost drowned which increases her stress level  Past Psychiatric History: Bipolar 2 , anxiety, depression, and insomnia   Previous Psychotropic Medications: Yes   Substance Abuse History in the last 12 months:  Yes.    Consequences of Substance Abuse: NA  Past Medical History:  Past Medical History:  Diagnosis Date  . Bipolar disorder ([place])   . Blood transfusion without reported diagnosis   . Hypertension   . Hypokalemia   . Low back pain with radiation   . Vertigo     Past Surgical  History:  Procedure Laterality Date  . ABDOMINAL HYSTERECTOMY      Family Psychiatric History: Notes that her half brother had a mental illness however she is unaware of what it was.   Family History:  Family History  Problem Relation Age of Onset  . Heart disease Mother        heart attack  . Stroke Father     Social History:   Social History   Socioeconomic History  . Marital status: Married    Spouse name: Not on file  . Number of children: Not on file  . Years of education: Not on file  . Highest education level: Not on file  Occupational History  . Not on file  Tobacco Use  . Smoking status: Former Research scientist (life sciences)  . Smokeless tobacco: Never Used  Vaping Use  . Vaping Use: Never used  Substance and Sexual Activity  . Alcohol use: Yes    Alcohol/week: 1.0 - 2.0 standard drink    Types: 1 - 2 Standard drinks or equivalent per week  . Drug use: No  . Sexual activity: Yes  Other Topics Concern  . Not on file  Social History Narrative  . Not on file   Social Determinants of Health   Financial Resource Strain:   . Difficulty of Paying Living Expenses:   Food Insecurity:   . Worried About Charity fundraiser in the Last Year:   . Arboriculturist in the Last Year:   Transportation Needs:   . Film/video editor (Medical):   Marland Kitchen Lack of Transportation (Non-Medical):   Physical Activity:   . Days of Exercise per Week:   . Minutes of Exercise per Session:   Stress:   . Feeling of Stress :   Social Connections:   . Frequency of Communication with Friends and Family:   . Frequency of Social Gatherings with Friends and Family:   . Attends Religious Services:   . Active Member of Clubs or Organizations:   . Attends Archivist Meetings:   Marland Kitchen Marital Status:     Additional Social History: Patient resides in [place] with her husband.  She has 1 biological child and 3 stepchildren.  She was not using marijuana in over 5 months.  She denies alcohol and tobacco  use.  Allergies:  No Known Allergies  Metabolic Disorder Labs: Lab Results  Component Value Date   HGBA1C 4.9 06/09/2019   No results found for: PROLACTIN Lab Results  Component Value Date   CHOL 203 (H) 10/22/2017   TRIG 123 10/22/2017   HDL 79 10/22/2017   CHOLHDL 2.6 10/22/2017   LDLCALC 99 10/22/2017   Lab Results  Component Value Date   TSH 1.840 10/22/2017    Therapeutic Level Labs: No results found for: LITHIUM No results found for: CBMZ No results found for: VALPROATE  Current Medications: Current Outpatient Medications  Medication Sig Dispense Refill  . amLODipine (NORVASC) 5 MG tablet Take 1 tablet (5 mg total) by mouth daily. To lower blood pressure 90 tablet 1  . Ascorbic Acid (VITAMIN C) 1000 MG tablet Take 1,000 mg by mouth daily.    . busPIRone (BUSPAR) 15 MG tablet Take 1 tablet (15 mg total) by mouth 3 (  three) times daily. 90 tablet 1  . cetirizine (ZYRTEC) 10 MG tablet Take 1 tablet (10 mg total) by mouth daily. 30 tablet 1  . diclofenac sodium (VOLTAREN) 1 % GEL Apply 4 g topically 4 (four) times daily. To help with knee pain 1 Tube 5  . escitalopram (LEXAPRO) 10 MG tablet Take 1 tablet (10 mg total) by mouth daily. 30 tablet 1  . gabapentin (NEURONTIN) 300 MG capsule Take 1 capsule (300 mg total) by mouth 3 (three) times daily. To help with back pain with radiation 90 capsule 4  . hydrOXYzine (ATARAX/VISTARIL) 10 MG tablet Take 1 tablet (10 mg total) by mouth 3 (three) times daily as needed for anxiety. 90 tablet 1  . ibuprofen (ADVIL) 600 MG tablet Take 1 tablet (600 mg total) by mouth every 8 (eight) hours as needed for moderate pain. Eat before taking medication 60 tablet 4  . meclizine (ANTIVERT) 25 MG tablet Take 1 tablet (25 mg total) by mouth 3 (three) times daily as needed for dizziness. 30 tablet 3  . Multiple Vitamins-Minerals (MULTIVITAMIN WITH MINERALS) tablet Take 1 tablet by mouth daily.    . ondansetron (ZOFRAN) 8 MG tablet Take 1 tablet (8  mg total) by mouth every 8 (eight) hours as needed for nausea or vomiting. 20 tablet 0  . QUEtiapine (SEROQUEL) 200 MG tablet Take 1 tablet (200 mg total) by mouth at bedtime. 30 tablet 1  . tiZANidine (ZANAFLEX) 4 MG tablet Take 1 tablet (4 mg total) by mouth every 8 (eight) hours as needed for muscle spasms. 60 tablet 1   No current facility-administered medications for this visit.    Musculoskeletal: Strength & Muscle Tone: within normal limits Gait & Station: normal Patient leans: N/A  Psychiatric Specialty Exam: Review of Systems  There were no vitals taken for this visit.There is no height or weight on file to calculate BMI.  General Appearance: Well Groomed  Eye Contact:  Good  Speech:  Clear and Coherent and Slow  Volume:  Decreased  Mood:  Depressed  Affect:  Congruent  Thought Process:  Coherent, Goal Directed and Linear  Orientation:  Full (Time, Place, and Person)  Thought Content:  Logical and Hallucinations: Auditory  Suicidal Thoughts:  Yes.  without intent/plan  Homicidal Thoughts:  No  Memory:  Immediate;   Fair Recent;   Fair Remote;   Fair  Judgement:  Good  Insight:  Good  Psychomotor Activity:  Normal  Concentration:  Concentration: Fair and Attention Span: Fair  Recall:  Good  Fund of Knowledge:Good  Language: Good  Akathisia:  No  Handed:  Right  AIMS (if indicated):  Not done  Assets:  Communication Skills Desire for Improvement Financial Resources/Insurance Housing Social Support  ADL's:  Intact  Cognition: WNL  Sleep:  Fair   Screenings: GAD-7     Counselor from 11/08/2019 in [facility] Office Visit from 08/16/2019 in [facility] Office Visit from 02/24/2019 in [facility] Office Visit from 09/21/2018 in [facility] Office Visit from 07/27/2018 in [facility]  Total GAD-7 Score 17 16 12 16 17      PHQ2-9     Counselor from 11/08/2019 in [facility] Office Visit from 08/16/2019 in [facility] Office Visit from 02/24/2019 in [facility] Office Visit from 09/21/2018 in [facility] Office Visit from 07/27/2018 in [facility]  PHQ-2 Total Score 6 5 5 6 5   PHQ-9 Total Score 15 22 18 19 18       Assessment and Plan: Auditory hallucinations new symptoms of depression.  She is agreeable to increase Lexapro 5mg  to 10 mg.  She will continue her other medications as prescribed.  1. Bipolar disorder, current episode depressed, severe, without psychotic features ([place])  Continue- QUEtiapine (SEROQUEL) 200 MG tablet; Take 1 tablet (200 mg total) by mouth at bedtime.  Dispense: 30 tablet; Refill: 1  2. Anxiety and depression  Continue- busPIRone (BUSPAR) 15 MG tablet; Take 1 tablet (15 mg total) by mouth 3 (three) times daily.  Dispense: 90 tablet; Refill: 1 Increased- escitalopram (LEXAPRO) 10 MG tablet; Take 1 tablet (10 mg total) by mouth daily.  Dispense: 30 tablet; Refill: 1 Continue- hydrOXYzine (ATARAX/VISTARIL) 10 MG tablet; Take 1 tablet (10 mg total) by mouth 3 (three) times daily as needed for anxiety.  Dispense: 90 tablet; Refill: 1  Follow-up in1 month Follow-up with therapy   Salley [place], NP 7/14/20213:31 PM

## 2019-12-21 ENCOUNTER — Ambulatory Visit (INDEPENDENT_AMBULATORY_CARE_PROVIDER_SITE_OTHER): Payer: No Payment, Other | Admitting: Clinical

## 2019-12-21 ENCOUNTER — Other Ambulatory Visit: Payer: Self-pay

## 2019-12-21 DIAGNOSIS — F314 Bipolar disorder, current episode depressed, severe, without psychotic features: Secondary | ICD-10-CM

## 2019-12-21 NOTE — Progress Notes (Signed)
   THERAPIST PROGRESS NOTE  Session Time: 22 minutes  Participation Level: Active  Behavioral Response: CasualAlertEuthymic  Type of Therapy: Individual Therapy  Treatment Goals addressed: Anxiety and Diagnosis: Depression  Interventions: CBT  Summary:  Victoria Snyder is a 54 y.o. female who presents in person for the scheduled appointment. Client presented oriented times five, appropriately dressed, and friendly. Client denied hallucinations and delusions. Client reported on today she is doing better than she was last time she appeared for session. Client reported at the last session she was out of her medication for two weeks. Client reported she went through a depressive phase after her last session due to being out of medication until she saw the doctor again.  Client reported when she is depressed her husband prompts her to get up and take a shower or get dressed. Client reported her sister frequently checks on her by phone call or comes to the house to help the client get up and going. Client discussed her current stressors include her marriage describing she keeps to herself in the household but they discuss things that pertain to the house. Client also stated, "sometimes I feel a lot of anger/ resentment" that she keeps to herself. Client reported she notices that she questions herself a lot about choices and where she is in life currently. Client reported she is not ready to discuss the people and situations from which those feelings come from. Client reported she has noticed improvement in her mood since having her medication adjusted. Client reported on today she has the goal to work on folding and putting away her laundry that has been sitting for a few weeks.   Client reviewed the previous sessions homework with the therapist and reported she will complete the journaling assignment starting this week because she has a journal as of today.      Suicidal/Homicidal: Nowithout  intent/plan  Therapist Response:  Therapist began the session by checking in and asking the client how she is feeling. Therapist allowed time to focus on the clients thoughts and feelings. Therapist used active listening to continue building a therapeutic relationship with the client. Therapist used open ended questions to probe about what the clients stressors are and how she is managing them. Therapist engaged the client in a open discussion to discuss monitoring her self care and how she can continue to improve medication compliance, hygiene, and behavioral activation activities. Therapist assigned the client homework to utilize her journal to write down thoughts that instigate her anger and/ or feelings of resentment that she does not discuss with anyone. Therapist assisted with scheduling the client for next appointment.    Plan: Return again in 3 weeks for individual therapy.  Diagnosis: Bipolar disorder, current or most recent episode depressed, severe, without psychotic features    Victoria Jubilee Batool Majid, LCSW 12/21/2019

## 2020-01-04 ENCOUNTER — Ambulatory Visit: Payer: Self-pay | Attending: Family | Admitting: Family

## 2020-01-04 ENCOUNTER — Other Ambulatory Visit: Payer: Self-pay

## 2020-01-04 DIAGNOSIS — M545 Low back pain, unspecified: Secondary | ICD-10-CM

## 2020-01-04 DIAGNOSIS — M1611 Unilateral primary osteoarthritis, right hip: Secondary | ICD-10-CM

## 2020-01-04 MED ORDER — TIZANIDINE HCL 4 MG PO TABS: 4.0000 mg | ORAL_TABLET | Freq: Three times a day (TID) | ORAL | 1 refills | Status: AC | PRN

## 2020-01-04 MED ORDER — GABAPENTIN 300 MG PO CAPS: 300.0000 mg | ORAL_CAPSULE | Freq: Three times a day (TID) | ORAL | 3 refills | Status: DC

## 2020-01-04 MED FILL — GABAPENTIN 300 MG CAPSULE: 300 | 30 days supply | Qty: 90 | Fill #0

## 2020-01-04 MED FILL — tiZANidine HCL 4 MG TABS: 4 | 20 days supply | Qty: 60 | Fill #0

## 2020-01-04 NOTE — Progress Notes (Signed)
Virtual Visit via Telephone Note  I connected with Victoria Snyder, on 01/04/2020 at 10:03 AM by telephone due to the COVID-19 pandemic and verified that I am speaking with the correct person using two identifiers.  Due to current restrictions/limitations of in-office visits due to the COVID-19 pandemic, this scheduled clinical appointment was converted to a telehealth visit.   Consent: I discussed the limitations, risks, security and privacy concerns of performing an evaluation and management service by telephone and the availability of in person appointments. I also discussed with the patient that there may be a patient responsible charge related to this service. The patient expressed understanding and agreed to proceed.  Location of Patient: Home  Location of Provider: [facility]  Persons participating in Telemedicine visit: Victoria Kuehnel, NP Orlan Leavens, CMA  History of Present Illness: Victoria Snyder is a 54 year old female with history of hypertension, degenerative disc disease lumbar, low back pain with radiation, history of vertigo, anxiety and depression, bipolar disorder, and spinal stenosis of lumbar region who presents today for back pain follow-up.  1. BACK PAIN FOLLOW-UP: Location: middle lower back Onset: months Description: throbbing pain 8/10, comes and goes, worse at night  Radiation: right hip and right leg  Symptoms Worse with: sitting for long periods of time Better with: stretching, heating pads Trauma: denies Popping/clicking sounds with movement/bending: yes  Muscle spasms: sometimes   Red Flags Fecal/urinary incontinence: denies  Night pain: yes  No relief with bedrest: sometimes  Last visit 08/16/2019. During that encounter referred to Orthopedic Surgery. Counseled to complete [place] financial discount/orange card discount. Continued on Ibuprofen, Gabapentin, and Zanaflex.   Today reports she is unable  to receive [place] financial assistance/orange card discount because she has not submitted required documentation. States she will try to get this submitted soon. As a result she has be unable to see Orthopedics. Reports Ibuprofen doesn't help. Reports Gabapentin and Zanaflex help some.    Past Medical History:  Diagnosis Date  . Bipolar disorder ([place])   . Blood transfusion without reported diagnosis   . Hypertension   . Hypokalemia   . Low back pain with radiation   . Vertigo    No Known Allergies  Current Outpatient Medications on File Prior to Visit  Medication Sig Dispense Refill  . amLODipine (NORVASC) 5 MG tablet Take 1 tablet (5 mg total) by mouth daily. To lower blood pressure 90 tablet 1  . Ascorbic Acid (VITAMIN C) 1000 MG tablet Take 1,000 mg by mouth daily.    . busPIRone (BUSPAR) 15 MG tablet Take 1 tablet (15 mg total) by mouth 3 (three) times daily. 90 tablet 1  . cetirizine (ZYRTEC) 10 MG tablet Take 1 tablet (10 mg total) by mouth daily. 30 tablet 1  . diclofenac sodium (VOLTAREN) 1 % GEL Apply 4 g topically 4 (four) times daily. To help with knee pain 1 Tube 5  . escitalopram (LEXAPRO) 10 MG tablet Take 1 tablet (10 mg total) by mouth daily. 30 tablet 1  . gabapentin (NEURONTIN) 300 MG capsule Take 1 capsule (300 mg total) by mouth 3 (three) times daily. To help with back pain with radiation 90 capsule 4  . hydrOXYzine (ATARAX/VISTARIL) 10 MG tablet Take 1 tablet (10 mg total) by mouth 3 (three) times daily as needed for anxiety. 90 tablet 1  . ibuprofen (ADVIL) 600 MG tablet Take 1 tablet (600 mg total) by mouth every 8 (eight) hours as needed  for moderate pain. Eat before taking medication 60 tablet 4  . meclizine (ANTIVERT) 25 MG tablet Take 1 tablet (25 mg total) by mouth 3 (three) times daily as needed for dizziness. 30 tablet 3  . Multiple Vitamins-Minerals (MULTIVITAMIN WITH MINERALS) tablet Take 1 tablet by mouth daily.    . ondansetron (ZOFRAN) 8 MG tablet  Take 1 tablet (8 mg total) by mouth every 8 (eight) hours as needed for nausea or vomiting. 20 tablet 0  . QUEtiapine (SEROQUEL) 200 MG tablet Take 1 tablet (200 mg total) by mouth at bedtime. 30 tablet 1  . tiZANidine (ZANAFLEX) 4 MG tablet Take 1 tablet (4 mg total) by mouth every 8 (eight) hours as needed for muscle spasms. 60 tablet 1   No current facility-administered medications on file prior to visit.    Observations/Objective: Alert and oriented x 3. Not in acute distress. Physical examination not completed as this is a telemedicine visit.  Assessment and Plan: 1. Low back pain with radiation: 2. Primary osteoarthritis of right hip: - Patient with chronic low back pain with radiation and primary osteoarthritis of right hip. - Stable.  - Continue Gabapentin as prescribed to help with back pain radiation.  - Continue Tizanidine asprescribed for muscle spasm. Tizanidine is a muscle relaxant which may cause drowsiness. C ounseled patient to not consume if operating heavy machinery or driving. Counseled patient to not consume with alcohol. - Patient reports Ibuprofen does not help. Instead patient may use Diclofenac Sodium (Voltaren) 1% gel 4 times daily. Refills available on file with patient's pharmacy.  - Counseled patient that Gabapentin, Tizanidine, and Diclofenac gel may not diminish all pain but that the combination of these medications may provide some relief. Patient verbalized understanding.  - Referral to Orthopedics pending approval of [place] financial discount/orange card discount - Encouraged patient to submit [place] discount/orange card discount. Patient reports she will do this soon.  - Follow up with primary physician in 4 months or sooner if needed. - gabapentin (NEURONTIN) 300 MG capsule; Take 1 capsule (300 mg total) by mouth 3 (three) times daily. To help with back pain with radiation  Dispense: 90 capsule; Refill: 3 - tiZANidine (ZANAFLEX) 4 MG  tablet; Take 1 tablet (4 mg total) by mouth every 8 (eight) hours as needed for muscle spasms.  Dispense: 60 tablet; Refill: 1   Follow Up Instructions: Follow-up with primary physician in 4 months or sooner if needed.   Patient was given clear instructions to go to Emergency Department or return to medical center if symptoms don't improve, worsen, or new problems develop.The patient verbalized understanding.  I discussed the assessment and treatment plan with the patient. The patient was provided an opportunity to ask questions and all were answered. The patient agreed with the plan and demonstrated an understanding of the instructions.   The patient was advised to call back or seek an in-person evaluation if the symptoms worsen or if the condition fails to improve as anticipated.   I provided 12 minutes total of non-face-to-face time during this encounter including median intraservice time, reviewing previous notes, labs, imaging, medications, management and patient verbalized understanding.    Camillia Herter, NP  [facility] [place], [place]   01/04/2020, 7:33 AM

## 2020-01-04 NOTE — Patient Instructions (Signed)
Continue Gabapentin, Zanaflex, Diclofenac gel for low back pain and right hip pain. Referral to Orthopedics pending approval of [employer] financial discount/orange card discount. Follow-up with primary physician in 4 months or sooner if needed. Chronic Back Pain When back pain lasts longer than 3 months, it is called chronic back pain. Pain may get worse at certain times (flare-ups). There are things you can do at home to manage your pain. Follow these instructions at home: Activity      Avoid bending and other activities that make pain worse.  When standing: ? Keep your upper back and neck straight. ? Keep your shoulders pulled back. ? Avoid slouching.  When sitting: ? Keep your back straight. ? Relax your shoulders. Do not round your shoulders or pull them backward.  Do not sit or stand in one place for long periods of time.  Take short rest breaks during the day. Lying down or standing is usually better than sitting. Resting can help relieve pain.  When sitting or lying down for a long time, do some mild activity or stretching. This will help to prevent stiffness and pain.  Get regular exercise. Ask your doctor what activities are safe for you.  Do not lift anything that is heavier than 10 lb (4.5 kg). To prevent injury when you lift things: ? Bend your knees. ? Keep the weight close to your body. ? Avoid twisting. Managing pain  If told, put ice on the painful area. Your doctor may tell you to use ice for 24-48 hours after a flare-up starts. ? Put ice in a plastic bag. ? Place a towel between your skin and the bag. ? Leave the ice on for 20 minutes, 2-3 times a day.  If told, put heat on the painful area as often as told by your doctor. Use the heat source that your doctor recommends, such as a moist heat pack or a heating pad. ? Place a towel between your skin and the heat source. ? Leave the heat on for 20-30 minutes. ? Remove the heat if your skin turns bright red.  This is especially important if you are unable to feel pain, heat, or cold. You may have a greater risk of getting burned.  Soak in a warm bath. This can help relieve pain.  Take over-the-counter and prescription medicines only as told by your doctor. General instructions  Sleep on a firm mattress. Try lying on your side with your knees slightly bent. If you lie on your back, put a pillow under your knees.  Keep all follow-up visits as told by your doctor. This is important. Contact a doctor if:  You have pain that does not get better with rest or medicine. Get help right away if:  One or both of your arms or legs feel weak.  One or both of your arms or legs lose feeling (numbness).  You have trouble controlling when you poop (bowel movement) or pee (urinate).  You feel sick to your stomach (nauseous).  You throw up (vomit).  You have belly (abdominal) pain.  You have shortness of breath.  You pass out (faint). Summary  When back pain lasts longer than 3 months, it is called chronic back pain.  Pain may get worse at certain times (flare-ups).  Use ice and heat as told by your doctor. Your doctor may tell you to use ice after flare-ups. This information is not intended to replace advice given to you by your health care provider. Make  sure you discuss any questions you have with your health care provider. Document Revised: 09/03/2018 Document Reviewed: 12/26/2016 Elsevier Patient Education  2020 [employer].

## 2020-01-12 ENCOUNTER — Ambulatory Visit (INDEPENDENT_AMBULATORY_CARE_PROVIDER_SITE_OTHER): Payer: No Payment, Other | Admitting: Psychiatry

## 2020-01-12 ENCOUNTER — Encounter (HOSPITAL_COMMUNITY): Payer: Self-pay | Admitting: Psychiatry

## 2020-01-12 ENCOUNTER — Other Ambulatory Visit: Payer: Self-pay

## 2020-01-12 DIAGNOSIS — F329 Major depressive disorder, single episode, unspecified: Secondary | ICD-10-CM | POA: Diagnosis not present

## 2020-01-12 DIAGNOSIS — F419 Anxiety disorder, unspecified: Secondary | ICD-10-CM | POA: Diagnosis not present

## 2020-01-12 DIAGNOSIS — F314 Bipolar disorder, current episode depressed, severe, without psychotic features: Secondary | ICD-10-CM | POA: Diagnosis not present

## 2020-01-12 DIAGNOSIS — F32A Depression, unspecified: Secondary | ICD-10-CM

## 2020-01-12 MED ORDER — QUETIAPINE FUMARATE 200 MG PO TABS: 200.0000 mg | ORAL_TABLET | Freq: Every day | ORAL | 1 refills | Status: DC

## 2020-01-12 MED ORDER — DULOXETINE HCL 20 MG PO CPEP: 20.0000 mg | ORAL_CAPSULE | Freq: Two times a day (BID) | ORAL | 2 refills | Status: DC

## 2020-01-12 MED ORDER — TRAZODONE HCL 50 MG PO TABS: 50.0000 mg | ORAL_TABLET | Freq: Every day | ORAL | 2 refills | Status: DC

## 2020-01-12 MED ORDER — HYDROXYZINE HCL 10 MG PO TABS: 10.0000 mg | ORAL_TABLET | Freq: Three times a day (TID) | ORAL | 1 refills | Status: DC | PRN

## 2020-01-12 MED ORDER — BUSPIRONE HCL 15 MG PO TABS: 15.0000 mg | ORAL_TABLET | Freq: Three times a day (TID) | ORAL | 1 refills | Status: DC

## 2020-01-12 MED FILL — traZODone HCL 50 MG TABS: 50 | 30 days supply | Qty: 30 | Fill #0

## 2020-01-12 MED FILL — QUETIAPINE FUMARATE 200 MG: 200 | 30 days supply | Qty: 30 | Fill #0

## 2020-01-12 MED FILL — hydrOXYzine HCL 10 MG TABS: 10 | 30 days supply | Qty: 90 | Fill #0

## 2020-01-12 MED FILL — DULoxetine HCL 20 MG CPEP: 20 | 30 days supply | Qty: 60 | Fill #0

## 2020-01-12 NOTE — Progress Notes (Signed)
BH MD/PA/NP OP Progress Note  01/12/2020 10:47 AM Victoria Snyder  MRN:  [id number]  Chief Complaint:  "I am not feeling good today. I am more depressed because my back and hip are hurting."  HPI: 54 year old female seen today for follow up psychiatric evaluation. She has a  Psychiatric history of Bipolar 2 with psychotic features, anxiety, and depression. She is currently being managed on Seroquel 200 mg daily, Lexapro 10 mg daily, Buspar 15 mg three times daily, and Hydroxyzine 10 mg three times daily as needed.  Today patient endorses continued symptoms of depression and poor sleep. Her affect appeared flat and congruent with stated mood. The volume of her voice was soft and diminished, but regular rate and rhythm. She reports her current depressive symptoms are largely due to her back and right hip pain. She states she was informed she has a buldging disc in her back and arthritis in her right hip. She has experienced a similar flare before during which she was not able to walk or get out of bed. She notes this current flare is not as bad as she is still able to walk without assistance, and is not restricted to her bed. She is also experiencing difficulty sleeping due to the throbbing and sharp pain at night.   She reports slight improvement in her auditory hallucinations, but continues to hear static-like voices mostly during the day. She denies any SI/VH.  Patient is aggreable to starting Trazodone 25-50 mg at bedtime to help improve sleep. She is also aggreable to discontinuing Lexapro 10 mg and starting Duloxetine 20 mg twice daily to help with depression and better target her pain. No changes to her other medications. No other concerns at this time.   Visit Diagnosis:    ICD-10-CM   1. Anxiety and depression  F41.9 busPIRone (BUSPAR) 15 MG tablet   F32.9 hydrOXYzine (ATARAX/VISTARIL) 10 MG tablet    DULoxetine (CYMBALTA) 20 MG capsule  2. Bipolar disorder, current episode depressed,  severe, without psychotic features ([place])  F31.4 QUEtiapine (SEROQUEL) 200 MG tablet    traZODone (DESYREL) 50 MG tablet    Past Psychiatric History: anxiety, depression, insomnia, and bipolar 2 disorder. Past Medical History:  Past Medical History:  Diagnosis Date  . Bipolar disorder ([place])   . Blood transfusion without reported diagnosis   . Hypertension   . Hypokalemia   . Low back pain with radiation   . Vertigo     Past Surgical History:  Procedure Laterality Date  . ABDOMINAL HYSTERECTOMY      Family Psychiatric History: Notes that her half brother had a mental illness however she is unaware of what it was.    Family History:  Family History  Problem Relation Age of Onset  . Heart disease Mother        heart attack  . Stroke Father     Social History:  Social History   Socioeconomic History  . Marital status: Married    Spouse name: Not on file  . Number of children: Not on file  . Years of education: Not on file  . Highest education level: Not on file  Occupational History  . Not on file  Tobacco Use  . Smoking status: Former Research scientist (life sciences)  . Smokeless tobacco: Never Used  Vaping Use  . Vaping Use: Never used  Substance and Sexual Activity  . Alcohol use: Yes    Alcohol/week: 1.0 - 2.0 standard drink    Types: 1 - 2  Standard drinks or equivalent per week  . Drug use: No  . Sexual activity: Yes  Other Topics Concern  . Not on file  Social History Narrative  . Not on file   Social Determinants of Health   Financial Resource Strain:   . Difficulty of Paying Living Expenses:   Food Insecurity:   . Worried About Charity fundraiser in the Last Year:   . Arboriculturist in the Last Year:   Transportation Needs:   . Film/video editor (Medical):   Marland Kitchen Lack of Transportation (Non-Medical):   Physical Activity:   . Days of Exercise per Week:   . Minutes of Exercise per Session:   Stress:   . Feeling of Stress :   Social Connections:   . Frequency of  Communication with Friends and Family:   . Frequency of Social Gatherings with Friends and Family:   . Attends Religious Services:   . Active Member of Clubs or Organizations:   . Attends Archivist Meetings:   Marland Kitchen Marital Status:     Allergies: No Known Allergies  Metabolic Disorder Labs: Lab Results  Component Value Date   HGBA1C 4.9 06/09/2019   No results found for: PROLACTIN Lab Results  Component Value Date   CHOL 203 (H) 10/22/2017   TRIG 123 10/22/2017   HDL 79 10/22/2017   CHOLHDL 2.6 10/22/2017   LDLCALC 99 10/22/2017   Lab Results  Component Value Date   TSH 1.840 10/22/2017    Therapeutic Level Labs: No results found for: LITHIUM No results found for: VALPROATE No components found for:  CBMZ  Current Medications: Current Outpatient Medications  Medication Sig Dispense Refill  . amLODipine (NORVASC) 5 MG tablet Take 1 tablet (5 mg total) by mouth daily. To lower blood pressure 90 tablet 1  . Ascorbic Acid (VITAMIN C) 1000 MG tablet Take 1,000 mg by mouth daily.    . busPIRone (BUSPAR) 15 MG tablet Take 1 tablet (15 mg total) by mouth 3 (three) times daily. 90 tablet 1  . cetirizine (ZYRTEC) 10 MG tablet Take 1 tablet (10 mg total) by mouth daily. 30 tablet 1  . diclofenac sodium (VOLTAREN) 1 % GEL Apply 4 g topically 4 (four) times daily. To help with knee pain 1 Tube 5  . DULoxetine (CYMBALTA) 20 MG capsule Take 1 capsule (20 mg total) by mouth 2 (two) times daily. 60 capsule 2  . gabapentin (NEURONTIN) 300 MG capsule Take 1 capsule (300 mg total) by mouth 3 (three) times daily. To help with back pain with radiation 90 capsule 3  . hydrOXYzine (ATARAX/VISTARIL) 10 MG tablet Take 1 tablet (10 mg total) by mouth 3 (three) times daily as needed for anxiety. 90 tablet 1  . ibuprofen (ADVIL) 600 MG tablet Take 1 tablet (600 mg total) by mouth every 8 (eight) hours as needed for moderate pain. Eat before taking medication 60 tablet 4  . meclizine (ANTIVERT)  25 MG tablet Take 1 tablet (25 mg total) by mouth 3 (three) times daily as needed for dizziness. 30 tablet 3  . Multiple Vitamins-Minerals (MULTIVITAMIN WITH MINERALS) tablet Take 1 tablet by mouth daily.    . ondansetron (ZOFRAN) 8 MG tablet Take 1 tablet (8 mg total) by mouth every 8 (eight) hours as needed for nausea or vomiting. 20 tablet 0  . QUEtiapine (SEROQUEL) 200 MG tablet Take 1 tablet (200 mg total) by mouth at bedtime. 30 tablet 1  . tiZANidine (ZANAFLEX) 4  MG tablet Take 1 tablet (4 mg total) by mouth every 8 (eight) hours as needed for muscle spasms. 60 tablet 1  . traZODone (DESYREL) 50 MG tablet Take 1 tablet (50 mg total) by mouth at bedtime. 30 tablet 2   No current facility-administered medications for this visit.     Musculoskeletal: Strength & Muscle Tone: within normal limits and decreased Gait & Station: broad based Patient leans: N/A  Psychiatric Specialty Exam: Review of Systems  There were no vitals taken for this visit.There is no height or weight on file to calculate BMI.  General Appearance: Well Groomed  Eye Contact:  Good  Speech:  Clear and Coherent and Normal Rate  Volume:  Decreased  Mood:  Depressed  Affect:  Congruent and Flat  Thought Process:  Coherent, Goal Directed and Linear  Orientation:  Full (Time, Place, and Person)  Thought Content: Hallucinations: Auditory   Suicidal Thoughts:  No  Homicidal Thoughts:  No  Memory:  Immediate;   Good Recent;   Good Remote;   Good  Judgement:  Good  Insight:  Good  Psychomotor Activity:  Normal  Concentration:  Concentration: Good and Attention Span: Good  Recall:  Good  Fund of Knowledge: Good  Language: Good  Akathisia:  No  Handed:  Right  AIMS (if indicated): Not done  Assets:  Communication Skills Desire for Improvement Financial Resources/Insurance Housing Social Support  ADL's:  Intact  Cognition: WNL  Sleep:  Poor   Screenings: GAD-7     Counselor from 11/08/2019 in [facility] Office Visit from 08/16/2019 in [facility] Office Visit from 02/24/2019 in [facility] Office Visit from 09/21/2018 in [facility] Office Visit from 07/27/2018 in [facility]  Total GAD-7 Score 17 16 12 16 17     PHQ2-9     Counselor from 11/08/2019 in [facility] Office Visit from 08/16/2019 in [facility] Office Visit from 02/24/2019 in [facility] Office Visit from 09/21/2018 in [facility] Office Visit from 07/27/2018 in [facility]  PHQ-2 Total Score 6 5 5 6 5   PHQ-9 Total Score 15 22 18 19 18        Assessment and Plan: Patient reorts that she has having increased depression because of impairing medical comorbidities.  She endorses hip pain and back pain from a bulging disc. She notes that she has arthritis.  She reports that her pain interferes with her sleep.  She is agreeable to discontinuing Lexapro 10 mg and starting duloxetine 20 mg twice daily to help with depressive symptoms and pain.  She is also agreeable to starting trazodone 25 to 50 mg as needed for sleep.  1. Anxiety and depression  Continue- busPIRone (BUSPAR) 15 MG tablet; Take 1 tablet (15 mg total) by mouth 3 (three) times daily.  Dispense: 90 tablet; Refill: 1 Continue hydrOXYzine (ATARAX/VISTARIL) 10 MG tablet; Take 1 tablet (10 mg total) by mouth 3 (three) times daily as needed for anxiety.  Dispense: 90 tablet; Refill: 1 Start- DULoxetine (CYMBALTA) 20 MG capsule; Take 1 capsule (20 mg total) by mouth 2 (two) times daily.  Dispense: 60 capsule; Refill: 2  2. Bipolar disorder, current episode depressed, severe, without psychotic features ([place])  Continue- QUEtiapine (SEROQUEL) 200 MG tablet; Take 1 tablet (200 mg total) by mouth  at  bedtime.  Dispense: 30 tablet; Refill: 1 Start- traZODone (DESYREL) 50 MG tablet; Take 1 tablet (50 mg total) by mouth at bedtime.  Dispense: 30 tablet; Refill: 2   Follow-up in 2 months Follow-up with therapy   Salley Slaughter, NP 01/12/2020, 10:47 AM

## 2020-01-20 ENCOUNTER — Ambulatory Visit (INDEPENDENT_AMBULATORY_CARE_PROVIDER_SITE_OTHER): Payer: No Payment, Other | Admitting: Clinical

## 2020-01-20 ENCOUNTER — Telehealth (HOSPITAL_COMMUNITY): Payer: Self-pay | Admitting: Clinical

## 2020-01-20 ENCOUNTER — Other Ambulatory Visit: Payer: Self-pay

## 2020-01-20 DIAGNOSIS — F3130 Bipolar disorder, current episode depressed, mild or moderate severity, unspecified: Secondary | ICD-10-CM

## 2020-01-20 NOTE — Progress Notes (Signed)
   THERAPIST PROGRESS NOTE Virtual Visit via Telephone Note  I connected with Victoria Snyder on 01/20/20 at 10:00 AM EDT by telephone and verified that I am speaking with the correct person using two identifiers.  Location: Patient: Home Provider: Office   I discussed the limitations, risks, security and privacy concerns of performing an evaluation and management service by telephone and the availability of in person appointments. I also discussed with the patient that there may be a patient responsible charge related to this service. The patient expressed understanding and agreed to proceed.   Follow Up Instructions:    I discussed the assessment and treatment plan with the patient. The patient was provided an opportunity to ask questions and all were answered. The patient agreed with the plan and demonstrated an understanding of the instructions.   The patient was advised to call back or seek an in-person evaluation if the symptoms worsen or if the condition fails to improve as anticipated.    Session Time: 16 minutes  Participation Level: Active  Behavioral Response: NAAlertDepressed  Type of Therapy: Individual Therapy  Treatment Goals addressed: Diagnosis: Depression  Interventions: CBT  Summary:  Victoria Snyder is a 54 y.o. female who presents for the session oriented times five and friendly. Client denied hallucinations and delusions.  Client reported on today feeling tired and depressed. Client reported since the last session the psychiatrist adjusted her medication to help with sleep and some chronic pain she has been experiencing. Client reported her back has been causing her problems with being mobile. Client reported her doctor gave her core exercises to complete to help strengthen her back muscles. Client reported the set back of her health is causing her to feel depressed. Client engaged with the therapist in discussion about alternate ways to view the challenges  of her current situation. Client reported she recently bought herself a journal and will begin to write soon. Client reported she is also going to continue to work on completing her household chores in small increments.  Client reported she is doing well with medication compliance and working on getting more sleep as her pain subsides.    Suicidal/Homicidal: Nowithout intent/plan  Therapist Response:  Therapist began the session by checking in and asking how she has been since the last session. Therapist actively listened as she described updates since the last session. Therapist encouraged the client to complete physical exercises as directed by her doctor and to write in her journal.  Therapist assisted with scheduling for next appointments.     Plan: Return again in 4 weeks for individual therapy.  Diagnosis: Bipolar 1 disorder, most recent episode depressed   Darrion Wyszynski Y Meganne Rita, LCSW 01/20/2020

## 2020-03-08 ENCOUNTER — Other Ambulatory Visit: Payer: Self-pay

## 2020-03-08 ENCOUNTER — Ambulatory Visit (INDEPENDENT_AMBULATORY_CARE_PROVIDER_SITE_OTHER): Payer: No Payment, Other | Admitting: Clinical

## 2020-03-08 DIAGNOSIS — F314 Bipolar disorder, current episode depressed, severe, without psychotic features: Secondary | ICD-10-CM

## 2020-03-08 NOTE — Progress Notes (Signed)
   THERAPIST PROGRESS NOTE  Session Time: 30 minutes  Participation Level: Active  Behavioral Response: CasualAlertDepressed  Type of Therapy: Individual Therapy  Treatment Goals addressed: Diagnosis: depression  Interventions: CBT  Summary:  Victoria Snyder is a 54 y.o. female who presents for the scheduled session oriented times five, appropriately dressed, and friendly. Client denied hallucinations and delusions. Client reported on today feeling okay. Client reported she has been compliant with her medication but has not been sleeping well. Client reported she has not been sleeping well due to tension in the house while living with her husband. Client reported she still has ingoing problems in the marriage describing that they coexist. Client reported she spends her time in the house mainly and will have the TV on in the background but doe snot watch it. Client reported she has been focused on her health and getting things ready for her disability hearing that she received word is schedule for the end of this year. Client reported her primary care is helping her with paperwork to get a referral for a specialist to look at her back and hip that consistently bothers her.  Client reported her son calls her daily and her sisters keep in contact with her. Client reported her sisters have told her that she "doesn't call and distances herself" but she has a good relationship with them as they help to look after her. Client discussed she gets in depressive episodes of not wanting to engage with other people. Client stated, "I'm not happy like I want to be" in regards to living in the house with her husband. Client reported her sisters are noticing and asking questions about their marriage but she is guarded with how much she discloses. Client reported she does have a journal and has things that she wants to write but has not gotten it down on paper.    Suicidal/Homicidal: Nowithout  intent/plan  Therapist Response:  Therapist began the session by checking in and asking the client how she has been doing since last seen. Therapist actively listened to the clients thoughts and feelings and utilized empathy in giving feedback. Therapist asked the client open ended questions about how the identified stressors impact her daily functioning and interaction with others. Therapist used CBT to introduce the coping strategy of using time management to put a conscious limit on cutting off the amount of time she engages in depressive behavior. Therapist discussed journaling which the client has indicated she brought to use it as a "safe space" and not be guarded with her thoughts opposed to opening up to telling someone else.  Therapist assisted with scheduling next appointment.  Plan: Return again in 5 weeks for individual therapy.  Diagnosis: Bipolar disorder current episode depressed severe without psychotic features    Birdena Jubilee Shamaine Mulkern, LCSW 03/08/2020

## 2020-03-13 ENCOUNTER — Encounter (HOSPITAL_COMMUNITY): Payer: Self-pay | Admitting: Psychiatry

## 2020-03-13 ENCOUNTER — Other Ambulatory Visit (HOSPITAL_COMMUNITY): Payer: Self-pay | Admitting: Psychiatry

## 2020-03-13 ENCOUNTER — Other Ambulatory Visit: Payer: Self-pay

## 2020-03-13 ENCOUNTER — Ambulatory Visit (INDEPENDENT_AMBULATORY_CARE_PROVIDER_SITE_OTHER): Payer: No Payment, Other | Admitting: Psychiatry

## 2020-03-13 DIAGNOSIS — M545 Low back pain, unspecified: Secondary | ICD-10-CM

## 2020-03-13 DIAGNOSIS — F418 Other specified anxiety disorders: Secondary | ICD-10-CM

## 2020-03-13 DIAGNOSIS — M1611 Unilateral primary osteoarthritis, right hip: Secondary | ICD-10-CM | POA: Diagnosis not present

## 2020-03-13 DIAGNOSIS — F314 Bipolar disorder, current episode depressed, severe, without psychotic features: Secondary | ICD-10-CM

## 2020-03-13 DIAGNOSIS — F32A Depression, unspecified: Secondary | ICD-10-CM

## 2020-03-13 DIAGNOSIS — F419 Anxiety disorder, unspecified: Secondary | ICD-10-CM

## 2020-03-13 MED ORDER — QUETIAPINE FUMARATE 200 MG PO TABS: 200.0000 mg | ORAL_TABLET | Freq: Every day | ORAL | 2 refills | Status: DC

## 2020-03-13 MED ORDER — GABAPENTIN 300 MG PO CAPS: 300.0000 mg | ORAL_CAPSULE | Freq: Three times a day (TID) | ORAL | 3 refills | Status: DC

## 2020-03-13 MED ORDER — BUSPIRONE HCL 15 MG PO TABS: 15.0000 mg | ORAL_TABLET | Freq: Three times a day (TID) | ORAL | 1 refills | Status: DC

## 2020-03-13 MED ORDER — DULOXETINE HCL 60 MG PO CPEP: 60.0000 mg | ORAL_CAPSULE | Freq: Every day | ORAL | 2 refills | Status: DC

## 2020-03-13 MED ORDER — HYDROXYZINE HCL 10 MG PO TABS: 10.0000 mg | ORAL_TABLET | Freq: Three times a day (TID) | ORAL | 1 refills | Status: DC | PRN

## 2020-03-13 MED ORDER — TRAZODONE HCL 100 MG PO TABS: 100.0000 mg | ORAL_TABLET | Freq: Every evening | ORAL | 2 refills | Status: DC | PRN

## 2020-03-13 MED FILL — QUETIAPINE FUMARATE 200 MG: 200 | 30 days supply | Qty: 30 | Fill #0

## 2020-03-13 MED FILL — hydrOXYzine HCL 10 MG TABS: 10 | 30 days supply | Qty: 90 | Fill #0

## 2020-03-13 MED FILL — TRAZODONE HCL 100 MG TABS: 100 | 30 days supply | Qty: 30 | Fill #0

## 2020-03-13 MED FILL — GABAPENTIN 300 MG CAPSULE: 300 | 30 days supply | Qty: 90 | Fill #0

## 2020-03-13 MED FILL — busPIRone HCL 15 MG TABS: 15 | 30 days supply | Qty: 90 | Fill #0

## 2020-03-13 MED FILL — DULoxetine HCL 60 MG CPEP: 60 | 30 days supply | Qty: 30 | Fill #0

## 2020-03-13 NOTE — Progress Notes (Signed)
BH MD/PA/NP OP Progress Note  03/13/2020 1:52 PM CATLYNN GRONDAHL  MRN:  [id number]  Chief Complaint:  "I think my depression is better." Chief Complaint    Medication Management      HPI: 54 year old female seen today for follow up psychiatric evaluation. She has a  psychiatric history of Bipolar 2, anxiety, and depression. She is currently being managed on Seroquel 200 mg daily, Cymbalta 40, Buspar 15 mg three times daily, and Hydroxyzine 10 mg three times daily as needed.  Today patient is well-groomed, pleasant, cooperative, and engaged in conversations.  She informed provider that her depression and anxiety are improving.  Provider conducted a PHQ-9 and patient scored a 17.  Provider also conducted a GAD-7 and patient scored a 20.  Patient notes that her anxiety and depression are exacerbated by poor marital relations.  She notes that she dislikes how her husband talks to her.  Patient notes that she continues to have poor sleep and reports that for the past 2 days she has slept 3 hours a night.  She notes that the Cymbalta has helped her depression as well as her back/hip pain.  She reports improvement in [facility] and notes that she takes Seroquel periodically however reports that for the last week she has taken it as prescribed and reports that current hallucinations are better.  Patient informed provider that 2 weeks ago she felt very depressed and thought about hurting herself.  She notes that she spoke to a female for therapy at [employer] regarding vouchers and assistance programs.  She informed Probation officer that after speaking to this female she was referred to a crisis hotline.  Patient notes that things has improved since that time and notes that she looks forward to speaking to her son who currently lives in [place]. She denies SI/HI.   Patient is aggreable to increasing trazodone 25-50 mg to 100 mg PRN nightly to help improve sleep. She is also aggreable to increase nd starting Duloxetine 40  mg to 60 mg daily to help with depression and better target her pain.  She will continue all other medications as prescribed.  No other concerns at this time.   Visit Diagnosis:    ICD-10-CM   1. Anxiety and depression  F41.9 busPIRone (BUSPAR) 15 MG tablet   F32.A DULoxetine (CYMBALTA) 60 MG capsule    hydrOXYzine (ATARAX/VISTARIL) 10 MG tablet  2. Low back pain with radiation  M54.50 DULoxetine (CYMBALTA) 60 MG capsule    DISCONTINUED: gabapentin (NEURONTIN) 300 MG capsule  3. Primary osteoarthritis of right hip  M16.11 DULoxetine (CYMBALTA) 60 MG capsule    DISCONTINUED: gabapentin (NEURONTIN) 300 MG capsule  4. Bipolar disorder, current episode depressed, severe, without psychotic features ([place])  F31.4 QUEtiapine (SEROQUEL) 200 MG tablet    traZODone (DESYREL) 100 MG tablet    Past Psychiatric History: anxiety, depression, insomnia, and bipolar 2 disorder. Past Medical History:  Past Medical History:  Diagnosis Date  . Bipolar disorder ([place])   . Blood transfusion without reported diagnosis   . Hypertension   . Hypokalemia   . Low back pain with radiation   . Vertigo     Past Surgical History:  Procedure Laterality Date  . ABDOMINAL HYSTERECTOMY      Family Psychiatric History: Notes that her half brother had a mental illness however she is unaware of what it was.    Family History:  Family History  Problem Relation Age of Onset  . Heart disease Mother  heart attack  . Stroke Father     Social History:  Social History   Socioeconomic History  . Marital status: Married    Spouse name: Not on file  . Number of children: Not on file  . Years of education: Not on file  . Highest education level: Not on file  Occupational History  . Not on file  Tobacco Use  . Smoking status: Former Research scientist (life sciences)  . Smokeless tobacco: Never Used  Vaping Use  . Vaping Use: Never used  Substance and Sexual Activity  . Alcohol use: Yes    Alcohol/week: 1.0 - 2.0 standard drink     Types: 1 - 2 Standard drinks or equivalent per week  . Drug use: No  . Sexual activity: Yes  Other Topics Concern  . Not on file  Social History Narrative  . Not on file   Social Determinants of Health   Financial Resource Strain:   . Difficulty of Paying Living Expenses: Not on file  Food Insecurity:   . Worried About Charity fundraiser in the Last Year: Not on file  . Ran Out of Food in the Last Year: Not on file  Transportation Needs:   . Lack of Transportation (Medical): Not on file  . Lack of Transportation (Non-Medical): Not on file  Physical Activity:   . Days of Exercise per Week: Not on file  . Minutes of Exercise per Session: Not on file  Stress:   . Feeling of Stress : Not on file  Social Connections:   . Frequency of Communication with Friends and Family: Not on file  . Frequency of Social Gatherings with Friends and Family: Not on file  . Attends Religious Services: Not on file  . Active Member of Clubs or Organizations: Not on file  . Attends Archivist Meetings: Not on file  . Marital Status: Not on file    Allergies: No Known Allergies  Metabolic Disorder Labs: Lab Results  Component Value Date   HGBA1C 4.9 06/09/2019   No results found for: PROLACTIN Lab Results  Component Value Date   CHOL 203 (H) 10/22/2017   TRIG 123 10/22/2017   HDL 79 10/22/2017   CHOLHDL 2.6 10/22/2017   LDLCALC 99 10/22/2017   Lab Results  Component Value Date   TSH 1.840 10/22/2017    Therapeutic Level Labs: No results found for: LITHIUM No results found for: VALPROATE No components found for:  CBMZ  Current Medications: Current Outpatient Medications  Medication Sig Dispense Refill  . amLODipine (NORVASC) 5 MG tablet Take 1 tablet (5 mg total) by mouth daily. To lower blood pressure 90 tablet 1  . Ascorbic Acid (VITAMIN C) 1000 MG tablet Take 1,000 mg by mouth daily.    . busPIRone (BUSPAR) 15 MG tablet Take 1 tablet (15 mg total) by mouth 3  (three) times daily. 90 tablet 1  . cetirizine (ZYRTEC) 10 MG tablet Take 1 tablet (10 mg total) by mouth daily. 30 tablet 1  . diclofenac sodium (VOLTAREN) 1 % GEL Apply 4 g topically 4 (four) times daily. To help with knee pain 1 Tube 5  . DULoxetine (CYMBALTA) 60 MG capsule Take 1 capsule (60 mg total) by mouth daily. 30 capsule 2  . hydrOXYzine (ATARAX/VISTARIL) 10 MG tablet Take 1 tablet (10 mg total) by mouth 3 (three) times daily as needed for anxiety. 90 tablet 1  . ibuprofen (ADVIL) 600 MG tablet Take 1 tablet (600 mg total) by mouth every  8 (eight) hours as needed for moderate pain. Eat before taking medication 60 tablet 4  . meclizine (ANTIVERT) 25 MG tablet Take 1 tablet (25 mg total) by mouth 3 (three) times daily as needed for dizziness. 30 tablet 3  . Multiple Vitamins-Minerals (MULTIVITAMIN WITH MINERALS) tablet Take 1 tablet by mouth daily.    . ondansetron (ZOFRAN) 8 MG tablet Take 1 tablet (8 mg total) by mouth every 8 (eight) hours as needed for nausea or vomiting. 20 tablet 0  . QUEtiapine (SEROQUEL) 200 MG tablet Take 1 tablet (200 mg total) by mouth at bedtime. 30 tablet 2  . tiZANidine (ZANAFLEX) 4 MG tablet Take 1 tablet (4 mg total) by mouth every 8 (eight) hours as needed for muscle spasms. 60 tablet 1  . traZODone (DESYREL) 100 MG tablet Take 1 tablet (100 mg total) by mouth at bedtime as needed for sleep. 30 tablet 2   No current facility-administered medications for this visit.     Musculoskeletal: Strength & Muscle Tone: within normal limits and decreased Gait & Station: broad based Patient leans: N/A  Psychiatric Specialty Exam: Review of Systems  Blood pressure (!) 136/93, pulse 85, height 5\' 8"  (1.727 m), weight 150 lb (68 kg), SpO2 100 %.Body mass index is 22.81 kg/m.  General Appearance: Well Groomed  Eye Contact:  Good  Speech:  Clear and Coherent and Normal Rate  Volume:  Decreased  Mood:  Depressed  Affect:  Congruent and Flat  Thought Process:   Coherent, Goal Directed and Linear  Orientation:  Full (Time, Place, and Person)  Thought Content: Hallucinations: Auditory   Suicidal Thoughts:  No  Homicidal Thoughts:  No  Memory:  Immediate;   Good Recent;   Good Remote;   Good  Judgement:  Good  Insight:  Good  Psychomotor Activity:  Normal  Concentration:  Concentration: Good and Attention Span: Good  Recall:  Good  Fund of Knowledge: Good  Language: Good  Akathisia:  No  Handed:  Right  AIMS (if indicated): Not done  Assets:  Communication Skills Desire for Improvement Financial Resources/Insurance Housing Social Support  ADL's:  Intact  Cognition: WNL  Sleep:  Poor   Screenings: GAD-7     Clinical Support from 03/13/2020 in [facility] Counselor from 11/08/2019 in [facility] Office Visit from 08/16/2019 in [place] Office Visit from 02/24/2019 in [place] Office Visit from 09/21/2018 in [place]  Total GAD-7 Score 20 17 16 12 16     PHQ2-9     Clinical Support from 03/13/2020 in [facility] Counselor from 11/08/2019 in [facility] Office Visit from 08/16/2019 in [place] Office Visit from 02/24/2019 in [place] Office Visit from 09/21/2018 in [place]  PHQ-2 Total Score 5 6 5 5 6   PHQ-9 Total Score 17 15 22 18 19        Assessment and Plan: Patient notes that her anxiety, depression, and pain has improved since starting Cymbalta.  She also notes that she has been experiencing less hallucinations on current dose of Seroquel.  Patient reports that her sleep still continues to be poor.  She is agreeable to increasing Cymbalta 40 mg to 60 mg to help improve symptoms of anxiety and depression.  She will also increase  trazodone 25 mg-50mg  to 100  mg to help manage sleep.  1. Anxiety and depression  Continue- busPIRone (BUSPAR) 15 MG tablet; Take 1 tablet (15 mg total) by mouth 3 (three) times daily.  Dispense: 90 tablet; Refill: 1 Increased- DULoxetine (CYMBALTA) 60 MG capsule; Take 1 capsule (60 mg total) by mouth daily.  Dispense: 30 capsule; Refill: 2 Continue- hydrOXYzine (ATARAX/VISTARIL) 10 MG tablet; Take 1 tablet (10 mg total) by mouth 3 (three) times daily as needed for anxiety.  Dispense: 90 tablet; Refill: 1  2. Low back pain with radiation Increased- DULoxetine (CYMBALTA) 60 MG capsule; Take 1 capsule (60 mg total) by mouth daily.  Dispense: 30 capsule; Refill: 2  3. Primary osteoarthritis of right hip Increased- DULoxetine (CYMBALTA) 60 MG capsule; Take 1 capsule (60 mg total) by mouth daily.  Dispense: 30 capsule; Refill: 2   4. Bipolar disorder, current episode depressed, severe, without psychotic features ([place])  Continue- QUEtiapine (SEROQUEL) 200 MG tablet; Take 1 tablet (200 mg total) by mouth at bedtime.  Dispense: 30 tablet; Refill: 2 Increased- traZODone (DESYREL) 100 MG tablet; Take 1 tablet (100 mg total) by mouth at bedtime as needed for sleep.  Dispense: 30 tablet; Refill: 2  Follow-up in 2 months Follow-up with therapy   Salley Slaughter, NP 03/13/2020, 1:52 PM

## 2020-03-16 MED FILL — ?AMLODIPINE BESYLATE 5MG TA: 5 | 30 days supply | Qty: 30 | Fill #4

## 2020-04-11 ENCOUNTER — Ambulatory Visit: Payer: Self-pay | Admitting: Internal Medicine

## 2020-04-24 ENCOUNTER — Ambulatory Visit: Payer: Medicaid Other

## 2020-04-26 ENCOUNTER — Ambulatory Visit (HOSPITAL_COMMUNITY)
Admission: EM | Admit: 2020-04-26 | Discharge: 2020-04-26 | Disposition: A | Discharge status: Home / Self Care | Payer: Medicaid Other | Attending: Emergency Medicine | Admitting: Emergency Medicine

## 2020-04-26 ENCOUNTER — Other Ambulatory Visit: Payer: Self-pay | Admitting: Emergency Medicine

## 2020-04-26 ENCOUNTER — Encounter (HOSPITAL_COMMUNITY): Payer: Self-pay | Admitting: Emergency Medicine

## 2020-04-26 ENCOUNTER — Other Ambulatory Visit: Payer: Self-pay

## 2020-04-26 DIAGNOSIS — M5416 Radiculopathy, lumbar region: Secondary | ICD-10-CM

## 2020-04-26 MED ORDER — PREDNISONE 10 MG (21) PO TBPK: ORAL_TABLET | Freq: Every day | ORAL | 0 refills | Status: AC

## 2020-04-26 MED FILL — ?predniSONE 10MG TABS: 10 | 12 days supply | Qty: 42 | Fill #0

## 2020-04-26 MED FILL — AMLODIPINE BESYLATE 5 MG TA: 5 | 30 days supply | Qty: 30 | Fill #5

## 2020-04-26 NOTE — ED Triage Notes (Addendum)
Pt presents with right hip pain that radiates into leg xs 1 year. States has gotten worse in the past week. States is keep her up at night. States has seen Ortho and received injection that gave relief for about 2 months but unable to see them again due to insurance.  Ibuprofen and muscle relaxers give no relief.

## 2020-04-26 NOTE — ED Provider Notes (Signed)
____________________________________________  Time seen: Approximately 9:34 AM  I have reviewed the triage vital signs and the nursing notes.   HISTORY  Chief Complaint Hip Pain and Leg Pain   Historian Patient     HPI Victoria Snyder is a 54 y.o. female presents to the urgent care with right lower extremity pain.  Patient states that the pain goes from her back and radiates down the posterior aspect of the lower extremity.  He states that she has had injections in her back in the past which is helped for couple months but she is unable to follow-up with neurosurgery as she does not currently have insurance.  She denies bowel or bladder incontinence or saddle anesthesia.  No new falls or mechanisms of trauma.  No other alleviating measures of been attempted.   Past Medical History:  Diagnosis Date  . Bipolar disorder ([place])   . Blood transfusion without reported diagnosis   . Hypertension   . Hypokalemia   . Low back pain with radiation   . Vertigo      Immunizations up to date:  Yes.     Past Medical History:  Diagnosis Date  . Bipolar disorder ([place])   . Blood transfusion without reported diagnosis   . Hypertension   . Hypokalemia   . Low back pain with radiation   . Vertigo     Patient Active Problem List   Diagnosis Date Noted  . DDD (degenerative disc disease), lumbar 12/17/2018  . Spinal stenosis of lumbar region 12/17/2018  . Hypertension 03/31/2018  . Low back pain with radiation 03/31/2018  . History of vertigo 03/31/2018  . Anxiety and depression 03/31/2018  . Bipolar disorder ([place]) 03/31/2018    Past Surgical History:  Procedure Laterality Date  . ABDOMINAL HYSTERECTOMY      Prior to Admission medications   Medication Sig Start Date End Date Taking? Authorizing Provider  amLODipine (NORVASC) 5 MG tablet Take 1 tablet (5 mg total) by mouth daily. To lower blood pressure 06/03/19   Fulp, Cammie, MD  Ascorbic Acid (VITAMIN C) 1000 MG tablet  Take 1,000 mg by mouth daily.    [provider]  busPIRone (BUSPAR) 15 MG tablet Take 1 tablet (15 mg total) by mouth 3 (three) times daily. 03/13/20   Salley Slaughter, NP  cetirizine (ZYRTEC) 10 MG tablet Take 1 tablet (10 mg total) by mouth daily. 06/05/18   Charlott Rakes, MD  diclofenac sodium (VOLTAREN) 1 % GEL Apply 4 g topically 4 (four) times daily. To help with knee pain 07/27/18   Fulp, Cammie, MD  DULoxetine (CYMBALTA) 60 MG capsule Take 1 capsule (60 mg total) by mouth daily. 03/13/20   Salley Slaughter, NP  hydrOXYzine (ATARAX/VISTARIL) 10 MG tablet Take 1 tablet (10 mg total) by mouth 3 (three) times daily as needed for anxiety. 03/13/20   Salley Slaughter, NP  ibuprofen (ADVIL) 600 MG tablet Take 1 tablet (600 mg total) by mouth every 8 (eight) hours as needed for moderate pain. Eat before taking medication 06/03/19   Fulp, Ander Gaster, MD  meclizine (ANTIVERT) 25 MG tablet Take 1 tablet (25 mg total) by mouth 3 (three) times daily as needed for dizziness. 02/27/18   Fulp, Cammie, MD  Multiple Vitamins-Minerals (MULTIVITAMIN WITH MINERALS) tablet Take 1 tablet by mouth daily.    [provider]  ondansetron (ZOFRAN) 8 MG tablet Take 1 tablet (8 mg total) by mouth every 8 (eight) hours as needed for nausea or vomiting.  06/17/19   Argentina Donovan, PA-C  predniSONE (STERAPRED UNI-PAK 21 TAB) 10 MG (21) TBPK tablet Take by mouth daily for 12 days. Take 6 tabs by mouth daily  for 2 days, then 5 tabs for 2 days, then 4 tabs for 2 days, then 3 tabs for 2 days, 2 tabs for 2 days, then 1 tab by mouth daily for 2 days 04/26/20 05/08/20  Lannie Fields, PA-C  QUEtiapine (SEROQUEL) 200 MG tablet Take 1 tablet (200 mg total) by mouth at bedtime. 03/13/20   Salley Slaughter, NP  tiZANidine (ZANAFLEX) 4 MG tablet Take 1 tablet (4 mg total) by mouth every 8 (eight) hours as needed for muscle spasms. 01/04/20 05/03/20  Camillia Herter, NP  traZODone (DESYREL) 100 MG tablet Take 1  tablet (100 mg total) by mouth at bedtime as needed for sleep. 03/13/20   Salley Slaughter, NP    Allergies Patient has no known allergies.  Family History  Problem Relation Age of Onset  . Heart disease Mother        heart attack  . Stroke Father     Social History Social History   Tobacco Use  . Smoking status: Former Research scientist (life sciences)  . Smokeless tobacco: Never Used  Vaping Use  . Vaping Use: Never used  Substance Use Topics  . Alcohol use: Yes    Alcohol/week: 1.0 - 2.0 standard drink    Types: 1 - 2 Standard drinks or equivalent per week  . Drug use: No     Review of Systems  Constitutional: No fever/chills Eyes:  No discharge ENT: No upper respiratory complaints. Respiratory: no cough. No SOB/ use of accessory muscles to breath Gastrointestinal:   No nausea, no vomiting.  No diarrhea.  No constipation. Musculoskeletal: Patient has right lower extremity discomfort.  Skin: Negative for rash, abrasions, lacerations, ecchymosis.   ____________________________________________   PHYSICAL EXAM:  VITAL SIGNS: ED Triage Vitals  Enc Vitals Group     BP 04/26/20 0832 (!) 121/96     Pulse Rate 04/26/20 0832 (!) 103     Resp 04/26/20 0832 (!) 1     Temp 04/26/20 0832 98.4 F (36.9 C)     Temp Source 04/26/20 0832 Oral     SpO2 04/26/20 0832 100 %     Weight --      Height --      Head Circumference --      Peak Flow --      Pain Score 04/26/20 0831 9     Pain Loc --      Pain Edu? --      Excl. in [place]? --      Constitutional: Alert and oriented. Well appearing and in no acute distress. Eyes: Conjunctivae are normal. PERRL. EOMI. Head: Atraumatic. Cardiovascular: Normal rate, regular rhythm. Normal S1 and S2.  Good peripheral circulation. Respiratory: Normal respiratory effort without tachypnea or retractions. Lungs CTAB. Good air entry to the bases with no decreased or absent breath sounds Gastrointestinal: Bowel sounds x 4 quadrants. Soft and nontender to  palpation. No guarding or rigidity. No distention. Musculoskeletal: Full range of motion to all extremities. No obvious deformities noted.  Negative straight leg raise on the right.  Patient did have discomfort with internal and external rotation of the right hip. Palpable dorsalis pedis pulse, right. Neurologic:  Normal for age. No gross focal neurologic deficits are appreciated.  Skin:  Skin is warm, dry and intact. No rash noted. Psychiatric: Mood and  affect are normal for age. Speech and behavior are normal.   ____________________________________________   LABS (all labs ordered are listed, but only abnormal results are displayed)  Labs Reviewed - No data to display ____________________________________________  EKG   ____________________________________________  RADIOLOGY  No results found.  ____________________________________________    PROCEDURES  Procedure(s) performed:     Procedures     Medications - No data to display   ____________________________________________   INITIAL IMPRESSION / ASSESSMENT AND PLAN / ED COURSE  Pertinent labs & imaging results that were available during my care of the patient were reviewed by me and considered in my medical decision making (see chart for details).    Assessment and plan Lumbar radiculopathy 54 year old female presents to the presents to the urgent care with complaints of right lower extremity pain.  Patient was mildly tachycardic at triage but vital signs otherwise reassuring.  Respiratory rate was 12 in exam room.  Triage vital signs noted.  On exam, patient had a negative straight leg raise on the right but did have pain with internal and external rotation at the right hip.  I reviewed x-rays of the right hip from 08/13/2018 which revealed marked cystic changes in both femoral heads along with some joint space narrowing.  Patient had an MRI in June 2020 which showed some bulging disks and mild spinal  stenosis.  We'll treat with 12-day taper of prednisone and advised patient to follow-up with neurosurgery.  I suspect that patient's discomfort is a combination of right hip arthritis and lumbar radiculopathy.   ____________________________________________  FINAL CLINICAL IMPRESSION(S) / ED DIAGNOSES  Final diagnoses:  Lumbar radiculopathy      NEW MEDICATIONS STARTED DURING THIS VISIT:  ED Discharge Orders         Ordered    predniSONE (STERAPRED UNI-PAK 21 TAB) 10 MG (21) TBPK tablet  Daily        04/26/20 0930              This chart was dictated using voice recognition software/Dragon. Despite best efforts to proofread, errors can occur which can change the meaning. Any change was purely unintentional.     Lannie Fields, PA-C 04/26/20 0940

## 2020-04-26 NOTE — Discharge Instructions (Addendum)
Take tapered steroid as directed.  

## 2020-04-27 ENCOUNTER — Ambulatory Visit (HOSPITAL_COMMUNITY): Payer: No Payment, Other | Admitting: Clinical

## 2020-04-28 MED FILL — ?DULOXetine HCL 60MG CAPS: 60 | 30 days supply | Qty: 30 | Fill #1

## 2020-05-12 ENCOUNTER — Encounter (HOSPITAL_COMMUNITY): Payer: Self-pay

## 2020-05-12 ENCOUNTER — Other Ambulatory Visit (HOSPITAL_COMMUNITY): Payer: Self-pay | Admitting: Psychiatry

## 2020-05-12 ENCOUNTER — Ambulatory Visit (HOSPITAL_COMMUNITY): Payer: No Payment, Other | Admitting: Clinical

## 2020-05-12 ENCOUNTER — Telehealth (HOSPITAL_COMMUNITY): Payer: Self-pay | Admitting: Clinical

## 2020-05-12 ENCOUNTER — Telehealth (INDEPENDENT_AMBULATORY_CARE_PROVIDER_SITE_OTHER): Payer: No Payment, Other | Admitting: Psychiatry

## 2020-05-12 ENCOUNTER — Encounter (HOSPITAL_COMMUNITY): Payer: Self-pay | Admitting: Psychiatry

## 2020-05-12 ENCOUNTER — Other Ambulatory Visit: Payer: Self-pay

## 2020-05-12 DIAGNOSIS — M1611 Unilateral primary osteoarthritis, right hip: Secondary | ICD-10-CM

## 2020-05-12 DIAGNOSIS — M545 Low back pain, unspecified: Secondary | ICD-10-CM | POA: Diagnosis not present

## 2020-05-12 DIAGNOSIS — F314 Bipolar disorder, current episode depressed, severe, without psychotic features: Secondary | ICD-10-CM | POA: Diagnosis not present

## 2020-05-12 DIAGNOSIS — F419 Anxiety disorder, unspecified: Secondary | ICD-10-CM

## 2020-05-12 DIAGNOSIS — F32A Depression, unspecified: Secondary | ICD-10-CM

## 2020-05-12 MED ORDER — QUETIAPINE FUMARATE 50 MG PO TABS: 50.0000 mg | ORAL_TABLET | Freq: Every day | ORAL | 2 refills | Status: DC

## 2020-05-12 MED ORDER — DULOXETINE HCL 60 MG PO CPEP: 60.0000 mg | ORAL_CAPSULE | Freq: Every day | ORAL | 2 refills | Status: DC

## 2020-05-12 MED ORDER — HYDROXYZINE HCL 10 MG PO TABS: 10.0000 mg | ORAL_TABLET | Freq: Three times a day (TID) | ORAL | 2 refills | Status: DC | PRN

## 2020-05-12 MED ORDER — QUETIAPINE FUMARATE 200 MG PO TABS: 200.0000 mg | ORAL_TABLET | Freq: Every day | ORAL | 2 refills | Status: DC

## 2020-05-12 MED ORDER — TRAZODONE HCL 100 MG PO TABS: 100.0000 mg | ORAL_TABLET | Freq: Every evening | ORAL | 2 refills | Status: DC | PRN

## 2020-05-12 MED ORDER — BUSPIRONE HCL 15 MG PO TABS: 15.0000 mg | ORAL_TABLET | Freq: Three times a day (TID) | ORAL | 2 refills | Status: DC

## 2020-05-12 MED FILL — busPIRone HCL 15 MG TABS: 15 | 30 days supply | Qty: 90 | Fill #0

## 2020-05-12 MED FILL — hydrOXYzine HCL 10 MG TABS: 10 | 30 days supply | Qty: 90 | Fill #0

## 2020-05-12 MED FILL — QUETIAPINE FUMARATE 50 MG T: 50 | 30 days supply | Qty: 30 | Fill #0

## 2020-05-12 MED FILL — QUETIAPINE FUMARATE 200 MG: 200 | 30 days supply | Qty: 30 | Fill #0

## 2020-05-12 MED FILL — TRAZODONE HCL 100 MG TABS: 100 | 30 days supply | Qty: 30 | Fill #0

## 2020-05-12 NOTE — Telephone Encounter (Signed)
Therapist spoke with the client via tele -phone due to problems connecting through [place] video.Client reported she met with the psychiatrist this morning and had a good visit. Client reported she is going through some changes in her living arrangements but managing well with family support. Client reported she would like to wait to talk at her next scheduled therapy session. Client reported she is doing well with no urgent concerns. Client was scheduled for next appointment.
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
## 2020-05-23 ENCOUNTER — Ambulatory Visit: Payer: Medicaid Other

## 2020-06-07 ENCOUNTER — Other Ambulatory Visit: Payer: Self-pay | Admitting: Internal Medicine

## 2020-06-07 ENCOUNTER — Other Ambulatory Visit: Payer: Self-pay | Admitting: Family Medicine

## 2020-06-07 DIAGNOSIS — I10 Essential (primary) hypertension: Secondary | ICD-10-CM

## 2020-06-07 MED FILL — hydrOXYzine HCL 10 MG TABS: 10 | 30 days supply | Qty: 90 | Fill #0

## 2020-06-07 MED FILL — QUETIAPINE FUMARATE 200 MG: 200 | 30 days supply | Qty: 30 | Fill #0

## 2020-06-07 MED FILL — AMLODIPINE BESYLATE 5 MG TA: 5 | 30 days supply | Qty: 30 | Fill #0

## 2020-06-07 MED FILL — busPIRone HCL 15 MG TABS: 15 | 30 days supply | Qty: 90 | Fill #0

## 2020-06-07 MED FILL — QUETIAPINE FUMARATE 50 MG T: 50 | 30 days supply | Qty: 30 | Fill #0

## 2020-06-07 MED FILL — TRAZODONE HCL 100 MG TABS: 100 | 30 days supply | Qty: 30 | Fill #0

## 2020-06-09 MED FILL — ?DULOXetine HCL 60MG CAPS: 60 | 30 days supply | Qty: 30 | Fill #2

## 2020-06-16 ENCOUNTER — Other Ambulatory Visit: Payer: Self-pay | Admitting: Internal Medicine

## 2020-06-16 ENCOUNTER — Other Ambulatory Visit: Payer: Self-pay

## 2020-06-16 ENCOUNTER — Ambulatory Visit: Payer: Self-pay | Attending: Internal Medicine | Admitting: Internal Medicine

## 2020-06-16 DIAGNOSIS — M5136 Other intervertebral disc degeneration, lumbar region: Secondary | ICD-10-CM

## 2020-06-16 DIAGNOSIS — I10 Essential (primary) hypertension: Secondary | ICD-10-CM

## 2020-06-16 DIAGNOSIS — Z2821 Immunization not carried out because of patient refusal: Secondary | ICD-10-CM

## 2020-06-16 DIAGNOSIS — M1611 Unilateral primary osteoarthritis, right hip: Secondary | ICD-10-CM

## 2020-06-16 MED ORDER — CELECOXIB 200 MG PO CAPS: 200.0000 mg | ORAL_CAPSULE | Freq: Every day | ORAL | 6 refills | Status: DC

## 2020-06-16 MED FILL — CELECOXIB 200 MG CAP: 200 | 30 days supply | Qty: 30 | Fill #0

## 2020-06-16 NOTE — Progress Notes (Signed)
Virtual Visit via Telephone Note  I connected with [name] on 06/16/20 at  9:10 AM EST by telephone and verified that I am speaking with the correct person using two identifiers.  Location: Patient: home Provider: office Pt, my CMA Ms. Sallyanne Havers and myself participated. I discussed the limitations, risks, security and privacy concerns of performing an evaluation and management service by telephone and the availability of in person appointments. I also discussed with the patient that there may be a patient responsible charge related to this service. The patient expressed understanding and agreed to proceed.   History of Present Illness: Patient with history of HTN, DDD bulging disc and spinal stenosis of the lumbar spine, OA hips, bipolar disorder.  Previous PCP was Dr. Chapman Fitch who is no longer with the practice.  HTN: compliant with Norvasc and salt restriction No device to check BP No CP/SOB/LE edema.  Transferred to [facility] from [place]. Sees a therapist 2x/mth.  Dx with anx/dep and Bipolar. Reports compliance with meds listed. Doing okay on meds  C/o chronic back pain and RT hip pain.  Had ESI by ortho last yr. Saw ortho Dr. Rosalia Hammers of [facility] 05/31/2020 and had steroid inj to RT hip.  Did not help.  She plans to go back for f/u but no insurance.  Current taking Voltaren gel, Ibuprofen and Robaxin. Takes Ibuprofen Q6-8 hrs Had disability hearing in 04/2020.  Her lawyer requested info from her psychiatrist.  She has a form from her lawyer for her PCP.  HM: 1st shot Moderna vaccine 05/24/2020. Declines flu shot.  Had hysterectomy for fibroids 10 yrs ago. Okay with having Tdap  Outpatient Encounter Medications as of 06/16/2020  Medication Sig   amLODipine (NORVASC) 5 MG tablet TAKE 1 TABLET (5 MG TOTAL) BY MOUTH DAILY. TO LOWER BLOOD PRESSURE   Ascorbic Acid (VITAMIN C) 1000 MG tablet Take 1,000 mg by mouth daily.   busPIRone (BUSPAR) 15 MG tablet Take 1 tablet (15 mg  total) by mouth 3 (three) times daily.   cetirizine (ZYRTEC) 10 MG tablet Take 1 tablet (10 mg total) by mouth daily.   diclofenac sodium (VOLTAREN) 1 % GEL Apply 4 g topically 4 (four) times daily. To help with knee pain   DULoxetine (CYMBALTA) 60 MG capsule Take 1 capsule (60 mg total) by mouth daily.   hydrOXYzine (ATARAX/VISTARIL) 10 MG tablet Take 1 tablet (10 mg total) by mouth 3 (three) times daily as needed for anxiety.   ibuprofen (ADVIL) 600 MG tablet Take 1 tablet (600 mg total) by mouth every 8 (eight) hours as needed for moderate pain. Eat before taking medication   meclizine (ANTIVERT) 25 MG tablet Take 1 tablet (25 mg total) by mouth 3 (three) times daily as needed for dizziness.   Multiple Vitamins-Minerals (MULTIVITAMIN WITH MINERALS) tablet Take 1 tablet by mouth daily.   ondansetron (ZOFRAN) 8 MG tablet Take 1 tablet (8 mg total) by mouth every 8 (eight) hours as needed for nausea or vomiting.   QUEtiapine (SEROQUEL) 200 MG tablet Take 1 tablet (200 mg total) by mouth at bedtime.   QUEtiapine (SEROQUEL) 50 MG tablet Take 1 tablet (50 mg total) by mouth at bedtime.   traZODone (DESYREL) 100 MG tablet Take 1 tablet (100 mg total) by mouth at bedtime as needed for sleep.   No facility-administered encounter medications on file as of 06/16/2020.      Observations/Objective:   Chemistry      Component Value Date/Time   NA  140 06/09/2019 0938   K 4.3 06/09/2019 0938   CL 102 06/09/2019 0938   CO2 21 06/09/2019 0938   BUN 11 06/09/2019 0938   CREATININE 0.88 06/09/2019 0938      Component Value Date/Time   CALCIUM 9.8 06/09/2019 0938   ALKPHOS 87 10/22/2017 1053   AST 20 10/22/2017 1053   ALT 20 10/22/2017 1053   BILITOT 0.6 10/22/2017 1053     Depression screen PHQ 2/9 08/16/2019 02/24/2019 09/21/2018  Decreased Interest 3 3 3   Down, Depressed, Hopeless 2 2 3   PHQ - 2 Score 5 5 6   Altered sleeping 3 2 3   Tired, decreased energy 2 3 3   Change in appetite 1  2 1   Feeling bad or failure about yourself  3 2 2   Trouble concentrating 3 3 2   Moving slowly or fidgety/restless 3 0 2  Suicidal thoughts 2 1 0  PHQ-9 Score 22 18 19   Difficult doing work/chores Extremely dIfficult - Somewhat difficult  Some encounter information is confidential and restricted. Go to Review Flowsheets activity to see all data.  Some recent data might be hidden     Assessment and Plan: 1. Essential hypertension Continue Norvasc and low salt intake - CBC; Future - Comprehensive metabolic panel; Future - Lipid panel; Future  2. Primary osteoarthritis of right hip Stop Ibuprofen.  Change to Celebrex.  She plans to keep f/u appt with ortho - celecoxib (CELEBREX) 200 MG capsule; Take 1 capsule (200 mg total) by mouth daily. Stop Ibuprofen  Dispense: 30 capsule; Refill: 6  3. Lumbar degenerative disc disease See #3 above  4. Influenza vaccination declined   Follow Up Instructions: 6-8 wks for physical   I discussed the assessment and treatment plan with the patient. The patient was provided an opportunity to ask questions and all were answered. The patient agreed with the plan and demonstrated an understanding of the instructions.   The patient was advised to call back or seek an in-person evaluation if the symptoms worsen or if the condition fails to improve as anticipated.  I provided 17 minutes of non-face-to-face time during this encounter.   Karle Plumber, MD

## 2020-06-26 ENCOUNTER — Other Ambulatory Visit: Payer: Self-pay

## 2020-06-26 ENCOUNTER — Ambulatory Visit (INDEPENDENT_AMBULATORY_CARE_PROVIDER_SITE_OTHER): Payer: No Payment, Other | Admitting: Clinical

## 2020-06-26 DIAGNOSIS — F314 Bipolar disorder, current episode depressed, severe, without psychotic features: Secondary | ICD-10-CM | POA: Diagnosis not present
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
## 2020-07-03 MED FILL — DULoxetine HCL 60 MG CPEP: 60 | 30 days supply | Qty: 30 | Fill #2

## 2020-07-03 MED FILL — CELECOXIB 200 MG CAP: 200 | 30 days supply | Qty: 30 | Fill #0

## 2020-07-04 ENCOUNTER — Other Ambulatory Visit: Payer: Self-pay | Admitting: Internal Medicine

## 2020-07-04 DIAGNOSIS — I10 Essential (primary) hypertension: Secondary | ICD-10-CM

## 2020-07-04 MED FILL — AMLODIPINE BESYLATE 5 MG TA: 5 | 30 days supply | Qty: 30 | Fill #0

## 2020-07-11 ENCOUNTER — Other Ambulatory Visit: Payer: Self-pay

## 2020-07-11 ENCOUNTER — Encounter (HOSPITAL_COMMUNITY): Payer: Self-pay | Admitting: Psychiatry

## 2020-07-11 ENCOUNTER — Other Ambulatory Visit (HOSPITAL_COMMUNITY): Payer: Self-pay | Admitting: Psychiatry

## 2020-07-11 ENCOUNTER — Telehealth (INDEPENDENT_AMBULATORY_CARE_PROVIDER_SITE_OTHER): Payer: No Payment, Other | Admitting: Psychiatry

## 2020-07-11 DIAGNOSIS — F314 Bipolar disorder, current episode depressed, severe, without psychotic features: Secondary | ICD-10-CM | POA: Diagnosis not present

## 2020-07-11 DIAGNOSIS — F32A Depression, unspecified: Secondary | ICD-10-CM | POA: Diagnosis not present

## 2020-07-11 DIAGNOSIS — M545 Low back pain, unspecified: Secondary | ICD-10-CM | POA: Diagnosis not present

## 2020-07-11 DIAGNOSIS — F419 Anxiety disorder, unspecified: Secondary | ICD-10-CM

## 2020-07-11 MED ORDER — TRAZODONE HCL 100 MG PO TABS
100.0000 mg | ORAL_TABLET | Freq: Every evening | ORAL | 2 refills | Status: DC | PRN
Start: 2020-07-11 — End: 2020-07-11

## 2020-07-11 MED ORDER — HYDROXYZINE HCL 10 MG PO TABS: 10.0000 mg | ORAL_TABLET | Freq: Three times a day (TID) | ORAL | 2 refills | Status: DC | PRN

## 2020-07-11 MED ORDER — QUETIAPINE FUMARATE 200 MG PO TABS: 200.0000 mg | ORAL_TABLET | Freq: Every day | ORAL | 2 refills | Status: DC

## 2020-07-11 MED ORDER — QUETIAPINE FUMARATE 50 MG PO TABS
50.0000 mg | ORAL_TABLET | Freq: Every day | ORAL | 2 refills | Status: DC
Start: 2020-07-11 — End: 2020-07-11

## 2020-07-11 MED ORDER — BUSPIRONE HCL 15 MG PO TABS: 15.0000 mg | ORAL_TABLET | Freq: Three times a day (TID) | ORAL | 2 refills | Status: DC

## 2020-07-11 MED ORDER — DULOXETINE HCL 40 MG PO CPEP: 20.0000 mg | ORAL_CAPSULE | Freq: Two times a day (BID) | ORAL | 2 refills | Status: DC

## 2020-07-11 MED FILL — QUETIAPINE FUMARATE 50 MG T: 50 | 30 days supply | Qty: 30 | Fill #0

## 2020-07-11 MED FILL — ?BUSPIRONE HCL 15MG TABS: 15 | 30 days supply | Qty: 90 | Fill #0

## 2020-07-11 MED FILL — TRAZODONE HCL 100 MG TABS: 100 | 30 days supply | Qty: 30 | Fill #0

## 2020-07-11 MED FILL — QUETIAPINE FUMARATE 200 MG: 200 | 30 days supply | Qty: 30 | Fill #0

## 2020-07-11 MED FILL — ?DULOXETINE HCL 20MG CPE: 20 | 30 days supply | Qty: 60 | Fill #0

## 2020-07-11 MED FILL — hydrOXYzine HCL 10 MG TABS: 10 | 30 days supply | Qty: 90 | Fill #0

## 2020-07-11 NOTE — Progress Notes (Signed)
BH MD/PA/NP OP Progress Note Virtual Visit via Video Note  I connected with Victoria Snyder on 07/11/20 at  1:00 PM EST by a video enabled telemedicine application and verified that I am speaking with the correct person using two identifiers.  Location: Patient: Home Provider: Clinic   I discussed the limitations of evaluation and management by telemedicine and the availability of in person appointments. The patient expressed understanding and agreed to proceed.  I provided 30 minutes of non-face-to-face time during this encounter.      07/11/2020 1:35 PM Victoria Snyder  MRN:  [id number]  Chief Complaint:  "I'm in so much pain."   HPI: 55 year old female seen today for follow up psychiatric evaluation. She has a  psychiatric history of Bipolar 2, anxiety, and depression. She is currently being managed on Seroquel 250 mg nighlty, Cymbalta 60, Buspar 15 mg three times daily, and Hydroxyzine 25 mg three times daily as needed. She notes that her medications are somewhat effective in managing her psychiatric conditions.   Today she is pleasant, cooperative, maintained eye contact, and engaged in conversation.  She informed provider that her anxiety and depression has somewhat improved however notes that it is still present due to her pain.  She informed provider that she has pain in her right leg and hip.  She notes that this pain interferes with her sleep and activities of daily.  She informed provider that she sleeps 6 to 7 hours nightly however notes that she sleeps 3 hours at a time.  Provider asked patient if she had followed up with her primary care doctor.  She notes that she is unable to afford seeing her orthopedic doctor and is waiting until she gets more money to be reassessed.  Provider commended [facility]  She notes that she has an appointment with them in March.  Notes that until she sees her doctor she continues to wrap her leg and  apply a cream to it.  Today provider conducted a GAD-7 and patient scored a 16, at her last visit she scored a 20.  Patient notes that she worries about her health, her transportation, and her relationship with her husband.  Provider also conducted a PHQ-9 and patient scored 18, at her last visit she scored a 23.  Patient notes that for some time she was off her medications because she could not afford them.  She notes she felt mentally unstable however notes now that she is restarted she is feeling stable.  She denies HI/VAH or paranoia.  Patient notes that she has passive suicidal thoughts however denies plan to hurt himself today.  Today she is agreeable to increasing Cymbalta 60 mg daily to 40 mg twice daily to help manage anxiety, depression, and pain.  She will continue all other medications as prescribed.  She will follow up with outpatient counseling for therapy.  No other concerns noted at this time.     Visit Diagnosis:    ICD-10-CM   1. Anxiety and depression  F41.9 busPIRone (BUSPAR) 15 MG tablet   F32.A hydrOXYzine (ATARAX/VISTARIL) 10 MG tablet  2. Low back pain with radiation  M54.50 DULoxetine 40 MG CPEP  3. Bipolar disorder, current episode depressed, severe, without psychotic features ([place])  F31.4 QUEtiapine (SEROQUEL) 200 MG tablet    QUEtiapine (SEROQUEL) 50 MG tablet    traZODone (DESYREL) 100 MG tablet    Past Psychiatric History: anxiety, depression, insomnia, and bipolar 2 disorder.  Past Medical History:  Past Medical History:  Diagnosis Date  . Bipolar disorder ([place])   . Blood transfusion without reported diagnosis   . Hypertension   . Hypokalemia   . Low back pain with radiation   . Vertigo     Past Surgical History:  Procedure Laterality Date  . ABDOMINAL HYSTERECTOMY      Family Psychiatric History: Notes that her half brother had a mental illness however she is unaware of what it was.    Family History:  Family History  Problem Relation Age of Onset   . Heart disease Mother        heart attack  . Stroke Father     Social History:  Social History   Socioeconomic History  . Marital status: Married    Spouse name: Not on file  . Number of children: Not on file  . Years of education: Not on file  . Highest education level: Not on file  Occupational History  . Not on file  Tobacco Use  . Smoking status: Former Research scientist (life sciences)  . Smokeless tobacco: Never Used  Vaping Use  . Vaping Use: Never used  Substance and Sexual Activity  . Alcohol use: Yes    Alcohol/week: 1.0 - 2.0 standard drink    Types: 1 - 2 Standard drinks or equivalent per week  . Drug use: No  . Sexual activity: Yes  Other Topics Concern  . Not on file  Social History Narrative  . Not on file   Social Determinants of Health   Financial Resource Strain: Not on file  Food Insecurity: Not on file  Transportation Needs: Not on file  Physical Activity: Not on file  Stress: Not on file  Social Connections: Not on file    Allergies: No Known Allergies  Metabolic Disorder Labs: Lab Results  Component Value Date   HGBA1C 4.9 06/09/2019   No results found for: PROLACTIN Lab Results  Component Value Date   CHOL 203 (H) 10/22/2017   TRIG 123 10/22/2017   HDL 79 10/22/2017   CHOLHDL 2.6 10/22/2017   LDLCALC 99 10/22/2017   Lab Results  Component Value Date   TSH 1.840 10/22/2017    Therapeutic Level Labs: No results found for: LITHIUM No results found for: VALPROATE No components found for:  CBMZ  Current Medications: Current Outpatient Medications  Medication Sig Dispense Refill  . DULoxetine 40 MG CPEP Take 20 mg by mouth 2 (two) times daily. 60 capsule 2  . amLODipine (NORVASC) 5 MG tablet TAKE 1 TABLET (5 MG TOTAL) BY MOUTH DAILY. TO LOWER BLOOD PRESSURE 30 tablet 1  . Ascorbic Acid (VITAMIN C) 1000 MG tablet Take 1,000 mg by mouth daily.    . busPIRone (BUSPAR) 15 MG tablet Take 1 tablet (15 mg total) by mouth 3 (three) times daily. 90 tablet 2   . celecoxib (CELEBREX) 200 MG capsule Take 1 capsule (200 mg total) by mouth daily. Stop Ibuprofen 30 capsule 6  . cetirizine (ZYRTEC) 10 MG tablet Take 1 tablet (10 mg total) by mouth daily. 30 tablet 1  . diclofenac sodium (VOLTAREN) 1 % GEL Apply 4 g topically 4 (four) times daily. To help with knee pain 1 Tube 5  . hydrOXYzine (ATARAX/VISTARIL) 10 MG tablet Take 1 tablet (10 mg total) by mouth 3 (three) times daily as needed for anxiety. 90 tablet 2  . meclizine (ANTIVERT) 25 MG tablet Take 1 tablet (25 mg total) by mouth 3 (three) times daily as needed  for dizziness. 30 tablet 3  . Multiple Vitamins-Minerals (MULTIVITAMIN WITH MINERALS) tablet Take 1 tablet by mouth daily.    . ondansetron (ZOFRAN) 8 MG tablet Take 1 tablet (8 mg total) by mouth every 8 (eight) hours as needed for nausea or vomiting. 20 tablet 0  . QUEtiapine (SEROQUEL) 200 MG tablet Take 1 tablet (200 mg total) by mouth at bedtime. 30 tablet 2  . QUEtiapine (SEROQUEL) 50 MG tablet Take 1 tablet (50 mg total) by mouth at bedtime. 30 tablet 2  . traZODone (DESYREL) 100 MG tablet Take 1 tablet (100 mg total) by mouth at bedtime as needed for sleep. 30 tablet 2   No current facility-administered medications for this visit.     Musculoskeletal: Strength & Muscle Tone: Unable to assess due to telehealth visit [place]: Unable to assess due to telehealth visit Patient leans: N/A  Psychiatric Specialty Exam: Review of Systems  There were no vitals taken for this visit.There is no height or weight on file to calculate BMI.  General Appearance: Well Groomed  Eye Contact:  Good  Speech:  Clear and Coherent and Normal Rate  Volume:  Decreased  Mood:  Anxious and Depressed  Affect:  Congruent  Thought Process:  Coherent, Goal Directed and Linear  Orientation:  Full (Time, Place, and Person)  Thought Content: Hallucinations: Auditory   Suicidal Thoughts:  No  Homicidal Thoughts:  No  Memory:  Immediate;    Good Recent;   Good Remote;   Good  Judgement:  Good  Insight:  Good  Psychomotor Activity:  Normal  Concentration:  Concentration: Good and Attention Span: Good  Recall:  Good  Fund of Knowledge: Good  Language: Good  Akathisia:  No  Handed:  Right  AIMS (if indicated): Not done  Assets:  Communication Skills Desire for Improvement Financial Resources/Insurance Housing Social Support  ADL's:  Intact  Cognition: WNL  Sleep:  Fair   Screenings: GAD-7   Flowsheet Row Video Visit from 07/11/2020 in [facility] Video Visit from 05/12/2020 in [place] from 03/13/2020 in [facility] Counselor from 11/08/2019 in [facility] Office Visit from 08/16/2019 in [place]  Total GAD-7 Score 16 20 20 17 16     PHQ2-9   Flowsheet Row Video Visit from 07/11/2020 in [facility] Video Visit from 05/12/2020 in [place] from 03/13/2020 in [facility] Counselor from 11/08/2019 in [facility] Office Visit from 08/16/2019 in [place]  PHQ-2 Total Score 3 6 5 6 5   PHQ-9 Total Score 18 23 17 15 22     Flowsheet Row Video Visit from 07/11/2020 in [place] Error: Q7 should not be populated when Q6 is No       Assessment and Plan: Patient endorses symptoms of anxiety and depression. Today she is agreeable to increasing Cymbalta 60 mg daily to 40 mg twice daily to help manage anxiety, depression, and pain.     1. Anxiety and depression  Continue- busPIRone (BUSPAR) 15 MG tablet; Take 1 tablet (15 mg total) by mouth 3 (three) times daily.  Dispense: 90 tablet; Refill: 2 Continue- hydrOXYzine (ATARAX/VISTARIL) 10 MG tablet; Take  1 tablet (10 mg total) by mouth 3 (three) times daily as needed for anxiety.  Dispense: 90  tablet; Refill: 2  2. Low back pain with radiation  Increased- DULoxetine 40 MG CPEP; Take 20 mg by mouth 2 (two) times daily.  Dispense: 60 capsule; Refill: 2  3. Bipolar disorder, current episode depressed, severe, without psychotic features ([place])  Continue- QUEtiapine (SEROQUEL) 200 MG tablet; Take 1 tablet (200 mg total) by mouth at bedtime.  Dispense: 30 tablet; Refill: 2 Continue- QUEtiapine (SEROQUEL) 50 MG tablet; Take 1 tablet (50 mg total) by mouth at bedtime.  Dispense: 30 tablet; Refill: 2 Continue- traZODone (DESYREL) 100 MG tablet; Take 1 tablet (100 mg total) by mouth at bedtime as needed for sleep.  Dispense: 30 tablet; Refill: 2    Follow-up in 2 months Follow-up with therapy   Salley Slaughter, NP 07/11/2020, 1:35 PM

## 2020-07-13 MED FILL — AMLODIPINE BESYLATE 5 MG TA: 5 | 30 days supply | Qty: 30 | Fill #0

## 2020-08-18 ENCOUNTER — Encounter: Payer: Self-pay | Admitting: Internal Medicine

## 2020-08-18 ENCOUNTER — Other Ambulatory Visit: Payer: Self-pay

## 2020-08-18 ENCOUNTER — Ambulatory Visit: Payer: Medicaid Other | Attending: Internal Medicine | Admitting: Internal Medicine

## 2020-08-18 VITALS — BP 110/90 | HR 94 | Resp 16 | Ht 67.0 in | Wt 156.0 lb

## 2020-08-18 DIAGNOSIS — Z1159 Encounter for screening for other viral diseases: Secondary | ICD-10-CM

## 2020-08-18 DIAGNOSIS — Z1231 Encounter for screening mammogram for malignant neoplasm of breast: Secondary | ICD-10-CM

## 2020-08-18 DIAGNOSIS — M1611 Unilateral primary osteoarthritis, right hip: Secondary | ICD-10-CM

## 2020-08-18 DIAGNOSIS — I10 Essential (primary) hypertension: Secondary | ICD-10-CM

## 2020-08-18 DIAGNOSIS — Z114 Encounter for screening for human immunodeficiency virus [HIV]: Secondary | ICD-10-CM

## 2020-08-18 DIAGNOSIS — R220 Localized swelling, mass and lump, head: Secondary | ICD-10-CM

## 2020-08-18 DIAGNOSIS — Z0000 Encounter for general adult medical examination without abnormal findings: Secondary | ICD-10-CM

## 2020-08-18 DIAGNOSIS — K5901 Slow transit constipation: Secondary | ICD-10-CM

## 2020-08-18 DIAGNOSIS — Z23 Encounter for immunization: Secondary | ICD-10-CM

## 2020-08-18 DIAGNOSIS — Z1211 Encounter for screening for malignant neoplasm of colon: Secondary | ICD-10-CM

## 2020-08-18 MED FILL — AMLODIPINE BESYLATE 5 MG TA: 5 | 30 days supply | Qty: 30 | Fill #0

## 2020-08-18 MED FILL — CELECOXIB 200 MG CAP: 200 | 30 days supply | Qty: 30 | Fill #1

## 2020-08-18 NOTE — Patient Instructions (Addendum)
Please apply for the orange card/cone discount card.  Once approved we can refer you to orthopedics. Please stop at the pharmacy today to get the prescription filled for Celebrex.  This is the medication that I have prescribed for you for the arthritis pain. Once approved for the orange card we will refer you to dermatology for the mass on the  right side of your chin.  Purchase and use MiraLAX over-the-counter as needed for constipation.  Purchase a Medication box over-the-counter and fill it once a week with your medications.  This will help you to remember to take your medicines regularly.  Td (Tetanus, Diphtheria) Vaccine: What You Need to Know 1. Why get vaccinated? Td vaccine can prevent tetanus and diphtheria. Tetanus enters the body through cuts or wounds. Diphtheria spreads from person to person.  TETANUS (T) causes painful stiffening of the muscles. Tetanus can lead to serious health problems, including being unable to open the mouth, having trouble swallowing and breathing, or death.  DIPHTHERIA (D) can lead to difficulty breathing, heart failure, paralysis, or death. 2. Td vaccine Td is only for children 7 years and older, adolescents, and adults.  Td is usually given as a booster dose every 10 years, or after 5 years in the case of a severe or dirty wound or burn. Another vaccine, called "Tdap," may be used instead of Td. Tdap protects against pertussis, also known as "whooping cough," in addition to tetanus and diphtheria. Td may be given at the same time as other vaccines. 3. Talk with your health care provider Tell your vaccination provider if the person getting the vaccine:  Has had an allergic reaction after a previous dose of any vaccine that protects against tetanus or diphtheria, or has any severe, life-threatening allergies  Has ever had Guillain-Barr Syndrome (also called "GBS")  Has had severe pain or swelling after a previous dose of any vaccine that protects  against tetanus or diphtheria In some cases, your health care provider may decide to postpone Td vaccination until a future visit. People with minor illnesses, such as a cold, may be vaccinated. People who are moderately or severely ill should usually wait until they recover before getting Td vaccine.  Your health care provider can give you more information. 4. Risks of a vaccine reaction  Pain, redness, or swelling where the shot was given, mild fever, headache, feeling tired, and nausea, vomiting, diarrhea, or stomachache sometimes happen after Td vaccination. People sometimes faint after medical procedures, including vaccination. Tell your provider if you feel dizzy or have vision changes or ringing in the ears.  As with any medicine, there is a very remote chance of a vaccine causing a severe allergic reaction, other serious injury, or death. 5. What if there is a serious problem? An allergic reaction could occur after the vaccinated person leaves the clinic. If you see signs of a severe allergic reaction (hives, swelling of the face and throat, difficulty breathing, a fast heartbeat, dizziness, or weakness), call 9-1-1 and get the person to the nearest hospital.  For other signs that concern you, call your health care provider.  Adverse reactions should be reported to the Vaccine Adverse Event Reporting System (VAERS). Your health care provider will usually file this report, or you can do it yourself. Visit the VAERS website at www.vaers.SamedayNews.es or call 786 648 5487. VAERS is only for reporting reactions, and VAERS staff members do not give medical advice. 6. The National Vaccine Injury Compensation Program The [employer] Vaccine Injury Compensation Program (  VICP) is a federal program that was created to compensate people who may have been injured by certain vaccines. Claims regarding alleged injury or death due to vaccination have a time limit for filing, which may be as short as two years. Visit  the VICP website at GoldCloset.com.ee or call 204-106-1189 to learn about the program and about filing a claim. 7. How can I learn more?  Ask your health care provider.  Call your local or state health department.  Visit the website of the Food and Drug Administration (FDA) for vaccine package inserts and additional information at TraderRating.uy.  Contact the Centers for Disease Control and Prevention (CDC): ? Call (860)354-9141 (1-800-CDC-INFO) or ? Visit CDC's website at http://hunter.com/. Vaccine Information Statement Td (Tetanus, Diphtheria) Vaccine (12/31/2019) This information is not intended to replace advice given to you by your health care provider. Make sure you discuss any questions you have with your health care provider. Document Revised: 02/17/2020 Document Reviewed: 02/17/2020 Elsevier Patient Education  2021 [employer].

## 2020-08-18 NOTE — Progress Notes (Addendum)
Patient ID: Victoria Snyder, female    DOB: Sep 12, 1965  MRN: [id number]  CC: Annual Exam   Subjective: Victoria Snyder is a 55 y.o. female who presents for annual exam Her concerns today include:  Patient with history of HTN, DDD bulging disc and spinal stenosis of the lumbar spine, OA hips, bipolar disorder/anx/dep followed by [facility].   Pt had partial hysterectomy for fibroids 10 yrs ago.   Due for MMG.  Does not check breasts.  No fhx of breast cancer. Due for colonoscopy.  No fhx of colon CA. Hx of constipation where she is unable to go for several days and when she does the stools are hard. Has hx of hemorrhoids.  Occasional blood in stool when she has flare of hemorrhoids.  Eating okay, no problems swallowing.  No major weight changes. Due for screening HIV/hep c.  Agreeable to screening. Immunization:  Reports had 2nd shot of Moderna a few mths ago but does not have card with our.   HTN: Blood pressure noted to be elevated today.  Did not take Norvasc as yet for today and admits that she sometimes forgets to take it.  No headaches or dizziness.  No chest pains or shortness of breath.  Pain in RT hip. Pain worse at nights -throbbing and like a catch at nights in hip (she points to the right groin area), thigh and sometimes around the knee.  She also has pain in the hip/right groin area with rotation of the hip joint for instance when she swings her leg out to exit the car.  She was seeing orthopedics at [facility] clinic in [place] for arthritis in her right hip.  She has not been able to follow-up due to lack of insurance.  On her telephone visit with me in January of this year, I had her stop ibuprofen and prescribed Celebrex instead.  However she has not picked it up as yet.  Past medical, surgical, family history and social history reviewed.  Patient Active Problem List   Diagnosis Date Noted  . DDD (degenerative disc disease), lumbar 12/17/2018  . Spinal stenosis of lumbar region  12/17/2018  . Hypertension 03/31/2018  . Low back pain with radiation 03/31/2018  . History of vertigo 03/31/2018  . Anxiety and depression 03/31/2018  . Bipolar disorder ([place]) 03/31/2018     Current Outpatient Medications on File Prior to Visit  Medication Sig Dispense Refill  . amLODipine (NORVASC) 5 MG tablet TAKE 1 TABLET (5 MG TOTAL) BY MOUTH DAILY. TO LOWER BLOOD PRESSURE 30 tablet 1  . Ascorbic Acid (VITAMIN C) 1000 MG tablet Take 1,000 mg by mouth daily.    . busPIRone (BUSPAR) 15 MG tablet Take 1 tablet (15 mg total) by mouth 3 (three) times daily. 90 tablet 2  . celecoxib (CELEBREX) 200 MG capsule Take 1 capsule (200 mg total) by mouth daily. Stop Ibuprofen 30 capsule 6  . cetirizine (ZYRTEC) 10 MG tablet Take 1 tablet (10 mg total) by mouth daily. 30 tablet 1  . diclofenac sodium (VOLTAREN) 1 % GEL Apply 4 g topically 4 (four) times daily. To help with knee pain 1 Tube 5  . DULoxetine 40 MG CPEP Take 20 mg by mouth 2 (two) times daily. 60 capsule 2  . hydrOXYzine (ATARAX/VISTARIL) 10 MG tablet Take 1 tablet (10 mg total) by mouth 3 (three) times daily as needed for anxiety. 90 tablet 2  . meclizine (ANTIVERT) 25 MG tablet Take 1 tablet (25 mg total) by  mouth 3 (three) times daily as needed for dizziness. 30 tablet 3  . Multiple Vitamins-Minerals (MULTIVITAMIN WITH MINERALS) tablet Take 1 tablet by mouth daily.    . ondansetron (ZOFRAN) 8 MG tablet Take 1 tablet (8 mg total) by mouth every 8 (eight) hours as needed for nausea or vomiting. 20 tablet 0  . QUEtiapine (SEROQUEL) 200 MG tablet Take 1 tablet (200 mg total) by mouth at bedtime. 30 tablet 2  . QUEtiapine (SEROQUEL) 50 MG tablet Take 1 tablet (50 mg total) by mouth at bedtime. 30 tablet 2  . traZODone (DESYREL) 100 MG tablet Take 1 tablet (100 mg total) by mouth at bedtime as needed for sleep. 30 tablet 2   No current facility-administered medications on file prior to visit.    No Known Allergies  Social History    Socioeconomic History  . Marital status: Married    Spouse name: Not on file  . Number of children: 1  . Years of education: Not on file  . Highest education level: 12th grade  Occupational History  . Occupation: Unemployed  Tobacco Use  . Smoking status: Former Research scientist (life sciences)  . Smokeless tobacco: Never Used  Vaping Use  . Vaping Use: Never used  Substance and Sexual Activity  . Alcohol use: Yes    Comment: Drinks occasionally  . Drug use: No  . Sexual activity: Yes  Other Topics Concern  . Not on file  Social History Narrative  . Not on file   Social Determinants of Health   Financial Resource Strain: Not on file  Food Insecurity: Not on file  Transportation Needs: Not on file  Physical Activity: Not on file  Stress: Not on file  Social Connections: Not on file  Intimate Partner Violence: Not on file    Family History  Problem Relation Age of Onset  . Heart disease Mother        heart attack  . Stroke Father     Past Surgical History:  Procedure Laterality Date  . ABDOMINAL HYSTERECTOMY      ROS: Review of Systems  Constitutional: Negative for activity change.  HENT: Negative for hearing loss.   Eyes: Positive for visual disturbance (wore rxn glasses but misplaced. Can not afford eye exam).  Gastrointestinal: Negative for abdominal pain.  Skin:       Has lump on RT side of chin that has been present for about 2 years.  She is not sure of increase in size.  No pain.      PHYSICAL EXAM: BP 110/90   Pulse 94   Resp 16   Ht 5\' 7"  (1.702 m)   Wt 156 lb (70.8 kg)   SpO2 100%   BMI 24.43 kg/m   Physical Exam  General appearance - alert, well appearing, middle-aged African-American female and in no distress Mental status - normal mood, behavior, speech, dress, motor activity, and thought processes Eyes - pupils equal and reactive, extraocular eye movements intact Ears - bilateral TM's and external ear canals normal Nose - normal and patent, no erythema,  discharge or polyps Mouth - mucous membranes moist, pharynx normal without lesions Neck - supple, no significant adenopathy Lymphatics - no palpable lymphadenopathy, no hepatosplenomegaly Chest - clear to auscultation, no wheezes, rales or rhonchi, symmetric air entry Heart - normal rate, regular rhythm, normal S1, S2, no murmurs, rubs, clicks or gallops Abdomen - soft, nontender, nondistended, no masses or organomegaly Breasts -my CMA Ms. Lebron Quam is present: Breasts appear normal, no suspicious masses,  no skin or nipple changes or axillary nodes.  Noted to have soft movable cyst in the center of the sternum.  It is about 2 cm in size.  Patient reports this has been present for a few years and was drained in the past.  No increase in size.  No pain. Musculoskeletal -gait is normal.  Right hip: He has mild to moderate discomfort with passive internal and external rotation of the hip in flexion and extension.  Right knee: No edema or erythema.  No point tenderness.  Good range of motion. Extremities - peripheral pulses normal, no pedal edema, no clubbing or cyanosis Skin -soft mass seen and palpated on the chin right side.  It is also felt on palpation from inside of the lower lip.  No mass felt under the tongue.  It is nontender to touch.  Please see picture below.     .  CMP Latest Ref Rng & Units 06/09/2019 02/24/2019 03/27/2018  Glucose 65 - 99 mg/dL 110(H) 122(H) 67  BUN 6 - 24 mg/dL 11 18 13   Creatinine 0.57 - 1.00 mg/dL 0.88 0.86 0.80  Sodium 134 - 144 mmol/L 140 142 145(H)  Potassium 3.5 - 5.2 mmol/L 4.3 3.8 4.6  Chloride 96 - 106 mmol/L 102 102 102  CO2 20 - 29 mmol/L 21 24 26   Calcium 8.7 - 10.2 mg/dL 9.8 9.9 10.3(H)  Total Protein 6.0 - 8.5 g/dL - - -  Total Bilirubin 0.0 - 1.2 mg/dL - - -  Alkaline Phos 39 - 117 IU/L - - -  AST 0 - 40 IU/L - - -  ALT 0 - 32 IU/L - - -   Lipid Panel     Component Value Date/Time   CHOL 203 (H) 10/22/2017 1106   TRIG 123 10/22/2017 1106   HDL  79 10/22/2017 1106   CHOLHDL 2.6 10/22/2017 1106   LDLCALC 99 10/22/2017 1106    CBC    Component Value Date/Time   WBC 4.9 06/09/2019 0938   WBC 5.9 12/13/2017 0533   RBC 4.69 06/09/2019 0938   RBC 5.25 (H) 12/13/2017 0533   HGB 15.1 06/09/2019 0938   HCT 43.7 06/09/2019 0938   PLT 292 06/09/2019 0938   MCV 93 06/09/2019 0938   MCH 32.2 06/09/2019 0938   MCH 31.0 12/13/2017 0533   MCHC 34.6 06/09/2019 0938   MCHC 35.1 12/13/2017 0533   RDW 12.1 06/09/2019 0938   LYMPHSABS 1.2 09/25/2013 1437   MONOABS 0.6 09/25/2013 1437   EOSABS 0.1 09/25/2013 1437   BASOSABS 0.0 09/25/2013 1437    ASSESSMENT AND PLAN: 1. Annual physical exam Pap not indicated as patient has had hysterectomy for noncancerous reason. Discussed the importance of healthy eating habits and regular exercise.  Encouraged her to move as much as her hip would allow with goal of trying to get in at least 30 minutes of walking 3 to 4 days a week. -Advised to get eye exam done when she is able to afford.  I given names of some affordable places to get eye exams like [facility], [facility] and [facility]  2. Essential hypertension Not at goal.  Patient has not taken Norvasc as yet for today.  Encouraged her to do so once she returns home.  Recommend purchasing a weekly med box so that she can feel it once a week and keep it in a place that is visible to her so that she remembers to take her medicine. - CBC - Comprehensive metabolic  panel - Lipid panel  3. Primary osteoarthritis of right hip Last x-ray of the hip that I see in the system was March 2020 and revealed arthritis changes.  I recommend that she pick up the Celebrex and take it as prescribed.  Do not take any other over-the-counter NSAIDs while on the medication.  Advised to apply for the orange card/[facility] discount.  Once approved we can refer her to orthopedic here in town  4. Slow transit constipation Encourage her to drink several glasses of water daily.   Incorporate green leafy vegetables into the diet daily.  Use MiraLAX over-the-counter as needed.  5. Mass of chin Likely cysts but I would like to refer her to a specialist and possibly for advanced imaging.  Advised to apply for the orange card/cone discount card.  Bring her back in 6 weeks to see whether she has been approved.  6. Need for Tdap vaccination Patient agreeable to receiving Tdap  7. Encounter for screening mammogram for malignant neoplasm of breast - MM Digital Screening; Future  8. Screening for colon cancer Discussed colon cancer screening methods.  She is uninsured.  She is agreeable to doing fecal occult test known as the fit test. - Fecal occult blood, imunochemical([facility])  9. Screening for HIV (human immunodeficiency virus) - HIV antibody (with reflex)  10. Need for hepatitis C screening test - Hepatitis C Antibody   Patient was given the opportunity to ask questions.  Patient verbalized understanding of the plan and was able to repeat key elements of the plan.   Orders Placed This Encounter  Procedures  . Fecal occult blood, imunochemical([facility])  . MM Digital Screening  . Hepatitis C Antibody  . HIV antibody (with reflex)  . CBC  . Comprehensive metabolic panel  . Lipid panel     Requested Prescriptions    No prescriptions requested or ordered in this encounter    Return in about 6 weeks (around 09/29/2020) for tele visit in 6 wks. Karle Plumber, MD, FACP

## 2020-08-19 LAB — COMPREHENSIVE METABOLIC PANEL
ALT: 20 IU/L (ref 0–32)
AST: 18 IU/L (ref 0–40)
Albumin/Globulin Ratio: 2 (ref 1.2–2.2)
Albumin: 4.9 g/dL (ref 3.8–4.9)
Alkaline Phosphatase: 90 IU/L (ref 44–121)
BUN/Creatinine Ratio: 16 (ref 9–23)
BUN: 12 mg/dL (ref 6–24)
Bilirubin Total: 0.7 mg/dL (ref 0.0–1.2)
CO2: 24 mmol/L (ref 20–29)
Calcium: 10.2 mg/dL (ref 8.7–10.2)
Chloride: 99 mmol/L (ref 96–106)
Creatinine, Ser: 0.77 mg/dL (ref 0.57–1.00)
Globulin, Total: 2.5 g/dL (ref 1.5–4.5)
Glucose: 92 mg/dL (ref 65–99)
Potassium: 4.4 mmol/L (ref 3.5–5.2)
Sodium: 141 mmol/L (ref 134–144)
Total Protein: 7.4 g/dL (ref 6.0–8.5)
eGFR: 91 mL/min/{1.73_m2} (ref >59)

## 2020-08-19 LAB — LIPID PANEL
Chol/HDL Ratio: 2.1 ratio (ref 0.0–4.4)
Cholesterol, Total: 207 mg/dL — ABNORMAL HIGH (ref 100–199)
HDL: 100 mg/dL (ref >39)
LDL Chol Calc (NIH): 94 mg/dL (ref 0–99)
Triglycerides: 76 mg/dL (ref 0–149)
VLDL Cholesterol Cal: 13 mg/dL (ref 5–40)

## 2020-08-19 LAB — CBC
Hematocrit: 43.4 % (ref 34.0–46.6)
Hemoglobin: 15.3 g/dL (ref 11.1–15.9)
MCH: 32.1 pg (ref 26.6–33.0)
MCHC: 35.3 g/dL (ref 31.5–35.7)
MCV: 91 fL (ref 79–97)
Platelets: 297 10*3/uL (ref 150–450)
RBC: 4.76 x10E6/uL (ref 3.77–5.28)
RDW: 11.6 % — ABNORMAL LOW (ref 11.7–15.4)
WBC: 4.5 10*3/uL (ref 3.4–10.8)

## 2020-08-19 LAB — HEPATITIS C ANTIBODY: Hep C Virus Ab: <0.1 s/co ratio (ref 0.0–0.9)

## 2020-08-19 LAB — HIV ANTIBODY (ROUTINE TESTING W REFLEX): HIV Screen 4th Generation wRfx: NONREACTIVE

## 2020-08-19 NOTE — Progress Notes (Signed)
Let patient know the following: Hepatitis C and HIV screening tests are negative. Blood cell counts are all normal. Kidney and liver function tests are normal. Total cholesterol is mildly elevated at 207 with goal being less than 200.  Healthy eating habits and regular exercise will help to lower cholesterol.

## 2020-08-21 ENCOUNTER — Telehealth: Payer: Self-pay | Admitting: *Deleted

## 2020-08-21 NOTE — Telephone Encounter (Signed)
-----   Message from Ladell Pier, MD sent at 08/19/2020  4:36 PM EDT ----- Let patient know the following: Hepatitis C and HIV screening tests are negative. Blood cell counts are all normal. Kidney and liver function tests are normal. Total cholesterol is mildly elevated at 207 with goal being less than 200.  Healthy eating habits and regular exercise will help to lower cholesterol.

## 2020-08-21 NOTE — Telephone Encounter (Signed)
Medical Assistant left message on patient's home and cell voicemail. Voicemail states to give a call back to [place] with [facility] at [phone]. Per signed DPR MA left VM sharing: Patient is aware of labs and screenings being negative and normal. Patient aware of slightly elevated cholesterol and addressing this concern with a healthy diet and regular exercise.

## 2020-08-24 ENCOUNTER — Ambulatory Visit: Payer: Medicaid Other

## 2020-08-26 ENCOUNTER — Other Ambulatory Visit: Payer: Self-pay

## 2020-08-30 ENCOUNTER — Other Ambulatory Visit: Payer: Self-pay

## 2020-08-30 MED FILL — Amlodipine Besylate Tab 5 MG (Base Equivalent): ORAL | 30 days supply | Qty: 30 | Fill #0 | Status: AC

## 2020-08-30 MED FILL — Celecoxib Cap 200 MG: ORAL | 30 days supply | Qty: 30 | Fill #0 | Status: AC

## 2020-09-04 ENCOUNTER — Other Ambulatory Visit: Payer: Self-pay

## 2020-09-04 ENCOUNTER — Ambulatory Visit: Payer: Medicaid Other | Attending: Internal Medicine

## 2020-09-07 ENCOUNTER — Telehealth (HOSPITAL_COMMUNITY): Payer: No Payment, Other | Admitting: Psychiatry

## 2020-09-07 ENCOUNTER — Other Ambulatory Visit: Payer: Self-pay

## 2020-09-29 ENCOUNTER — Telehealth (HOSPITAL_COMMUNITY): Payer: Self-pay | Admitting: Psychiatry

## 2020-09-29 ENCOUNTER — Encounter (HOSPITAL_COMMUNITY): Payer: Self-pay

## 2020-09-29 ENCOUNTER — Other Ambulatory Visit: Payer: Self-pay

## 2020-09-29 ENCOUNTER — Telehealth (INDEPENDENT_AMBULATORY_CARE_PROVIDER_SITE_OTHER): Payer: No Payment, Other | Admitting: Psychiatry

## 2020-09-29 ENCOUNTER — Other Ambulatory Visit (HOSPITAL_COMMUNITY): Payer: Self-pay | Admitting: Psychiatry

## 2020-09-29 DIAGNOSIS — F32A Depression, unspecified: Secondary | ICD-10-CM

## 2020-09-29 DIAGNOSIS — F419 Anxiety disorder, unspecified: Secondary | ICD-10-CM

## 2020-09-29 DIAGNOSIS — M545 Low back pain, unspecified: Secondary | ICD-10-CM

## 2020-09-29 DIAGNOSIS — F314 Bipolar disorder, current episode depressed, severe, without psychotic features: Secondary | ICD-10-CM

## 2020-09-29 MED ORDER — DULOXETINE HCL 40 MG PO CPEP
20.0000 mg | ORAL_CAPSULE | Freq: Two times a day (BID) | ORAL | 2 refills | Status: DC
  Filled 2020-09-29: qty 60, fill #0

## 2020-09-29 MED ORDER — DULOXETINE HCL 40 MG PO CPEP
40.0000 mg | ORAL_CAPSULE | Freq: Two times a day (BID) | ORAL | 2 refills | Status: DC
  Filled 2020-09-29 – 2020-10-19 (×2): qty 60, 30d supply, fill #0

## 2020-09-29 MED ORDER — HYDROXYZINE HCL 10 MG PO TABS
ORAL_TABLET | ORAL | 2 refills | Status: DC
  Filled 2020-09-29 – 2020-10-19 (×2): qty 90, 30d supply, fill #0

## 2020-09-29 MED ORDER — QUETIAPINE FUMARATE 200 MG PO TABS
ORAL_TABLET | Freq: Every day | ORAL | 2 refills | Status: DC
  Filled 2020-09-29 – 2020-10-19 (×2): qty 30, 30d supply, fill #0

## 2020-09-29 MED ORDER — TRAZODONE HCL 100 MG PO TABS
ORAL_TABLET | Freq: Every evening | ORAL | 2 refills | Status: DC | PRN
  Filled 2020-09-29 – 2020-10-19 (×2): qty 30, 30d supply, fill #0

## 2020-09-29 MED ORDER — BUSPIRONE HCL 15 MG PO TABS
ORAL_TABLET | ORAL | 2 refills | Status: DC
  Filled 2020-09-29 – 2020-10-19 (×3): qty 90, 30d supply, fill #0

## 2020-09-29 MED ORDER — QUETIAPINE FUMARATE 50 MG PO TABS
ORAL_TABLET | Freq: Every day | ORAL | 2 refills | Status: DC
Start: 2020-09-29 — End: 2020-10-20
  Filled 2020-09-29 – 2020-10-19 (×2): qty 30, 30d supply, fill #0

## 2020-09-29 NOTE — Telephone Encounter (Signed)
Patient connected on telehealth for a few miniutes however she had issues with her audio. Provider attempted to call patient on her cell, on her husbands cell, & via email without response. Provider refilled patients medicaitons. She can rescheldule appointment.

## 2020-09-29 NOTE — Progress Notes (Signed)
Patient connected on telehealth for a few miniutes however she had issues with her audio. Provider attempted to call patient on her cell, on her husbands cell, & via email without response. Provider refilled patients medicaitons. She can rescheldule app.

## 2020-10-02 ENCOUNTER — Other Ambulatory Visit: Payer: Self-pay

## 2020-10-04 ENCOUNTER — Ambulatory Visit (HOSPITAL_COMMUNITY): Payer: No Payment, Other | Admitting: Clinical

## 2020-10-04 ENCOUNTER — Other Ambulatory Visit: Payer: Self-pay

## 2020-10-09 ENCOUNTER — Other Ambulatory Visit: Payer: Self-pay

## 2020-10-13 ENCOUNTER — Ambulatory Visit: Payer: Self-pay | Attending: Internal Medicine | Admitting: Internal Medicine

## 2020-10-13 ENCOUNTER — Other Ambulatory Visit: Payer: Self-pay

## 2020-10-13 DIAGNOSIS — T7491XA Unspecified adult maltreatment, confirmed, initial encounter: Secondary | ICD-10-CM

## 2020-10-13 DIAGNOSIS — R220 Localized swelling, mass and lump, head: Secondary | ICD-10-CM
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
## 2020-10-16 ENCOUNTER — Telehealth: Payer: Self-pay
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
## 2020-10-19 ENCOUNTER — Other Ambulatory Visit: Payer: Self-pay | Admitting: Internal Medicine

## 2020-10-19 ENCOUNTER — Other Ambulatory Visit: Payer: Self-pay

## 2020-10-19 DIAGNOSIS — I10 Essential (primary) hypertension: Secondary | ICD-10-CM

## 2020-10-19 MED ORDER — AMLODIPINE BESYLATE 5 MG PO TABS
ORAL_TABLET | Freq: Two times a day (BID) | ORAL | 3 refills | Status: DC
  Filled 2020-10-19: qty 30, 30d supply, fill #0
  Filled 2020-11-23: qty 30, 30d supply, fill #1
  Filled 2021-01-05: qty 30, 30d supply, fill #2

## 2020-10-20 ENCOUNTER — Other Ambulatory Visit: Payer: Self-pay

## 2020-10-20 ENCOUNTER — Telehealth (INDEPENDENT_AMBULATORY_CARE_PROVIDER_SITE_OTHER): Payer: No Payment, Other | Admitting: Psychiatry

## 2020-10-20 ENCOUNTER — Encounter (HOSPITAL_COMMUNITY): Payer: Self-pay | Admitting: Psychiatry

## 2020-10-20 DIAGNOSIS — F32A Depression, unspecified: Secondary | ICD-10-CM

## 2020-10-20 DIAGNOSIS — F314 Bipolar disorder, current episode depressed, severe, without psychotic features: Secondary | ICD-10-CM

## 2020-10-20 DIAGNOSIS — M545 Low back pain, unspecified: Secondary | ICD-10-CM | POA: Diagnosis not present

## 2020-10-20 DIAGNOSIS — F419 Anxiety disorder, unspecified: Secondary | ICD-10-CM

## 2020-10-20 MED ORDER — BUSPIRONE HCL 15 MG PO TABS
ORAL_TABLET | ORAL | 2 refills | Status: DC
  Filled 2020-10-20: qty 90, 30d supply, fill #0
  Filled 2020-11-29: qty 90, 30d supply, fill #1

## 2020-10-20 MED ORDER — QUETIAPINE FUMARATE 200 MG PO TABS
ORAL_TABLET | Freq: Every day | ORAL | 2 refills | Status: DC
  Filled 2020-11-29 (×2): qty 30, 30d supply, fill #0

## 2020-10-20 MED ORDER — QUETIAPINE FUMARATE 50 MG PO TABS
ORAL_TABLET | Freq: Every day | ORAL | 2 refills | Status: DC
  Filled 2020-11-29 (×2): qty 30, 30d supply, fill #0

## 2020-10-20 MED ORDER — DULOXETINE HCL 40 MG PO CPEP
40.0000 mg | ORAL_CAPSULE | Freq: Two times a day (BID) | ORAL | 2 refills | Status: DC
  Filled 2020-11-29: qty 60, 30d supply, fill #0

## 2020-10-20 MED ORDER — TRAZODONE HCL 100 MG PO TABS
ORAL_TABLET | Freq: Every evening | ORAL | 2 refills | Status: DC | PRN
  Filled 2020-11-29 (×2): qty 30, 30d supply, fill #0

## 2020-10-20 MED ORDER — HYDROXYZINE HCL 10 MG PO TABS
ORAL_TABLET | ORAL | 2 refills | Status: DC
  Filled 2020-11-29: qty 90, 30d supply, fill #0
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
## 2020-10-26 ENCOUNTER — Other Ambulatory Visit: Payer: Self-pay

## 2020-11-23 ENCOUNTER — Other Ambulatory Visit: Payer: Self-pay

## 2020-11-24 ENCOUNTER — Telehealth (HOSPITAL_COMMUNITY): Payer: Self-pay | Admitting: Clinical

## 2020-11-24 ENCOUNTER — Ambulatory Visit (INDEPENDENT_AMBULATORY_CARE_PROVIDER_SITE_OTHER): Payer: No Payment, Other | Admitting: Clinical

## 2020-11-24 ENCOUNTER — Other Ambulatory Visit: Payer: Self-pay

## 2020-11-24 DIAGNOSIS — F314 Bipolar disorder, current episode depressed, severe, without psychotic features: Secondary | ICD-10-CM | POA: Diagnosis not present
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
## 2020-11-24 NOTE — Telephone Encounter (Signed)
Patient was seen on 10/20/2020. Provider sent patients medications to [facility]. She should have two refills left.

## 2020-11-28 ENCOUNTER — Other Ambulatory Visit: Payer: Self-pay

## 2020-11-29 ENCOUNTER — Other Ambulatory Visit: Payer: Self-pay

## 2020-12-01 ENCOUNTER — Other Ambulatory Visit: Payer: Self-pay

## 2020-12-14 ENCOUNTER — Ambulatory Visit (INDEPENDENT_AMBULATORY_CARE_PROVIDER_SITE_OTHER): Payer: No Payment, Other | Admitting: Clinical

## 2020-12-14 DIAGNOSIS — F314 Bipolar disorder, current episode depressed, severe, without psychotic features: Secondary | ICD-10-CM | POA: Diagnosis not present

## 2020-12-15 ENCOUNTER — Telehealth (INDEPENDENT_AMBULATORY_CARE_PROVIDER_SITE_OTHER): Payer: No Payment, Other | Admitting: Psychiatry

## 2020-12-15 ENCOUNTER — Other Ambulatory Visit: Payer: Self-pay

## 2020-12-15 ENCOUNTER — Encounter (HOSPITAL_COMMUNITY): Payer: Self-pay | Admitting: Psychiatry

## 2020-12-15 ENCOUNTER — Telehealth (HOSPITAL_COMMUNITY): Payer: No Payment, Other | Admitting: Psychiatry

## 2020-12-15 DIAGNOSIS — F314 Bipolar disorder, current episode depressed, severe, without psychotic features: Secondary | ICD-10-CM | POA: Diagnosis not present

## 2020-12-15 DIAGNOSIS — F419 Anxiety disorder, unspecified: Secondary | ICD-10-CM

## 2020-12-15 DIAGNOSIS — M545 Low back pain, unspecified: Secondary | ICD-10-CM

## 2020-12-15 DIAGNOSIS — F32A Depression, unspecified: Secondary | ICD-10-CM

## 2020-12-15 MED ORDER — BUSPIRONE HCL 15 MG PO TABS
ORAL_TABLET | ORAL | 3 refills | Status: DC
  Filled 2020-12-15: qty 90, fill #0

## 2020-12-15 MED ORDER — QUETIAPINE FUMARATE 50 MG PO TABS
ORAL_TABLET | Freq: Every day | ORAL | 3 refills | Status: DC
  Filled 2020-12-15: qty 30, fill #0

## 2020-12-15 MED ORDER — QUETIAPINE FUMARATE 200 MG PO TABS
ORAL_TABLET | Freq: Every day | ORAL | 3 refills | Status: DC
Start: 2020-12-15 — End: 2021-06-01
  Filled 2020-12-15: qty 30, fill #0

## 2020-12-15 MED ORDER — HYDROXYZINE HCL 10 MG PO TABS
ORAL_TABLET | ORAL | 3 refills | Status: DC
Start: 2020-12-15 — End: 2021-06-01
  Filled 2020-12-15: qty 90, fill #0
  Filled 2021-04-02: qty 90, 30d supply, fill #0

## 2020-12-15 MED ORDER — DULOXETINE HCL 40 MG PO CPEP
40.0000 mg | ORAL_CAPSULE | Freq: Two times a day (BID) | ORAL | 3 refills | Status: DC
  Filled 2020-12-15 – 2021-04-02 (×2): qty 60, 30d supply, fill #0

## 2020-12-15 MED ORDER — TRAZODONE HCL 100 MG PO TABS
ORAL_TABLET | Freq: Every evening | ORAL | 3 refills | Status: DC | PRN
  Filled 2020-12-15: qty 30, fill #0
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
## 2020-12-15 NOTE — Progress Notes (Signed)
[place] MD/PA/NP OP Progress Note Virtual Visit via Video Note  I connected with Victoria Snyder on 12/15/20 at 10:30 AM EDT by a video enabled telemedicine application and verified that I am speaking with the correct person using two identifiers.  Location: Patient: Home Provider: Clinic   I discussed the limitations of evaluation and management by telemedicine and the availability of in person appointments. The patient expressed understanding and agreed to proceed.  I provided 30 minutes of non-face-to-face time during this encounter.      12/15/2020 11:28 AM Victoria Snyder  MRN:  [id number]  Chief Complaint:  "I've had pain in my left hip and I am more depresses"   HPI: 55 year old female seen today for follow up psychiatric evaluation. She has a  psychiatric history of Bipolar 2, anxiety, and depression. She is currently being managed on Seroquel 250 mg nighlty,  Seroquel 50 mg daily, Cymbalta 40 twice daily, Buspar 15 mg three times daily, and Hydroxyzine 25 mg three times daily as needed. She notes that she ran out of her medications and recently restarted them 2-3 weeks ago.   Today she is pleasant, cooperative, maintained eye contact, and engaged in conversation.  She informed provider that she is in the process of movimg out of her home and into her sisters home. She notes that she and her husband are separating. She informed Probation officer that he gave her a hard time when she attempted to move her things but reports that her sisters stepped in to assist. Patient notes that since being off of her medications she has been more depressed. She also notes that her marital stressors worsen her anxiety and depression. Today provider conducted a PHQ 9 and patient scored a 24, at her last visit she scored a 12. Provider also conducted a GAD 7 and patient scored a 19, at her last visit she scored a 16. She notes that she is worried about her health. Patient notes that she has been having severe left hip  pain. She notes that it make it difficult to walk at times and reports that she has caught her self from falling on different occassions. She notes she plans to follow up with her PCP soon.   She endorses adequate sleep and appetite. Today she denies SI/HI/VAH, mania, or paranoia.   Today no medication changes made. She is agreeable to continue all medications as prescribed and follow up with outpatient counseling for therapy.  No other concerns noted at this time.     Visit Diagnosis:    ICD-10-CM   1. Anxiety and depression  F41.9 busPIRone (BUSPAR) 15 MG tablet   F32.A DULoxetine HCl 40 MG CPEP    hydrOXYzine (ATARAX/VISTARIL) 10 MG tablet    2. Low back pain with radiation  M54.50 DULoxetine HCl 40 MG CPEP    3. Bipolar disorder, current episode depressed, severe, without psychotic features ([place])  F31.4 QUEtiapine (SEROQUEL) 200 MG tablet    QUEtiapine (SEROQUEL) 50 MG tablet    traZODone (DESYREL) 100 MG tablet      Past Psychiatric History: anxiety, depression, insomnia, and bipolar 2 disorder. Past Medical History:  Past Medical History:  Diagnosis Date   Bipolar disorder ([place])    Blood transfusion without reported diagnosis    Hypertension    Hypokalemia    Low back pain with radiation    Vertigo     Past Surgical History:  Procedure Laterality Date   ABDOMINAL HYSTERECTOMY  Family Psychiatric History: Notes that her half brother had a mental illness however she is unaware of what it was.     Family History:  Family History  Problem Relation Age of Onset   Heart disease Mother        heart attack   Stroke Father     Social History:  Social History   Socioeconomic History   Marital status: Married    Spouse name: Not on file   Number of children: 1   Years of education: Not on file   Highest education level: 12th grade  Occupational History   Occupation: Unemployed  Tobacco Use   Smoking status: Former   Smokeless tobacco: Never  Brewing technologist Use: Never used  Substance and Sexual Activity   Alcohol use: Yes    Comment: Drinks occasionally   Drug use: No   Sexual activity: Yes  Other Topics Concern   Not on file  Social History Narrative   Not on file   Social Determinants of Health   Financial Resource Strain: Not on file  Food Insecurity: Not on file  Transportation Needs: Not on file  Physical Activity: Not on file  Stress: Not on file  Social Connections: Not on file    Allergies: No Known Allergies  Metabolic Disorder Labs: Lab Results  Component Value Date   HGBA1C 4.9 06/09/2019   No results found for: PROLACTIN Lab Results  Component Value Date   CHOL 207 (H) 08/18/2020   TRIG 76 08/18/2020   HDL 100 08/18/2020   CHOLHDL 2.1 08/18/2020   LDLCALC 94 08/18/2020   LDLCALC 99 10/22/2017   Lab Results  Component Value Date   TSH 1.840 10/22/2017    Therapeutic Level Labs: No results found for: LITHIUM No results found for: VALPROATE No components found for:  CBMZ  Current Medications: Current Outpatient Medications  Medication Sig Dispense Refill   amLODipine (NORVASC) 5 MG tablet TAKE 1 TABLET (5 MG TOTAL) BY MOUTH DAILY. TO LOWER BLOOD PRESSURE 30 tablet 3   Ascorbic Acid (VITAMIN C) 1000 MG tablet Take 1,000 mg by mouth daily.     busPIRone (BUSPAR) 15 MG tablet TAKE 1 TABLET (15 MG TOTAL) BY MOUTH 3 (THREE) TIMES DAILY. 90 tablet 3   celecoxib (CELEBREX) 200 MG capsule TAKE 1 CAPSULE (200 MG TOTAL) BY MOUTH DAILY. STOP IBUPROFEN 30 capsule 6   diclofenac sodium (VOLTAREN) 1 % GEL Apply 4 g topically 4 (four) times daily. To help with knee pain 1 Tube 5   DULoxetine HCl 40 MG CPEP Take 40 mg by mouth 2 (two) times daily at 10 AM and 5 PM. 60 capsule 3   hydrOXYzine (ATARAX/VISTARIL) 10 MG tablet TAKE 1 TABLET (10 MG TOTAL) BY MOUTH 3 (THREE) TIMES DAILY AS NEEDED FOR ANXIETY. 90 tablet 3   meclizine (ANTIVERT) 25 MG tablet Take 1 tablet (25 mg total) by mouth 3 (three) times daily as  needed for dizziness. 30 tablet 3   Multiple Vitamins-Minerals (MULTIVITAMIN WITH MINERALS) tablet Take 1 tablet by mouth daily.     QUEtiapine (SEROQUEL) 200 MG tablet TAKE 1 TABLET (200 MG TOTAL) BY MOUTH AT BEDTIME. 30 tablet 3   QUEtiapine (SEROQUEL) 50 MG tablet TAKE 1 TABLET (50 MG TOTAL) BY MOUTH AT BEDTIME. 30 tablet 3   traZODone (DESYREL) 100 MG tablet TAKE 1 TABLET (100 MG TOTAL) BY MOUTH AT BEDTIME AS NEEDED FOR SLEEP. 30 tablet 3   No current facility-administered medications  for this visit.     Musculoskeletal: Strength & Muscle Tone:  Unable to assess due to telehealth visit [place]:  Unable to assess due to telehealth visit Patient leans: N/A  Psychiatric Specialty Exam: Review of Systems  There were no vitals taken for this visit.There is no height or weight on file to calculate BMI.  General Appearance: Well Groomed  Eye Contact:  Good  Speech:  Clear and Coherent and Normal Rate  Volume:  Normal  Mood:  Anxious and Depressed  Affect:  Congruent  Thought Process:  Coherent, Goal Directed and Linear  Orientation:  Full (Time, Place, and Person)  Thought Content: WDL and Logical   Suicidal Thoughts:  No  Homicidal Thoughts:  No  Memory:  Immediate;   Good Recent;   Good Remote;   Good  Judgement:  Good  Insight:  Good  Psychomotor Activity:  Normal  Concentration:  Concentration: Good and Attention Span: Good  Recall:  Good  Fund of Knowledge: Good  Language: Good  Akathisia:  No  Handed:  Right  AIMS (if indicated): Not done  Assets:  Communication Skills Desire for Improvement Financial Resources/Insurance Housing Social Support  ADL's:  Intact  Cognition: WNL  Sleep:  Good   Screenings: GAD-7    Flowsheet Row Video Visit from 12/15/2020 in [place] from 10/13/2020 in [place] Video Visit from 07/11/2020 in [facility] Video Visit from  05/12/2020 in [place] from 03/13/2020 in [facility]  Total GAD-7 Score 19 16 16 20 20       PHQ2-9    Flowsheet Row Video Visit from 12/15/2020 in [place] from 10/13/2020 in [place] Video Visit from 07/11/2020 in [facility] Video Visit from 05/12/2020 in [place] from 03/13/2020 in [place]  PHQ-2 Total Score 6 4 3 6 5   PHQ-9 Total Score 24 12 18 23 17       Flowsheet Row Video Visit from 12/15/2020 in [facility] Video Visit from 07/11/2020 in [place] Error: Q7 should not be populated when Q6 is No Error: Q7 should not be populated when Q6 is No        Assessment and Plan: Patient endorses symptoms of anxiety and depression due to marital stressors and pain. She notes that she restarted her medications 2-3 weeks ago and request no adjustments at this time. No medication changes made today. Patient agreeable to continue medications as prescribed.     1. Anxiety and depression  Continue- busPIRone (BUSPAR) 15 MG tablet; Take 1 tablet (15 mg total) by mouth 3 (three) times daily.  Dispense: 90 tablet; Refill: 2 Continue- hydrOXYzine (ATARAX/VISTARIL) 10 MG tablet; Take 1 tablet (10 mg total) by mouth 3 (three) times daily as needed for anxiety.  Dispense: 90 tablet; Refill: 2  2. Low back pain with radiation  Continue- DULoxetine 40 MG CPEP; Take 20 mg by mouth 2 (two) times daily.  Dispense: 60 capsule; Refill: 2  3. Bipolar disorder, current episode depressed, severe, without psychotic features ([place])  Continue- QUEtiapine (SEROQUEL) 200 MG tablet; Take 1 tablet (200 mg total) by mouth at bedtime.  Dispense: 30 tablet; Refill: 2 Continue-  QUEtiapine (SEROQUEL) 50 MG tablet; Take 1 tablet (50  mg total) by mouth at bedtime.  Dispense: 30 tablet; Refill: 2 Continue- traZODone (DESYREL) 100 MG tablet; Take 1 tablet (100 mg total) by mouth at bedtime as needed for sleep.  Dispense: 30 tablet; Refill: 2    Follow-up in 80months Follow-up with therapy   Salley Slaughter, NP 12/15/2020, 11:28 AM

## 2020-12-22 ENCOUNTER — Other Ambulatory Visit: Payer: Self-pay

## 2021-01-01 ENCOUNTER — Telehealth (HOSPITAL_COMMUNITY): Payer: No Payment, Other | Admitting: Psychiatry

## 2021-01-05 ENCOUNTER — Other Ambulatory Visit: Payer: Self-pay

## 2021-01-08 ENCOUNTER — Other Ambulatory Visit: Payer: Self-pay

## 2021-01-15 ENCOUNTER — Other Ambulatory Visit: Payer: Self-pay

## 2021-01-15 ENCOUNTER — Ambulatory Visit: Payer: Medicaid Other | Attending: Internal Medicine | Admitting: Internal Medicine

## 2021-01-15 ENCOUNTER — Encounter: Payer: Self-pay | Admitting: Internal Medicine

## 2021-01-15 DIAGNOSIS — Z1211 Encounter for screening for malignant neoplasm of colon: Secondary | ICD-10-CM

## 2021-01-15 DIAGNOSIS — M5136 Other intervertebral disc degeneration, lumbar region: Secondary | ICD-10-CM

## 2021-01-15 DIAGNOSIS — M1611 Unilateral primary osteoarthritis, right hip: Secondary | ICD-10-CM

## 2021-01-15 DIAGNOSIS — I10 Essential (primary) hypertension: Secondary | ICD-10-CM

## 2021-01-15 DIAGNOSIS — R220 Localized swelling, mass and lump, head: Secondary | ICD-10-CM

## 2021-01-15 DIAGNOSIS — Z1231 Encounter for screening mammogram for malignant neoplasm of breast: Secondary | ICD-10-CM

## 2021-01-15 MED ORDER — AMLODIPINE BESYLATE 5 MG PO TABS
ORAL_TABLET | Freq: Two times a day (BID) | ORAL | 6 refills | Status: DC
Start: 2021-01-15 — End: 2021-12-05
  Filled 2021-01-15: qty 30, fill #0
  Filled 2021-02-09 – 2021-02-16 (×2): qty 30, 30d supply, fill #0
  Filled 2021-04-02: qty 90, 90d supply, fill #1
  Filled 2021-08-10: qty 30, 30d supply, fill #0
  Filled 2021-09-17: qty 30, 30d supply, fill #1
  Filled 2021-10-26: qty 30, 30d supply, fill #2

## 2021-01-15 MED ORDER — CELECOXIB 200 MG PO CAPS
ORAL_CAPSULE | ORAL | 6 refills | Status: DC
Start: 2021-01-15 — End: 2021-05-17
  Filled 2021-01-15 – 2021-02-16 (×3): qty 30, 30d supply, fill #0
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
## 2021-01-22 ENCOUNTER — Other Ambulatory Visit: Payer: Self-pay

## 2021-01-25 ENCOUNTER — Other Ambulatory Visit: Payer: Self-pay | Admitting: Internal Medicine

## 2021-01-25 DIAGNOSIS — Z1231 Encounter for screening mammogram for malignant neoplasm of breast: Secondary | ICD-10-CM

## 2021-02-09 ENCOUNTER — Other Ambulatory Visit: Payer: Self-pay

## 2021-02-09 ENCOUNTER — Ambulatory Visit: Payer: Self-pay | Attending: Internal Medicine | Admitting: Pharmacist

## 2021-02-09 VITALS — BP 110/72 | HR 96

## 2021-02-09 DIAGNOSIS — I10 Essential (primary) hypertension: Secondary | ICD-10-CM

## 2021-02-09 NOTE — Progress Notes (Signed)
   S:    PCP: Dr. Wynetta Emery  Patient arrives in good spirits.  Presents to the clinic for hypertension evaluation, counseling, and management.  Patient was referred and last seen by Primary Care Provider on 01/15/2021.   Today, patient reports she has had trouble remembering to take her medications due to issues in her personal life. She states that over the last week she has forgotten to take her amlodipine 4 times. States she's under a lot of stress at the moment, recently separated with her husband and is now living with a friend. Her husband had previously helped her with her medications. These changes in her personal life has caused her to have worsening depression and anxiety.   Medication adherence denied. Patient has missed 4 doses of amlodipine. See above for reasoning.   Current BP Medications include:  amlodipine 5 mg daily   Antihypertensives tried in the past include: None  O:  Vitals:   02/09/21 1505  BP: 110/72  Pulse: 96   Home BP readings: does not check at home and does not have a home BP monitor   Last 3 Office BP readings: BP Readings from Last 3 Encounters:  02/09/21 110/72  08/18/20 110/90  04/26/20 (!) 121/96   BMET    Component Value Date/Time   NA 141 08/18/2020 1133   K 4.4 08/18/2020 1133   CL 99 08/18/2020 1133   CO2 24 08/18/2020 1133   GLUCOSE 92 08/18/2020 1133   GLUCOSE 104 (H) 12/13/2017 0533   BUN 12 08/18/2020 1133   CREATININE 0.77 08/18/2020 1133   CALCIUM 10.2 08/18/2020 1133   GFRNONAA 75 06/09/2019 0938   GFRAA 86 06/09/2019 0938   Clinical ASCVD: No  The 10-year ASCVD risk score (Arnett DK, et al., 2019) is: 1.9%   Values used to calculate the score:     Age: 55 years     Sex: Female     Is Non-Hispanic African American: Yes     Diabetic: No     Tobacco smoker: No     Systolic Blood Pressure: [id number] mmHg     Is BP treated: Yes     HDL Cholesterol: 100 mg/dL     Total Cholesterol: 207 mg/dL  A/P: Hypertension longstanding,  currently controlled on current medications. BP Goal = < 130/80 mmHg. Medication adherence denied, however patient did take amlodipine 2 hours before today's appointment. Discussed with patient how well controlled her BP can be when she takes her medications and further reiterated the importance of consistently taking her BP medications. Recommend patient utilize a pill pox in order to decrease her pill burden while she experiencing stressful changes in her personal life. Patient is agreeable to this.  -No changes to HTN regimen -Continued amlodipine 5 mg daily  -Counseled on lifestyle modifications for blood pressure control including reduced dietary sodium, increased exercise, adequate sleep.  Results reviewed and written information provided.   Total time in face-to-face counseling 15 minutes.   F/U Pharmacy Clinic Visit in 3 weeks.   Joseph Art, Pharm.D. PGY-1 Pharmacy Resident 02/09/2021 3:26 PM

## 2021-02-13 ENCOUNTER — Other Ambulatory Visit: Payer: Self-pay

## 2021-02-13 ENCOUNTER — Ambulatory Visit
Admission: RE | Admit: 2021-02-13 | Discharge: 2021-02-13 | Disposition: A | Discharge status: Home / Self Care | Payer: No Typology Code available for payment source | Source: Ambulatory Visit | Attending: Internal Medicine | Admitting: Internal Medicine

## 2021-02-13 DIAGNOSIS — Z1231 Encounter for screening mammogram for malignant neoplasm of breast: Secondary | ICD-10-CM

## 2021-02-14 ENCOUNTER — Ambulatory Visit: Payer: Medicaid Other

## 2021-02-16 ENCOUNTER — Other Ambulatory Visit: Payer: Self-pay

## 2021-02-26 ENCOUNTER — Other Ambulatory Visit: Payer: Self-pay

## 2021-02-26 ENCOUNTER — Ambulatory Visit: Payer: Self-pay | Attending: Internal Medicine

## 2021-03-08 ENCOUNTER — Other Ambulatory Visit: Payer: Self-pay | Admitting: Internal Medicine

## 2021-03-08 ENCOUNTER — Other Ambulatory Visit: Payer: Self-pay | Admitting: Obstetrics and Gynecology

## 2021-03-08 DIAGNOSIS — R928 Other abnormal and inconclusive findings on diagnostic imaging of breast: Secondary | ICD-10-CM

## 2021-03-09 ENCOUNTER — Other Ambulatory Visit: Payer: Self-pay

## 2021-03-09 ENCOUNTER — Ambulatory Visit: Payer: Self-pay | Attending: Internal Medicine | Admitting: Pharmacist

## 2021-03-09 VITALS — BP 123/78

## 2021-03-09 DIAGNOSIS — I10 Essential (primary) hypertension: Secondary | ICD-10-CM

## 2021-03-09 NOTE — Progress Notes (Signed)
   S:    PCP: Dr. Wynetta Emery  Patient arrives in good spirits.  Presents to the clinic for hypertension evaluation, counseling, and management. Patient was referred and last seen by Primary Care Provider on 01/15/2021. We saw her on 02/09/2021 and made no med changes as BP was at goal.  Medication adherence reported. She reports doing a better job of taking medications every day as instructed.    Current BP Medications include:  amlodipine 5 mg daily   Antihypertensives tried in the past include: None  Dietary habits: partially compliant with salt restriction. Denies excessive caffeine consumption.  Exercise habits: limited d/t leg/hip pain Family/social hx:  -Fhx: heart disease, stroke  -Tobacco: former smoker -Alcohol: none reported   O:  Vitals:   03/09/21 1503  BP: 123/78   Home BP readings: does not check at home and does not have a home BP monitor   Last 3 Office BP readings: BP Readings from Last 3 Encounters:  03/09/21 123/78  02/09/21 110/72  08/18/20 110/90   BMET    Component Value Date/Time   NA 141 08/18/2020 1133   K 4.4 08/18/2020 1133   CL 99 08/18/2020 1133   CO2 24 08/18/2020 1133   GLUCOSE 92 08/18/2020 1133   GLUCOSE 104 (H) 12/13/2017 0533   BUN 12 08/18/2020 1133   CREATININE 0.77 08/18/2020 1133   CALCIUM 10.2 08/18/2020 1133   GFRNONAA 75 06/09/2019 0938   GFRAA 86 06/09/2019 0938   Clinical ASCVD: No  The 10-year ASCVD risk score (Arnett DK, et al., 2019) is: 2.8%   Values used to calculate the score:     Age: 55 years     Sex: Female     Is Non-Hispanic African American: Yes     Diabetic: No     Tobacco smoker: No     Systolic Blood Pressure: 878 mmHg     Is BP treated: Yes     HDL Cholesterol: 100 mg/dL     Total Cholesterol: 207 mg/dL  A/P: Hypertension longstanding, currently controlled on current medications. BP Goal = < 130/80 mmHg. Medication adherence reported -No changes to HTN regimen -Continued amlodipine 5 mg daily   -Counseled on lifestyle modifications for blood pressure control including reduced dietary sodium, increased exercise, adequate sleep.  Results reviewed and written information provided.   Total time in face-to-face counseling 15 minutes.   F/U Pharmacy visit with PCP.   Benard Halsted, PharmD, [place], [place] [phone]

## 2021-03-12 ENCOUNTER — Telehealth: Payer: Self-pay

## 2021-03-12 NOTE — Telephone Encounter (Signed)
Telephoned patient at mobile number. Left a voice message with [facility] contact information. 

## 2021-03-16 ENCOUNTER — Ambulatory Visit: Payer: No Typology Code available for payment source

## 2021-03-19 ENCOUNTER — Ambulatory Visit (INDEPENDENT_AMBULATORY_CARE_PROVIDER_SITE_OTHER): Payer: No Payment, Other | Admitting: Clinical

## 2021-03-19 DIAGNOSIS — F314 Bipolar disorder, current episode depressed, severe, without psychotic features: Secondary | ICD-10-CM | POA: Diagnosis not present

## 2021-03-19 NOTE — Progress Notes (Signed)
THERAPIST PROGRESS NOTE Virtual Visit via Telephone Note  I connected with Victoria Snyder on 03/19/21 at  4:00 PM EDT by telephone and verified that I am speaking with the correct person using two identifiers.  Location: Patient: home Provider: office   I discussed the limitations, risks, security and privacy concerns of performing an evaluation and management service by telephone and the availability of in person appointments. I also discussed with the patient that there may be a patient responsible charge related to this service. The patient expressed understanding and agreed to proceed.   Follow Up Instructions: I discussed the assessment and treatment plan with the patient. The patient was provided an opportunity to ask questions and all were answered. The patient agreed with the plan and demonstrated an understanding of the instructions.   The patient was advised to call back or seek an in-person evaluation if the symptoms worsen or if the condition fails to improve as anticipated.   Session Time: 30 minutes  Participation Level: Active  Behavioral Response: CasualAlertDepressed  Type of Therapy: Individual Therapy  Treatment Goals addressed: Coping  Interventions: CBT and Supportive  Summary:  Victoria Snyder is a 55 y.o. female who presents oriented x5 and friendly. Client denies hallucinations and delusions. Client reported on today that she has been struggling with feelings of being overwhelmed, depressed, and irritability.  Client reported she has moved all of her things out of the home that she shared with her has been and is now staying with her friend.  Client reported she has reached out to the [employer] center, [facility] [facility] wellness, and disability to continue to get things sorted out that she needs.  Client reported she feels like she has been working on a lot of things but not getting anywhere.  Client reported her biggest stressor has been  her health.  Client reported she continues to have debilitating pain in her back and hips.  Client reported difficulty walking and standing.  Client reported she has spoken with the financial counselor at [facility] and wellness to apply for orange card so she can attempt to get medical treatment for her chronic pain but has to get tax information before she can go through that process.  Client reported she has difficulty with obtaining her medications for her physical health and mental health due to financial reasons. Client reported it has been over 4 years since she last worked due to physical and mental health reasons. Client reported she tried to reach out to her ex-husband to help get some things in order regarding her tax information.  She ultimately has had to do it by herself. Client reported she is staying with a good friend but there are days when she has "episodes" that is affecting her friendship.  Client reported she has depressive episodes when she lays in bed not only because of her mood but because of being in pain and then their movements where she is verbally aggressive.  Client reported her past way of dealing with her anger was messing up things in her house when she was staying with her husband but she was told by her friend that if she does that in her current living situation she wants to find somewhere else to stay.  Client reported she does have positive family support but she does not like being a burden on other people.  Client reported she is overwhelmed by all the things that she needs to get done  and at times wants to give up but she is trying to stay focused.   Suicidal/Homicidal: Nowithout intent/plan  Therapist Response:  Therapist began the appointment asking client how she has been doing since last seen. Therapist used CBT to utilize active listening and positive emotional support towards client's thoughts and feelings. Therapist used CBT to ask the client to  identify the biopsychosocial needs that she is having difficulty obtaining which are affecting her mental health symptoms. Therapist used CBT to engage with the client to ask her to describe her behavioral episodes that are negatively impacting her positive supports. Therapist used CBT to normalize the clients emotions to stressors. Therapist used CBT to engage the client to reframe her negative thinking and problem-solving skills to make her efforts to feel more productive. Therapist assigned client homework to label folders to keep her papers organized, call the financial counselor and obtain her paperwork to receive her orange card. Cient was scheduled for next appointment.      Plan: Return again in 4 weeks.  Diagnosis: Bipolar disorder, current episode depressed severe without psychotic features Victoria Jubilee Jonnelle Lawniczak, LCSW 03/19/2021

## 2021-03-22 ENCOUNTER — Telehealth (HOSPITAL_COMMUNITY): Payer: No Payment, Other | Admitting: Psychiatry

## 2021-03-22 NOTE — Progress Notes (Unsigned)
[facility] MD/PA/NP OP Progress Note Virtual Visit via Video Note  I connected with Victoria Snyder on 12/15/20 at 10:30 AM EDT by a video enabled telemedicine application and verified that I am speaking with the correct person using two identifiers.  Location: Patient: Home Provider: Clinic   I discussed the limitations of evaluation and management by telemedicine and the availability of in person appointments. The patient expressed understanding and agreed to proceed.  I provided 30 minutes of non-face-to-face time during this encounter.      12/15/2020 11:28 AM Victoria Snyder  MRN:  [id number]  Chief Complaint:  "I've had pain in my left hip and I am more depresses"   HPI: 55 year old female seen today for follow up psychiatric evaluation. She has a  psychiatric history of Bipolar 2, anxiety, and depression. She is currently being managed on Seroquel 250 mg nighlty,  Seroquel 50 mg daily, Cymbalta 40 twice daily, Buspar 15 mg three times daily, and Hydroxyzine 25 mg three times daily as needed. She notes that she ran out of her medications and recently restarted them 2-3 weeks ago.   Today she is pleasant, cooperative, maintained eye contact, and engaged in conversation.  She informed provider that she is in the process of movimg out of her home and into her sisters home. She notes that she and her husband are separating. She informed Probation officer that he gave her a hard time when she attempted to move her things but reports that her sisters stepped in to assist. Patient notes that since being off of her medications she has been more depressed. She also notes that her marital stressors worsen her anxiety and depression. Today provider conducted a PHQ 9 and patient scored a 24, at her last visit she scored a 12. Provider also conducted a GAD 7 and patient scored a 19, at her last visit she scored a 16. She notes that she is worried about her health. Patient notes that she has been having severe left hip  pain. She notes that it make it difficult to walk at times and reports that she has caught her self from falling on different occassions. She notes she plans to follow up with her PCP soon.   She endorses adequate sleep and appetite. Today she denies SI/HI/VAH, mania, or paranoia.   Today no medication changes made. She is agreeable to continue all medications as prescribed and follow up with outpatient counseling for therapy.  No other concerns noted at this time.     Visit Diagnosis:    ICD-10-CM   1. Anxiety and depression  F41.9 busPIRone (BUSPAR) 15 MG tablet   F32.A DULoxetine HCl 40 MG CPEP    hydrOXYzine (ATARAX/VISTARIL) 10 MG tablet    2. Low back pain with radiation  M54.50 DULoxetine HCl 40 MG CPEP    3. Bipolar disorder, current episode depressed, severe, without psychotic features ([place])  F31.4 QUEtiapine (SEROQUEL) 200 MG tablet    QUEtiapine (SEROQUEL) 50 MG tablet    traZODone (DESYREL) 100 MG tablet      Past Psychiatric History: anxiety, depression, insomnia, and bipolar 2 disorder. Past Medical History:  Past Medical History:  Diagnosis Date   Bipolar disorder ([place])    Blood transfusion without reported diagnosis    Hypertension    Hypokalemia    Low back pain with radiation    Vertigo     Past Surgical History:  Procedure Laterality Date   ABDOMINAL HYSTERECTOMY  Family Psychiatric History: Notes that her half brother had a mental illness however she is unaware of what it was.     Family History:  Family History  Problem Relation Age of Onset   Heart disease Mother        heart attack   Stroke Father     Social History:  Social History   Socioeconomic History   Marital status: Married    Spouse name: Not on file   Number of children: 1   Years of education: Not on file   Highest education level: 12th grade  Occupational History   Occupation: Unemployed  Tobacco Use   Smoking status: Former   Smokeless tobacco: Never  Brewing technologist Use: Never used  Substance and Sexual Activity   Alcohol use: Yes    Comment: Drinks occasionally   Drug use: No   Sexual activity: Yes  Other Topics Concern   Not on file  Social History Narrative   Not on file   Social Determinants of Health   Financial Resource Strain: Not on file  Food Insecurity: Not on file  Transportation Needs: Not on file  Physical Activity: Not on file  Stress: Not on file  Social Connections: Not on file    Allergies: No Known Allergies  Metabolic Disorder Labs: Lab Results  Component Value Date   HGBA1C 4.9 06/09/2019   No results found for: PROLACTIN Lab Results  Component Value Date   CHOL 207 (H) 08/18/2020   TRIG 76 08/18/2020   HDL 100 08/18/2020   CHOLHDL 2.1 08/18/2020   LDLCALC 94 08/18/2020   LDLCALC 99 10/22/2017   Lab Results  Component Value Date   TSH 1.840 10/22/2017    Therapeutic Level Labs: No results found for: LITHIUM No results found for: VALPROATE No components found for:  CBMZ  Current Medications: Current Outpatient Medications  Medication Sig Dispense Refill   amLODipine (NORVASC) 5 MG tablet TAKE 1 TABLET (5 MG TOTAL) BY MOUTH DAILY. TO LOWER BLOOD PRESSURE 30 tablet 3   Ascorbic Acid (VITAMIN C) 1000 MG tablet Take 1,000 mg by mouth daily.     busPIRone (BUSPAR) 15 MG tablet TAKE 1 TABLET (15 MG TOTAL) BY MOUTH 3 (THREE) TIMES DAILY. 90 tablet 3   celecoxib (CELEBREX) 200 MG capsule TAKE 1 CAPSULE (200 MG TOTAL) BY MOUTH DAILY. STOP IBUPROFEN 30 capsule 6   diclofenac sodium (VOLTAREN) 1 % GEL Apply 4 g topically 4 (four) times daily. To help with knee pain 1 Tube 5   DULoxetine HCl 40 MG CPEP Take 40 mg by mouth 2 (two) times daily at 10 AM and 5 PM. 60 capsule 3   hydrOXYzine (ATARAX/VISTARIL) 10 MG tablet TAKE 1 TABLET (10 MG TOTAL) BY MOUTH 3 (THREE) TIMES DAILY AS NEEDED FOR ANXIETY. 90 tablet 3   meclizine (ANTIVERT) 25 MG tablet Take 1 tablet (25 mg total) by mouth 3 (three) times daily as  needed for dizziness. 30 tablet 3   Multiple Vitamins-Minerals (MULTIVITAMIN WITH MINERALS) tablet Take 1 tablet by mouth daily.     QUEtiapine (SEROQUEL) 200 MG tablet TAKE 1 TABLET (200 MG TOTAL) BY MOUTH AT BEDTIME. 30 tablet 3   QUEtiapine (SEROQUEL) 50 MG tablet TAKE 1 TABLET (50 MG TOTAL) BY MOUTH AT BEDTIME. 30 tablet 3   traZODone (DESYREL) 100 MG tablet TAKE 1 TABLET (100 MG TOTAL) BY MOUTH AT BEDTIME AS NEEDED FOR SLEEP. 30 tablet 3   No current facility-administered medications  for this visit.     Musculoskeletal: Strength & Muscle Tone:  Unable to assess due to telehealth visit [place]:  Unable to assess due to telehealth visit Patient leans: N/A  Psychiatric Specialty Exam: Review of Systems  There were no vitals taken for this visit.There is no height or weight on file to calculate BMI.  General Appearance: Well Groomed  Eye Contact:  Good  Speech:  Clear and Coherent and Normal Rate  Volume:  Normal  Mood:  Anxious and Depressed  Affect:  Congruent  Thought Process:  Coherent, Goal Directed and Linear  Orientation:  Full (Time, Place, and Person)  Thought Content: WDL and Logical   Suicidal Thoughts:  No  Homicidal Thoughts:  No  Memory:  Immediate;   Good Recent;   Good Remote;   Good  Judgement:  Good  Insight:  Good  Psychomotor Activity:  Normal  Concentration:  Concentration: Good and Attention Span: Good  Recall:  Good  Fund of Knowledge: Good  Language: Good  Akathisia:  No  Handed:  Right  AIMS (if indicated): Not done  Assets:  Communication Skills Desire for Improvement Financial Resources/Insurance Housing Social Support  ADL's:  Intact  Cognition: WNL  Sleep:  Good   Screenings: GAD-7    Flowsheet Row Video Visit from 12/15/2020 in [facility] from 10/13/2020 in [facility] Video Visit from 07/11/2020 in [facility] Video Visit from  05/12/2020 in [facility] from 03/13/2020 in [facility]  Total GAD-7 Score 19 16 16 20 20       PHQ2-9    Flowsheet Row Video Visit from 12/15/2020 in [facility] from 10/13/2020 in [facility] Video Visit from 07/11/2020 in [facility] Video Visit from 05/12/2020 in [facility] from 03/13/2020 in [facility]  PHQ-2 Total Score 6 4 3 6 5   PHQ-9 Total Score 24 12 18 23 17       Flowsheet Row Video Visit from 12/15/2020 in [facility] Video Visit from 07/11/2020 in [facility] Error: Q7 should not be populated when Q6 is No Error: Q7 should not be populated when Q6 is No        Assessment and Plan: Patient endorses symptoms of anxiety and depression due to marital stressors and pain. She notes that she restarted her medications 2-3 weeks ago and request no adjustments at this time. No medication changes made today. Patient agreeable to continue medications as prescribed.     1. Anxiety and depression  Continue- busPIRone (BUSPAR) 15 MG tablet; Take 1 tablet (15 mg total) by mouth 3 (three) times daily.  Dispense: 90 tablet; Refill: 2 Continue- hydrOXYzine (ATARAX/VISTARIL) 10 MG tablet; Take 1 tablet (10 mg total) by mouth 3 (three) times daily as needed for anxiety.  Dispense: 90 tablet; Refill: 2  2. Low back pain with radiation  Continue- DULoxetine 40 MG CPEP; Take 20 mg by mouth 2 (two) times daily.  Dispense: 60 capsule; Refill: 2  3. Bipolar disorder, current episode depressed, severe, without psychotic features ([place])  Continue- QUEtiapine (SEROQUEL) 200 MG tablet; Take 1 tablet (200 mg total) by mouth at bedtime.  Dispense: 30 tablet; Refill: 2 Continue-  QUEtiapine (SEROQUEL) 50 MG tablet; Take 1 tablet (50  mg total) by mouth at bedtime.  Dispense: 30 tablet; Refill: 2 Continue- traZODone (DESYREL) 100 MG tablet; Take 1 tablet (100 mg total) by mouth at bedtime as needed for sleep.  Dispense: 30 tablet; Refill: 2    Follow-up in 80months Follow-up with therapy   Salley Slaughter, NP 12/15/2020, 11:28 AM

## 2021-03-29 ENCOUNTER — Telehealth: Payer: Self-pay | Admitting: Internal Medicine

## 2021-03-29 NOTE — Telephone Encounter (Signed)
I return Pt call, LVM to call us back

## 2021-03-29 NOTE — Telephone Encounter (Signed)
Pt is calling to schedule Appt with Clifton James

## 2021-04-02 ENCOUNTER — Other Ambulatory Visit: Payer: Self-pay

## 2021-04-09 ENCOUNTER — Ambulatory Visit (HOSPITAL_COMMUNITY): Payer: No Payment, Other | Admitting: Clinical

## 2021-04-11 ENCOUNTER — Ambulatory Visit: Payer: Self-pay | Attending: Internal Medicine

## 2021-04-11 ENCOUNTER — Other Ambulatory Visit: Payer: Self-pay

## 2021-05-03 ENCOUNTER — Other Ambulatory Visit: Payer: Self-pay

## 2021-05-03 ENCOUNTER — Ambulatory Visit: Payer: Self-pay | Admitting: *Deleted

## 2021-05-03 ENCOUNTER — Ambulatory Visit
Admission: RE | Admit: 2021-05-03 | Discharge: 2021-05-03 | Disposition: A | Discharge status: Home / Self Care | Payer: No Typology Code available for payment source | Source: Ambulatory Visit | Attending: Obstetrics and Gynecology | Admitting: Obstetrics and Gynecology

## 2021-05-03 ENCOUNTER — Other Ambulatory Visit: Payer: Self-pay | Admitting: Obstetrics and Gynecology

## 2021-05-03 VITALS — BP 138/88 | Wt 164.8 lb

## 2021-05-03 DIAGNOSIS — R928 Other abnormal and inconclusive findings on diagnostic imaging of breast: Secondary | ICD-10-CM

## 2021-05-03 DIAGNOSIS — Z1239 Encounter for other screening for malignant neoplasm of breast: Secondary | ICD-10-CM

## 2021-05-03 DIAGNOSIS — N6320 Unspecified lump in the left breast, unspecified quadrant: Secondary | ICD-10-CM

## 2021-05-03 NOTE — Patient Instructions (Signed)
Explained breast self awareness with [name]. Patient did not need a Pap smear today due to patient has a history of a hysterectomy for benign reasons. Let patient know that she doesn't need any further Pap smears due to her history of a hysterectomy for benign reasons. Referred patient to the [facility] for a left breast diagnostic mammogram per recommendation. Appointment scheduled Thursday, May 03, 2021 at 1400. Patient aware of appointment and will be there. [name] verbalized understanding.  Hideo Googe, Arvil Chaco, RN 11:17 AM

## 2021-05-03 NOTE — Progress Notes (Signed)
Victoria Snyder is a 55 y.o. female who presents to [facility] clinic today with no complaints. Patient referred to [facility] by the [facility] due to having a screening mammogram completed 02/13/2021 that additional imaging of the left breast is recommended for follow-up.   Pap Smear: Pap smear not completed today. Last Pap smear was 03/27/2017 at [facility] clinic and was normal with negative HPV. Per patient has no history of an abnormal Pap smear. Patient has a history of a hysterectomy 10 years ago due to fibroids and AUB. Patient doesn't need any further Pap smears due to there history of a hysterectomy for benign reasons per BCCCP and ASCCP guidelines. Last Pap smear result is available in Epic.   Physical exam: Breasts Breasts symmetrical. No skin abnormalities bilateral breasts. No nipple retraction bilateral breasts. No nipple discharge bilateral breasts. No lymphadenopathy. No lumps palpated bilateral breasts. Patient has a lump on the skin located between bilateral breasts. Patient stated the lump has been there for around 20 years and that it has been lanced in the past by her PCP. No complaints of pain or tenderness on exam.      MM 3D SCREEN BREAST BILATERAL  Result Date: 03/07/2021 CLINICAL DATA:  Screening. EXAM: DIGITAL SCREENING BILATERAL MAMMOGRAM WITH TOMOSYNTHESIS AND CAD TECHNIQUE: Bilateral screening digital craniocaudal and mediolateral oblique mammograms were obtained. Bilateral screening digital breast tomosynthesis was performed. The images were evaluated with computer-aided detection. COMPARISON:  Previous exam(s). There are no previous left mammogram images available for comparison. ACR Breast Density Category b: There are scattered areas of fibroglandular density. FINDINGS: In the left breast, a possible mass in the 6 o'clock position warrants further evaluation. In the right breast, no findings suspicious for malignancy. IMPRESSION:  Further evaluation is suggested for a possible mass in the left breast. RECOMMENDATION: Diagnostic mammogram and possibly ultrasound of the left breast. (Code:[id number]) The patient will be contacted regarding the findings, and additional imaging will be scheduled. BI-RADS CATEGORY  0: Incomplete. Need additional imaging evaluation and/or prior mammograms for comparison. Electronically Signed   By: Claudie Revering M.D.   On: 03/07/2021 12:30    Pelvic/Bimanual Pap is not indicated today per BCCCP guidelines.   Smoking History: Patient is a former smoker that quit in 2001.   Patient Navigation: Patient education provided. Access to services provided for patient through [facility] program.  Colorectal Cancer Screening: Per patient has never had colonoscopy completed. Patient was given a FIT Test by her PCP on 08/18/2020 to complete. No complaints today.    Breast and Cervical Cancer Risk Assessment: Patient does not have family history of breast cancer, known genetic mutations, or radiation treatment to the chest before age 64. Patient does not have history of cervical dysplasia, immunocompromised, or DES exposure in-utero.  Risk Assessment     Risk Scores       05/03/2021   Last edited by: Royston Bake, CMA   5-year risk: 1.4 %   Lifetime risk: 7.7 %            A: BCCCP exam without pap smear No complaints.  P: Referred patient to the [facility] for a left breast diagnostic mammogram per recommendation. Appointment scheduled Thursday, May 03, 2021 at 1400.  Loletta Parish, RN 05/03/2021 11:17 AM

## 2021-05-15 ENCOUNTER — Ambulatory Visit
Admission: RE | Admit: 2021-05-15 | Discharge: 2021-05-15 | Disposition: A | Discharge status: Home / Self Care | Payer: No Typology Code available for payment source | Source: Ambulatory Visit | Attending: Obstetrics and Gynecology | Admitting: Obstetrics and Gynecology

## 2021-05-15 DIAGNOSIS — N6320 Unspecified lump in the left breast, unspecified quadrant: Secondary | ICD-10-CM

## 2021-05-15 HISTORY — PX: BREAST BIOPSY: SHX20

## 2021-05-17 ENCOUNTER — Other Ambulatory Visit: Payer: Self-pay

## 2021-05-17 ENCOUNTER — Ambulatory Visit (INDEPENDENT_AMBULATORY_CARE_PROVIDER_SITE_OTHER): Payer: No Payment, Other | Admitting: Clinical

## 2021-05-17 ENCOUNTER — Ambulatory Visit: Payer: Medicaid Other | Attending: Physician Assistant | Admitting: Physician Assistant

## 2021-05-17 ENCOUNTER — Encounter: Payer: Self-pay | Admitting: Physician Assistant

## 2021-05-17 VITALS — BP 124/88 | HR 99 | Ht 67.0 in | Wt 167.4 lb

## 2021-05-17 DIAGNOSIS — F314 Bipolar disorder, current episode depressed, severe, without psychotic features: Secondary | ICD-10-CM | POA: Diagnosis not present

## 2021-05-17 DIAGNOSIS — M545 Low back pain, unspecified: Secondary | ICD-10-CM

## 2021-05-17 DIAGNOSIS — Z1331 Encounter for screening for depression: Secondary | ICD-10-CM

## 2021-05-17 DIAGNOSIS — M1611 Unilateral primary osteoarthritis, right hip: Secondary | ICD-10-CM

## 2021-05-17 DIAGNOSIS — M5136 Other intervertebral disc degeneration, lumbar region: Secondary | ICD-10-CM

## 2021-05-17 MED ORDER — GABAPENTIN 300 MG PO CAPS
300.0000 mg | ORAL_CAPSULE | Freq: Three times a day (TID) | ORAL | 3 refills | Status: DC
  Filled 2021-05-17: qty 90, 30d supply, fill #0

## 2021-05-17 MED ORDER — CELECOXIB 200 MG PO CAPS
ORAL_CAPSULE | ORAL | 6 refills | Status: DC
  Filled 2021-05-17: qty 30, 30d supply, fill #0

## 2021-05-17 NOTE — Progress Notes (Signed)
Patient ID: KAHLEE METIVIER, female   DOB: [birth year], 55 y.o.   MRN: [id number]   Victoria Snyder, is a 55 y.o. female  OAC:[id number]  WFU:[id number]  DOB - 1965/11/21  Chief Complaint  Patient presents with   Back Pain       Subjective:   Victoria Snyder is a 55 y.o. female here today for chronic back and body pains.  Poor historian.  Gabapentin and celebrex did help but she is not taking either.   +PHQ9; #9=1.  Denies plan or intent.  Has fleeting thoughts-most all related to pain.  Also separated from husband.  Protective factors=6 grandkids that she is very close to.  No weapons in the home.  Has a therapist and sees her today.  +GAD7.    No problems updated.  ALLERGIES: No Known Allergies  PAST MEDICAL HISTORY: Past Medical History:  Diagnosis Date   Bipolar disorder ([place])    Blood transfusion without reported diagnosis    Hypertension    Hypokalemia    Low back pain with radiation    Vertigo     MEDICATIONS AT HOME: Prior to Admission medications   Medication Sig Start Date End Date Taking? Authorizing Provider  amLODipine (NORVASC) 5 MG tablet TAKE 1 TABLET (5 MG TOTAL) BY MOUTH DAILY. TO LOWER BLOOD PRESSURE 01/15/21 01/15/22 Yes Ladell Pier, MD  Ascorbic Acid (VITAMIN C) 1000 MG tablet Take 1,000 mg by mouth daily.   Yes [provider]  busPIRone (BUSPAR) 15 MG tablet TAKE 1 TABLET (15 MG TOTAL) BY MOUTH 3 (THREE) TIMES DAILY. 12/15/20 12/15/21 Yes Eulis Canner E, NP  diclofenac sodium (VOLTAREN) 1 % GEL Apply 4 g topically 4 (four) times daily. To help with knee pain 07/27/18  Yes Fulp, Cammie, MD  DULoxetine HCl 40 MG CPEP Take 40 mg by mouth 2 (two) times daily at 10 AM and 5 PM. 12/15/20  Yes Eulis Canner E, NP  hydrOXYzine (ATARAX/VISTARIL) 10 MG tablet TAKE 1 TABLET (10 MG TOTAL) BY MOUTH 3 (THREE) TIMES DAILY AS NEEDED FOR ANXIETY. 12/15/20 12/15/21 Yes Eulis Canner E, NP  meclizine (ANTIVERT) 25 MG tablet Take 1 tablet (25 mg total) by  mouth 3 (three) times daily as needed for dizziness. 02/27/18  Yes Fulp, Cammie, MD  Multiple Vitamins-Minerals (MULTIVITAMIN WITH MINERALS) tablet Take 1 tablet by mouth daily.   Yes [provider]  QUEtiapine (SEROQUEL) 200 MG tablet TAKE 1 TABLET (200 MG TOTAL) BY MOUTH AT BEDTIME. 12/15/20 12/15/21 Yes Eulis Canner E, NP  QUEtiapine (SEROQUEL) 50 MG tablet TAKE 1 TABLET (50 MG TOTAL) BY MOUTH AT BEDTIME. 12/15/20  Yes Eulis Canner E, NP  traZODone (DESYREL) 100 MG tablet TAKE 1 TABLET (100 MG TOTAL) BY MOUTH AT BEDTIME AS NEEDED FOR SLEEP. 12/15/20  Yes Eulis Canner E, NP  celecoxib (CELEBREX) 200 MG capsule TAKE 1 CAPSULE (200 MG TOTAL) BY MOUTH DAILY. STOP IBUPROFEN 05/17/21 05/17/22  Argentina Donovan, PA-C  gabapentin (NEURONTIN) 300 MG capsule Take 1 capsule (300 mg total) by mouth 3 (three) times daily. To help with back pain with radiation 05/17/21   Argentina Donovan, PA-C    ROS: Neg HEENT Neg resp Neg cardiac Neg GI Neg GU See above for psych Neg neuro  Objective:   Vitals:   05/17/21 0931  BP: 124/88  Pulse: 99  SpO2: 91%  Weight: 167 lb 6 oz (75.9 kg)  Height: 5\' 7"  (1.702 m)   Exam General appearance : Awake, alert, not in  any distress. Speech Clear. Not toxic looking HEENT: Atraumatic and Normocephalic, pupils equally reactive to light and accomodation Neck: Supple, no JVD. No cervical lymphadenopathy.  Chest: Good air entry bilaterally, CTAB.  No rales/rhonchi/wheezing CVS: S1 S2 regular, no murmurs.  Back-neg SLR B.  DTR=B Extremities: B/L Lower Ext shows no edema, both legs are warm to touch Neurology: Awake alert, and oriented X 3, CN II-XII intact, Non focal Flat affect Skin: No Rash  Data Review Lab Results  Component Value Date   HGBA1C 4.9 06/09/2019   HGBA1C 5.2 10/22/2017    Assessment & Plan   1. Low back pain with radiation No red flags-resume meds-these helped previously - gabapentin (NEURONTIN) 300 MG capsule; Take  1 capsule (300 mg total) by mouth 3 (three) times daily. To help with back pain with radiation  Dispense: 90 capsule; Refill: 3 - celecoxib (CELEBREX) 200 MG capsule; TAKE 1 CAPSULE (200 MG TOTAL) BY MOUTH DAILY. STOP IBUPROFEN  Dispense: 30 capsule; Refill: 6 - Ambulatory referral to Pain Clinic  2. Primary osteoarthritis of right hip - gabapentin (NEURONTIN) 300 MG capsule; Take 1 capsule (300 mg total) by mouth 3 (three) times daily. To help with back pain with radiation  Dispense: 90 capsule; Refill: 3 - celecoxib (CELEBREX) 200 MG capsule; TAKE 1 CAPSULE (200 MG TOTAL) BY MOUTH DAILY. STOP IBUPROFEN  Dispense: 30 capsule; Refill: 6 - Ambulatory referral to Pain Clinic  3. DDD (degenerative disc disease), lumbar - gabapentin (NEURONTIN) 300 MG capsule; Take 1 capsule (300 mg total) by mouth 3 (three) times daily. To help with back pain with radiation  Dispense: 90 capsule; Refill: 3 - celecoxib (CELEBREX) 200 MG capsule; TAKE 1 CAPSULE (200 MG TOTAL) BY MOUTH DAILY. STOP IBUPROFEN  Dispense: 30 capsule; Refill: 6 - Ambulatory referral to Pain Clinic  4. Lumbar degenerative disc disease - gabapentin (NEURONTIN) 300 MG capsule; Take 1 capsule (300 mg total) by mouth 3 (three) times daily. To help with back pain with radiation  Dispense: 90 capsule; Refill: 3 - celecoxib (CELEBREX) 200 MG capsule; TAKE 1 CAPSULE (200 MG TOTAL) BY MOUTH DAILY. STOP IBUPROFEN  Dispense: 30 capsule; Refill: 6 - Ambulatory referral to Pain Clinic  5. Positive depression screening Counseled on self-care.  Says she will not harm herself.  Has protective factors.  Sees counselor today and has appt with psychiatrist in a few weeks.  She says she will feel better mentally once the pain improves on meds.     Patient have been counseled extensively about nutrition and exercise. Other issues discussed during this visit include: low cholesterol diet, weight control and daily exercise, foot care, annual eye examinations  at Ophthalmology, importance of adherence with medications and regular follow-up. We also discussed long term complications of uncontrolled diabetes and hypertension.   Return in about 6 weeks (around 06/28/2021) for 6 weeks-chronic conditions.  The patient was given clear instructions to go to ER or return to medical center if symptoms don't improve, worsen or new problems develop. The patient verbalized understanding. The patient was told to call to get lab results if they haven't heard anything in the next week.      Freeman Caldron, PA-C [facility] and [facility], [place]   05/17/2021, 9:59 AM
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
## 2021-05-17 NOTE — Progress Notes (Signed)
Pain that starts in back and radiates to legs. Pain get worse at night especially when pt lies down.

## 2021-06-01 ENCOUNTER — Telehealth (INDEPENDENT_AMBULATORY_CARE_PROVIDER_SITE_OTHER): Payer: No Payment, Other | Admitting: Psychiatry

## 2021-06-01 ENCOUNTER — Encounter: Payer: Self-pay | Admitting: Physical Medicine & Rehabilitation

## 2021-06-01 ENCOUNTER — Encounter (HOSPITAL_COMMUNITY): Payer: Self-pay | Admitting: Psychiatry

## 2021-06-01 ENCOUNTER — Other Ambulatory Visit: Payer: Self-pay

## 2021-06-01 DIAGNOSIS — M545 Low back pain, unspecified: Secondary | ICD-10-CM

## 2021-06-01 DIAGNOSIS — F32A Depression, unspecified: Secondary | ICD-10-CM | POA: Diagnosis not present

## 2021-06-01 DIAGNOSIS — F314 Bipolar disorder, current episode depressed, severe, without psychotic features: Secondary | ICD-10-CM | POA: Diagnosis not present

## 2021-06-01 DIAGNOSIS — F419 Anxiety disorder, unspecified: Secondary | ICD-10-CM | POA: Diagnosis not present

## 2021-06-01 MED ORDER — HYDROXYZINE HCL 10 MG PO TABS
ORAL_TABLET | ORAL | 3 refills | Status: DC
  Filled 2021-06-01: qty 90, 30d supply, fill #0

## 2021-06-01 MED ORDER — QUETIAPINE FUMARATE 50 MG PO TABS
ORAL_TABLET | Freq: Every day | ORAL | 3 refills | Status: DC
  Filled 2021-06-01: qty 30, 30d supply, fill #0

## 2021-06-01 MED ORDER — BUSPIRONE HCL 15 MG PO TABS
ORAL_TABLET | ORAL | 3 refills | Status: DC
  Filled 2021-06-01: qty 90, 30d supply, fill #0

## 2021-06-01 MED ORDER — TRAZODONE HCL 100 MG PO TABS
ORAL_TABLET | Freq: Every evening | ORAL | 3 refills | Status: DC | PRN
  Filled 2021-06-01 – 2021-08-10 (×2): qty 30, 30d supply, fill #0

## 2021-06-01 MED ORDER — QUETIAPINE FUMARATE 300 MG PO TABS
300.0000 mg | ORAL_TABLET | Freq: Every day | ORAL | 3 refills | Status: DC
  Filled 2021-06-01 – 2021-08-10 (×2): qty 30, 30d supply, fill #0

## 2021-06-01 MED ORDER — DULOXETINE HCL 40 MG PO CPEP
40.0000 mg | ORAL_CAPSULE | Freq: Two times a day (BID) | ORAL | 3 refills | Status: DC
Start: 2021-06-01 — End: 2021-08-23
  Filled 2021-06-01 – 2021-08-10 (×2): qty 60, 30d supply, fill #0
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
## 2021-06-02 ENCOUNTER — Other Ambulatory Visit: Payer: Self-pay

## 2021-06-04 ENCOUNTER — Encounter: Payer: Self-pay | Admitting: Family Medicine

## 2021-06-04 ENCOUNTER — Other Ambulatory Visit: Payer: Self-pay

## 2021-06-04 ENCOUNTER — Ambulatory Visit (INDEPENDENT_AMBULATORY_CARE_PROVIDER_SITE_OTHER): Payer: Self-pay | Admitting: Family Medicine

## 2021-06-04 VITALS — BP 130/84 | HR 81 | Temp 98.4°F | Ht 67.0 in | Wt 162.5 lb

## 2021-06-04 DIAGNOSIS — G8929 Other chronic pain: Secondary | ICD-10-CM

## 2021-06-04 DIAGNOSIS — M25551 Pain in right hip: Secondary | ICD-10-CM

## 2021-06-04 DIAGNOSIS — F314 Bipolar disorder, current episode depressed, severe, without psychotic features: Secondary | ICD-10-CM

## 2021-06-04 DIAGNOSIS — Z79899 Other long term (current) drug therapy: Secondary | ICD-10-CM

## 2021-06-04 DIAGNOSIS — M5136 Other intervertebral disc degeneration, lumbar region: Secondary | ICD-10-CM

## 2021-06-04 DIAGNOSIS — Z23 Encounter for immunization: Secondary | ICD-10-CM

## 2021-06-04 DIAGNOSIS — I10 Essential (primary) hypertension: Secondary | ICD-10-CM

## 2021-06-04 LAB — LIPID PANEL
Cholesterol: 193 mg/dL (ref 0–200)
HDL: 64.6 mg/dL (ref >39.00)
LDL Cholesterol: 105 mg/dL — ABNORMAL HIGH (ref 0–99)
NonHDL: 128.66
Total CHOL/HDL Ratio: 3
Triglycerides: 118 mg/dL (ref 0.0–149.0)
VLDL: 23.6 mg/dL (ref 0.0–40.0)

## 2021-06-04 LAB — COMPREHENSIVE METABOLIC PANEL
ALT: 22 U/L (ref 0–35)
AST: 21 U/L (ref 0–37)
Albumin: 4.7 g/dL (ref 3.5–5.2)
Alkaline Phosphatase: 89 U/L (ref 39–117)
BUN: 12 mg/dL (ref 6–23)
CO2: 29 mEq/L (ref 19–32)
Calcium: 10 mg/dL (ref 8.4–10.5)
Chloride: 104 mEq/L (ref 96–112)
Creatinine, Ser: 0.84 mg/dL (ref 0.40–1.20)
GFR: 77.56 mL/min (ref >60.00)
Glucose, Bld: 90 mg/dL (ref 70–99)
Potassium: 4.3 mEq/L (ref 3.5–5.1)
Sodium: 142 mEq/L (ref 135–145)
Total Bilirubin: 0.7 mg/dL (ref 0.2–1.2)
Total Protein: 7.3 g/dL (ref 6.0–8.3)

## 2021-06-04 LAB — CBC
HCT: 41.8 % (ref 36.0–46.0)
Hemoglobin: 14.5 g/dL (ref 12.0–15.0)
MCHC: 34.8 g/dL (ref 30.0–36.0)
MCV: 91 fl (ref 78.0–100.0)
Platelets: 374 10*3/uL (ref 150.0–400.0)
RBC: 4.59 Mil/uL (ref 3.87–5.11)
RDW: 13.1 % (ref 11.5–15.5)
WBC: 4.9 10*3/uL (ref 4.0–10.5)

## 2021-06-04 LAB — TSH: TSH: 1.04 u[IU]/mL (ref 0.35–5.50)

## 2021-06-04 NOTE — Patient Instructions (Signed)
Welcome to [facility] at [facility] [place] [facility]! It was a pleasure meeting you today.  As discussed, Please schedule a 6 month follow up visit today.  PLEASE NOTE:  If you had any LAB tests please let us know if you have not heard back within a few days. You may see your results on MyChart before we have a chance to review them but we will give you a call once they are reviewed by us. If we ordered any REFERRALS today, please let us know if you have not heard from their office within the next week.  Let us know through MyChart if you are needing REFILLS, or have your pharmacy send us the request. You can also use MyChart to communicate with me or any office staff.  Please try these tips to maintain a healthy lifestyle:  Eat most of your calories during the day when you are active. Eliminate processed foods including packaged sweets (pies, cakes, cookies), reduce intake of potatoes, white bread, white pasta, and white rice. Look for whole grain options, oat flour or almond flour.  Each meal should contain half fruits/vegetables, one quarter protein, and one quarter carbs (no bigger than a computer mouse).  Cut down on sweet beverages. This includes juice, soda, and sweet tea. Also watch fruit intake, though this is a healthier sweet option, it still contains natural sugar! Limit to 3 servings daily.  Drink at least 1 glass of water with each meal and aim for at least 8 glasses per day  Exercise at least 150 minutes every week.   

## 2021-06-04 NOTE — Progress Notes (Signed)
New Patient Office Visit  Subjective:  Patient ID: Victoria Snyder, female    DOB: [birth year]  Age: 56 y.o. MRN: [id number]  CC:  Chief Complaint  Patient presents with   Establish Care    HPI Victoria Snyder presents for new pt to establish. Was going to [employer]. [facility]-Bipolar-going thru a lot-separated x 17mo.  Seeing MH. Depressed. No SI.  "Homeless" but staying w/friends. Pain-R hip and "cramps" if sits too long and throbs all down leg.  Chronic LBP for many yrs and intermitt "goes out".  Taking muscle relaxors and helped acute episode.  Saw ortho once "when had insurance" and got injection once-lasted 2 mo.  1 session of PT.   No n/t. No weakness.  Just the pain and will "catch" at times when changes positions  HTN-taking amlodipine. Not checking. No ha/dizziness/cp/sob.  Occ edema  Past Medical History:  Diagnosis Date   Bipolar disorder ([place])    Blood transfusion without reported diagnosis    Hypertension    Hypokalemia    Low back pain with radiation    Vertigo     Past Surgical History:  Procedure Laterality Date   ABDOMINAL HYSTERECTOMY     partial for fibroids    Family History  Problem Relation Age of Onset   Heart disease Mother        heart attack   Stroke Father    Breast cancer Neg Hx     Social History   Socioeconomic History   Marital status: Married    Spouse name: Not on file   Number of children: 1   Years of education: Not on file   Highest education level: 12th grade  Occupational History   Occupation: Unemployed  Tobacco Use   Smoking status: Former    Types: Cigarettes    Quit date: 2001    Years since quitting: 22.0   Smokeless tobacco: Never  Vaping Use   Vaping Use: Never used  Substance and Sexual Activity   Alcohol use: Yes    Comment: Drinks occasionally   Drug use: No   Sexual activity: Yes    Birth control/protection: Surgical  Other Topics Concern   Not on file  Social History Narrative    Separated.  1 child   Not working.    Social Determinants of Health   Financial Resource Strain: Not on file  Food Insecurity: No Food Insecurity   Worried About Charity fundraiser in the Last Year: Never true   Ran Out of Food in the Last Year: Never true  Transportation Needs: No Transportation Needs   Lack of Transportation (Medical): No   Lack of Transportation (Non-Medical): No  Physical Activity: Not on file  Stress: Not on file  Social Connections: Not on file  Intimate Partner Violence: Not on file    ROS: negative/noncontributory except as in HPI  Objective:   Today's Vitals: BP 130/84    Pulse 81    Temp 98.4 F (36.9 C) (Temporal)    Ht 5\' 7"  (1.702 m)    Wt 162 lb 8 oz (73.7 kg)    SpO2 97%    BMI 25.45 kg/m   Gen: WDWN NAD AAF HEENT: NCAT, conjunctiva not injected, sclera nonicteric TM WNL B, OP moist, no exudates  NECK:  supple, no thyromegaly, no nodes, no carotid bruits CARDIAC: RRR, S1S2+, no murmur. DP 2+B LUNGS: CTAB. No wheezes ABDOMEN:  BS+, soft, NTND, No HSM, no masses EXT:  no edema MSK: R hip pain w/rotation.  Back: no TTP.  SLR sl + on R.  MS 5/5 all 4, but RLE slightly weaker than L NEURO: A&O x3.  CN II-XII intact.  PSYCH: normal mood. Good eye contact   Assessment & Plan:   Problem List Items Addressed This Visit       Cardiovascular and Mediastinum   Hypertension   Relevant Orders   CBC   Comprehensive metabolic panel   Lipid panel     Musculoskeletal and Integument   DDD (degenerative disc disease), lumbar   Relevant Medications   tiZANidine (ZANAFLEX) 4 MG tablet   ibuprofen (ADVIL) 600 MG tablet     Other   Bipolar disorder (HCC)   Other Visit Diagnoses     Need for immunization against influenza    -  Primary   Relevant Orders   Flu Vaccine QUAD 74mo+IM (Fluarix, Fluzone & Alfiuria Quad PF) (Completed)   Long term current use of antipsychotic medication       Relevant Orders   CBC   Comprehensive metabolic panel    Hemoglobin A1c   TSH   Lipid panel   Hip pain, chronic, right       Relevant Medications   tiZANidine (ZANAFLEX) 4 MG tablet   ibuprofen (ADVIL) 600 MG tablet   escitalopram (LEXAPRO) 20 MG tablet   DULoxetine (CYMBALTA) 60 MG capsule     PT/back  HTN-well controlled. Cont meds. Check labs DDD-on meds.  Still pain.  Saw ortho in 2020.  Couldn't finish tx d/t lack of insurance.  Will refer back R hip pain-never did x-ray from other PCP-will defer to ortho. Bipolar disorder-seeing MH.  Meds just adjusted.  Defer tx to them Long term meds-check labs-A1C,TSH,CMP F/u 6 mo.  Sooner if needed.  Will refiew chart.   Outpatient Encounter Medications as of 06/04/2021  Medication Sig   amLODipine (NORVASC) 5 MG tablet TAKE 1 TABLET (5 MG TOTAL) BY MOUTH DAILY. TO LOWER BLOOD PRESSURE   Ascorbic Acid (VITAMIN C) 1000 MG tablet Take 1,000 mg by mouth daily.   busPIRone (BUSPAR) 15 MG tablet TAKE 1 TABLET (15 MG TOTAL) BY MOUTH 3 (THREE) TIMES DAILY.   celecoxib (CELEBREX) 200 MG capsule TAKE 1 CAPSULE (200 MG TOTAL) BY MOUTH DAILY. STOP IBUPROFEN   diclofenac sodium (VOLTAREN) 1 % GEL Apply 4 g topically 4 (four) times daily. To help with knee pain   DULoxetine (CYMBALTA) 60 MG capsule Take 1 capsule by mouth daily.   DULoxetine HCl 40 MG CPEP Take 40 mg by mouth 2 (two) times daily at 10 AM and 5 PM.   escitalopram (LEXAPRO) 20 MG tablet TAKE 1 TABLET BY MOUTH ONCE DAILY AFTER BREAKFAST   gabapentin (NEURONTIN) 300 MG capsule Take 1 capsule (300 mg total) by mouth 3 (three) times daily. To help with back pain with radiation   hydrOXYzine (ATARAX) 25 MG tablet Take by mouth.   ibuprofen (ADVIL) 600 MG tablet Take by mouth.   meclizine (ANTIVERT) 25 MG tablet Take 1 tablet (25 mg total) by mouth 3 (three) times daily as needed for dizziness.   Multiple Vitamins-Minerals (MULTIVITAMIN WITH MINERALS) tablet Take 1 tablet by mouth daily.   QUEtiapine (SEROQUEL) 300 MG tablet Take 1 tablet (300 mg  total) by mouth at bedtime.   tiZANidine (ZANAFLEX) 4 MG tablet Take 1 tablet by mouth every 8 (eight) hours as needed.   traZODone (DESYREL) 100 MG tablet TAKE 1 TABLET (100 MG TOTAL)  BY MOUTH AT BEDTIME AS NEEDED FOR SLEEP.   [DISCONTINUED] hydrOXYzine (ATARAX) 10 MG tablet TAKE 1 TABLET (10 MG TOTAL) BY MOUTH 3 (THREE) TIMES DAILY AS NEEDED FOR ANXIETY.   [DISCONTINUED] QUEtiapine (SEROQUEL) 50 MG tablet TAKE 1 TABLET (50 MG TOTAL) BY MOUTH AT BEDTIME.   No facility-administered encounter medications on file as of 06/04/2021.    Follow-up: Return in about 6 months (around 12/02/2021) for htn/pain.   Wellington Hampshire, MD

## 2021-06-06 ENCOUNTER — Other Ambulatory Visit (INDEPENDENT_AMBULATORY_CARE_PROVIDER_SITE_OTHER): Payer: Self-pay

## 2021-06-06 DIAGNOSIS — Z79899 Other long term (current) drug therapy: Secondary | ICD-10-CM

## 2021-06-06 LAB — HEMOGLOBIN A1C: Hgb A1c MFr Bld: 5.2 % (ref 4.6–6.5)

## 2021-06-08 ENCOUNTER — Other Ambulatory Visit: Payer: Self-pay

## 2021-06-26 ENCOUNTER — Encounter: Payer: Self-pay | Admitting: Orthopaedic Surgery

## 2021-06-26 ENCOUNTER — Ambulatory Visit (INDEPENDENT_AMBULATORY_CARE_PROVIDER_SITE_OTHER): Payer: Self-pay

## 2021-06-26 ENCOUNTER — Other Ambulatory Visit: Payer: Self-pay

## 2021-06-26 ENCOUNTER — Ambulatory Visit (INDEPENDENT_AMBULATORY_CARE_PROVIDER_SITE_OTHER): Payer: Self-pay | Admitting: Orthopaedic Surgery

## 2021-06-26 VITALS — BP 137/83 | HR 94 | Ht 69.0 in | Wt 166.4 lb

## 2021-06-26 DIAGNOSIS — M545 Low back pain, unspecified: Secondary | ICD-10-CM

## 2021-06-26 DIAGNOSIS — M7061 Trochanteric bursitis, right hip: Secondary | ICD-10-CM | POA: Insufficient documentation

## 2021-06-26 DIAGNOSIS — M1611 Unilateral primary osteoarthritis, right hip: Secondary | ICD-10-CM | POA: Insufficient documentation

## 2021-06-26 DIAGNOSIS — M25551 Pain in right hip: Secondary | ICD-10-CM

## 2021-06-26 DIAGNOSIS — M5136 Other intervertebral disc degeneration, lumbar region: Secondary | ICD-10-CM

## 2021-06-26 MED ORDER — BUPIVACAINE HCL 0.25 % IJ SOLN
2.0000 mL | INTRAMUSCULAR | Status: AC | PRN
  Administered 2021-06-26: 2 mL via INTRA_ARTICULAR

## 2021-06-26 MED ORDER — LIDOCAINE HCL 1 % IJ SOLN
1.0000 mL | INTRAMUSCULAR | Status: AC | PRN
  Administered 2021-06-26: 1 mL

## 2021-06-26 MED ORDER — METHYLPREDNISOLONE ACETATE 40 MG/ML IJ SUSP
40.0000 mg | INTRAMUSCULAR | Status: AC | PRN
  Administered 2021-06-26: 40 mg via INTRA_ARTICULAR

## 2021-06-26 NOTE — Progress Notes (Signed)
Office Visit Note   Patient: Victoria Snyder           Date of Birth: 06/08/1965           MRN: [id number] Visit Date: 06/26/2021              Requested by: Tawnya Crook, MD [place],  [place] [id number] PCP: Tawnya Crook, MD   Assessment & Plan: Visit Diagnoses:  1. Low back pain, unspecified back pain laterality, unspecified chronicity, unspecified whether sciatica present   2. Pain in right hip   3. Trochanteric bursitis, right hip   4. Unilateral primary osteoarthritis, right hip   5. DDD (degenerative disc disease), lumbar     Plan: We will start with a trochanteric injection recheck her in 4 weeks.  Portion of her problem is right greater than left hip osteoarthritis.  Reviewed her MRI scan images from 10/26/2018 I gave her copy of the report.  She does have some mild stenosis at L3-4 and also L4-5.  Lateral recess narrowing but is worse in the left than the more symptomatic right side.  Recheck 4 weeks.  Follow-Up Instructions: Return in about 4 weeks (around 07/24/2021).   Orders:  Orders Placed This Encounter  Procedures   Large Joint Inj   XR HIP UNILAT W OR W/O PELVIS 2-3 VIEWS RIGHT   XR Lumbar Spine 2-3 Views   No orders of the defined types were placed in this encounter.     Procedures: Large Joint Inj: R greater trochanter on 06/26/2021 2:11 PM Details: 22 G 1.5 in needle, lateral approach Medications: 1 mL lidocaine 1 %; 2 mL bupivacaine 0.25 %; 40 mg methylPREDNISolone acetate 40 MG/ML     Clinical Data: No additional findings.   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  She she has had pain on the right side that goes down to her ankle in the sitting position she extends her knee and  reaches down to point show the area and touches where she has pain that radiates to her ankle.  She has noticed pain with walking with pain in her groin worse on the right than the left.  Pain laterally over her hip joint as well.  She states is gradually progressed.  Additionally patient has a history of bipolar disorder hypertension.  She denies associated bowel or bladder symptoms.  Review of Systems 14 point system update otherwise noncontributory to HPI.  No fever or chills.   Objective: Vital Signs: BP 137/83 (BP Location: Right Arm, Patient Position: Sitting, Cuff Size: Large)    Pulse 94    Ht 5\' 9"  (1.753 m)    Wt 166 lb 6.4 oz (75.5 kg)    SpO2 98%    BMI 24.57 kg/m   Physical Exam Constitutional:      Appearance: She is well-developed.  HENT:     Head: Normocephalic.     Right Ear: External ear normal.     Left Ear: External ear normal. There is no impacted cerumen.  Eyes:     Pupils: Pupils are equal, round, and reactive to light.  Neck:     Thyroid: No thyromegaly.     Trachea: No tracheal  deviation.  Cardiovascular:     Rate and Rhythm: Normal rate.  Pulmonary:     Effort: Pulmonary effort is normal.  Abdominal:     Palpations: Abdomen is soft.  Musculoskeletal:     Cervical back: No rigidity.  Skin:    General: Skin is warm and dry.  Neurological:     Mental Status: She is alert and oriented to person, place, and time.  Psychiatric:        Behavior: Behavior normal.    Ortho Exam patient ambulates with internal rotation lower extremity type gait.  Slight increased femoral anteversion symmetrical.  Negative Trendelenburg gait.  She has sciatic notch tenderness more on the right than left some discomfort straight leg raising 90 degrees pain with internal rotation right hip at 30 degrees.  She can figure-of-four the left has groin pain with attempted figure-of-four right.  She can get her ankle on top of her quad tendon but has increased pain with external rotation.   Negative FABER test.  Skin of the lumbar spine is normal no sacroiliac positive test negative Corky Sox.  Patient is giving way weakness anterior tib on the right which resolves with loud verbal encouragement.  Similar problem on the left side not as severe but with gastroc intermittent counter pulling.  EHL EDC is strong.  She can heel and toe walk.  Specialty Comments:  No specialty comments available.  Imaging: No results found.   PMFS History: Patient Active Problem List   Diagnosis Date Noted   Trochanteric bursitis, right hip 06/26/2021   Unilateral primary osteoarthritis, right hip 06/26/2021   DDD (degenerative disc disease), lumbar 12/17/2018   Spinal stenosis of lumbar region 12/17/2018   Hypertension 03/31/2018   Low back pain with radiation 03/31/2018   History of vertigo 03/31/2018   Anxiety and depression 03/31/2018   Bipolar disorder ([place]) 03/31/2018   Past Medical History:  Diagnosis Date   Bipolar disorder ([place])    Blood transfusion without reported diagnosis    Hypertension    Hypokalemia    Low back pain with radiation    Vertigo     Family History  Problem Relation Age of Onset   Heart disease Mother        heart attack   Stroke Father    Breast cancer Neg Hx     Past Surgical History:  Procedure Laterality Date   ABDOMINAL HYSTERECTOMY     partial for fibroids   Social History   Occupational History   Occupation: Unemployed  Tobacco Use   Smoking status: Former    Types: Cigarettes    Quit date: 2001    Years since quitting: 22.0   Smokeless tobacco: Never  Vaping Use   Vaping Use: Never used  Substance and Sexual Activity   Alcohol use: Yes    Comment: Drinks occasionally   Drug use: No   Sexual activity: Yes    Birth control/protection: Surgical

## 2021-07-13 ENCOUNTER — Other Ambulatory Visit: Payer: Self-pay

## 2021-07-13 ENCOUNTER — Encounter: Payer: Self-pay | Admitting: Family

## 2021-07-13 ENCOUNTER — Ambulatory Visit (INDEPENDENT_AMBULATORY_CARE_PROVIDER_SITE_OTHER): Payer: Medicaid Other | Admitting: Family

## 2021-07-13 VITALS — BP 127/89 | HR 86 | Temp 98.0°F | Ht 69.0 in | Wt 165.0 lb

## 2021-07-13 DIAGNOSIS — L729 Follicular cyst of the skin and subcutaneous tissue, unspecified: Secondary | ICD-10-CM

## 2021-07-13 DIAGNOSIS — R519 Headache, unspecified: Secondary | ICD-10-CM

## 2021-07-13 DIAGNOSIS — M542 Cervicalgia: Secondary | ICD-10-CM

## 2021-07-13 NOTE — Patient Instructions (Addendum)
It was very nice to see you today!  OK to continue to take 1 Aleve twice a day for the next 3-4 days only for your headache.  Be sure to take the Hydroxyzine to help you relax as your anxiety may be triggering the headache. If 1 pill  makes you too drowsy, then cut in half.  Lack of sleep can also trigger headaches, so take the Trazodone nightly for the next 5-7 nights.  Hydrate well with water all day and do not skip meals, as this can trigger headaches.  I also sent a referral to Dermatology for the cyst on your chin, they will call you directly to schedule an appointment.     PLEASE NOTE:  If you had any lab tests please let us know if you have not heard back within a few days. You may see your results on MyChart before we have a chance to review them but we will give you a call once they are reviewed by [place]. If we ordered any referrals today, please let us know if you have not heard from their office within the next week.   Please try these tips to maintain a healthy lifestyle:  Eat most of your calories during the day when you are active. Eliminate processed foods including packaged sweets (pies, cakes, cookies), reduce intake of potatoes, white bread, white pasta, and white rice. Look for whole grain options, oat flour or almond flour.  Each meal should contain half fruits/vegetables, one quarter protein, and one quarter carbs (no bigger than a computer mouse).  Cut down on sweet beverages. This includes juice, soda, and sweet tea. Also watch fruit intake, though this is a healthier sweet option, it still contains natural sugar! Limit to 3 servings daily.  Drink at least 1 glass of water with each meal and aim for at least 8 glasses per day  Exercise at least 150 minutes every week.

## 2021-07-13 NOTE — Progress Notes (Signed)
Subjective:     Patient ID: Victoria Snyder, female    DOB: [birth year], 56 y.o.   MRN: [id number]  Chief Complaint  Patient presents with   Headache    Started 2 days ago. She states it might be due to stress. She states taking Aleve, which calmed it down some but pain still has not subsided.    Cyst    On chin; She denies any drainage or pain.   Neck Pain    Pressure    HPI: Pain She reports new onset neck pain. There was not an injury that may have caused the pain. The pain started a few days ago and is staying constant. The pain does radiate to her head. The pain is described as aching, soreness, and stiffness, is moderate in intensity, occurring constantly. Symptoms are worse in the: all day.Aggravating factors: nothing specific.  She has tried NSAIDs with mild relief.  Headache: pt reports ongoing HA for 2 days, took an Aleve which helped some, but then headache returned. Reports having more stress in her life right now. Reports she is eating and drinking ok, but not sleeping well. Denies any nausea, vision problems, or dizziness.  Cyst:  reports having on her chin for years, started out smaller, but has grown and is noticeable and more tender to touch now. Denies any drainage or any other cysts/boils on her body.  Past Medical History:  Diagnosis Date   Bipolar disorder ([place])    Blood transfusion without reported diagnosis    Hypertension    Hypokalemia    Low back pain with radiation    Vertigo     Past Surgical History:  Procedure Laterality Date   ABDOMINAL HYSTERECTOMY     partial for fibroids    Outpatient Medications Prior to Visit  Medication Sig Dispense Refill   amLODipine (NORVASC) 5 MG tablet TAKE 1 TABLET (5 MG TOTAL) BY MOUTH DAILY. TO LOWER BLOOD PRESSURE 30 tablet 6   Ascorbic Acid (VITAMIN C) 1000 MG tablet Take 1,000 mg by mouth daily.     busPIRone (BUSPAR) 15 MG tablet TAKE 1 TABLET (15 MG TOTAL) BY MOUTH 3 (THREE) TIMES DAILY. 90 tablet 3    celecoxib (CELEBREX) 200 MG capsule TAKE 1 CAPSULE (200 MG TOTAL) BY MOUTH DAILY. STOP IBUPROFEN 30 capsule 6   diclofenac sodium (VOLTAREN) 1 % GEL Apply 4 g topically 4 (four) times daily. To help with knee pain 1 Tube 5   DULoxetine (CYMBALTA) 60 MG capsule Take 1 capsule by mouth daily.     DULoxetine HCl 40 MG CPEP Take 40 mg by mouth 2 (two) times daily at 10 AM and 5 PM. 60 capsule 3   escitalopram (LEXAPRO) 20 MG tablet TAKE 1 TABLET BY MOUTH ONCE DAILY AFTER BREAKFAST     gabapentin (NEURONTIN) 300 MG capsule Take 1 capsule (300 mg total) by mouth 3 (three) times daily. To help with back pain with radiation 90 capsule 3   hydrOXYzine (ATARAX) 25 MG tablet Take by mouth.     meclizine (ANTIVERT) 25 MG tablet Take 1 tablet (25 mg total) by mouth 3 (three) times daily as needed for dizziness. 30 tablet 3   Multiple Vitamins-Minerals (MULTIVITAMIN WITH MINERALS) tablet Take 1 tablet by mouth daily.     QUEtiapine (SEROQUEL) 300 MG tablet Take 1 tablet (300 mg total) by mouth at bedtime. 30 tablet 3   tiZANidine (ZANAFLEX) 4 MG tablet Take 1 tablet by mouth every 8 (eight) hours  as needed.     traZODone (DESYREL) 100 MG tablet TAKE 1 TABLET (100 MG TOTAL) BY MOUTH AT BEDTIME AS NEEDED FOR SLEEP. 30 tablet 3   ibuprofen (ADVIL) 600 MG tablet Take by mouth.     No facility-administered medications prior to visit.    No Known Allergies     Objective:    Physical Exam Vitals and nursing note reviewed.  Constitutional:      Appearance: Normal appearance.  Cardiovascular:     Rate and Rhythm: Normal rate and regular rhythm.  Pulmonary:     Effort: Pulmonary effort is normal.     Breath sounds: Normal breath sounds.  Musculoskeletal:        General: Normal range of motion.  Skin:    General: Skin is warm and dry.     Findings: Lesion (soft cyst on right side of chin, no erythema, slightly tender) present.  Neurological:     Mental Status: She is alert.  Psychiatric:        Mood  and Affect: Mood normal.        Behavior: Behavior normal.    BP 127/89    Pulse 86    Temp 98 F (36.7 C) (Temporal)    Ht 5\' 9"  (1.753 m)    Wt 165 lb (74.8 kg)    SpO2 99%    BMI 24.37 kg/m  Wt Readings from Last 3 Encounters:  07/13/21 165 lb (74.8 kg)  06/26/21 166 lb 6.4 oz (75.5 kg)  06/04/21 162 lb 8 oz (73.7 kg)       Assessment & Plan:   Problem List Items Addressed This Visit       Other   Cervicalgia    started a few days, describes as soreness, tightness on sides and back of neck, unsure if her headache is causing neck pain or vice versa. Taking Aleve which helps some, advised ok to continue for next 5 days only (also on Celebrex) and try to use heat and/or analgesic creams for up to 30 minutes tid. Also be sure using a firm pillow, can look for cervical pillow for proper neck alignment while sleeping.      Other Visit Diagnoses     Persistent headaches    -  Primary   reports having for last 2 days, admits to increased stress, reports PCP adding or increasing med doses but states she has not taken them yet. She has taken Aleve which has helped. Advised ok to continue just 1 Aleve bid (do not take any Ibuprofen) for the next 5 days only, then stop. Advised on proper hydration, eating, taking Hydroxyzine and Buspar as directed to help her anxiety and taking her sleep medicine as lack of sleep could also be a HA trigger.  Cyst of subcutaneous tissue       reports having for years, but never had evaluated, would like to see if it can be removed/drained as it has grown in size. approx. 2cm in diameter. referring to [facility]. Relevant Orders   Ambulatory referral to Dermatology

## 2021-07-13 NOTE — Assessment & Plan Note (Signed)
started a few days, describes as soreness, tightness on sides and back of neck, unsure if her headache is causing neck pain or vice versa. Taking Aleve which helps some, advised ok to continue for next 5 days only (also on Celebrex) and try to use heat and/or analgesic creams for up to 30 minutes tid. Also be sure using a firm pillow, can look for cervical pillow for proper neck alignment while sleeping.

## 2021-07-17 ENCOUNTER — Ambulatory Visit: Payer: Managed Care, Other (non HMO) | Attending: Nurse Practitioner | Admitting: Nurse Practitioner

## 2021-07-17 ENCOUNTER — Other Ambulatory Visit: Payer: Self-pay

## 2021-07-17 ENCOUNTER — Telehealth: Payer: Self-pay | Admitting: Nurse Practitioner

## 2021-07-17 NOTE — Progress Notes (Signed)
Patient no longer needs appt. Taking aleve for headaches. It appears she has another PCP Dr Cherlynn Kaiser.

## 2021-07-17 NOTE — Telephone Encounter (Signed)
No answer, LVM.

## 2021-07-20 ENCOUNTER — Other Ambulatory Visit: Payer: Self-pay

## 2021-07-20 ENCOUNTER — Telehealth: Payer: Self-pay | Admitting: Nurse Practitioner

## 2021-07-20 ENCOUNTER — Encounter (HOSPITAL_BASED_OUTPATIENT_CLINIC_OR_DEPARTMENT_OTHER): Payer: Managed Care, Other (non HMO) | Admitting: Physical Medicine & Rehabilitation

## 2021-07-20 VITALS — BP 121/86 | HR 84 | Ht 69.0 in | Wt 165.0 lb

## 2021-07-20 DIAGNOSIS — M545 Low back pain, unspecified: Secondary | ICD-10-CM

## 2021-07-20 NOTE — Progress Notes (Deleted)
Subjective:    Patient ID: Victoria Snyder, female    DOB: January 26, 1966, 56 y.o.   MRN: [id number]  HPI  Pain Inventory Average Pain 10 Pain Right Now 9 My pain is sharp, burning, and aching  In the last 24 hours, has pain interfered with the following? General activity 9 Relation with others 9 Enjoyment of life 9 What TIME of day is your pain at its worst? night Sleep (in general) Poor  Pain is worse with: walking, sitting, and some activites Pain improves with: rest Relief from Meds: 4  ability to climb steps?  yes do you drive?  yes  not employed: date last employed 09/2017 I need assistance with the following:  household duties  depression anxiety  x-rays  Orthopedist Dr. Lorin Mercy    Family History  Problem Relation Age of Onset   Heart disease Mother        heart attack   Stroke Father    Breast cancer Neg Hx    Social History   Socioeconomic History   Marital status: Married    Spouse name: Not on file   Number of children: 1   Years of education: Not on file   Highest education level: 12th grade  Occupational History   Occupation: Unemployed  Tobacco Use   Smoking status: Former    Types: Cigarettes    Quit date: 2001    Years since quitting: 22.1   Smokeless tobacco: Never  Vaping Use   Vaping Use: Never used  Substance and Sexual Activity   Alcohol use: Yes    Comment: Drinks occasionally   Drug use: No   Sexual activity: Yes    Birth control/protection: Surgical  Other Topics Concern   Not on file  Social History Narrative   Separated.  1 child   Not working.    Social Determinants of Health   Financial Resource Strain: Not on file  Food Insecurity: No Food Insecurity   Worried About Charity fundraiser in the Last Year: Never true   Ran Out of Food in the Last Year: Never true  Transportation Needs: No Transportation Needs   Lack of Transportation (Medical): No   Lack of Transportation (Non-Medical): No   Physical Activity: Not on file  Stress: Not on file  Social Connections: Not on file   Past Surgical History:  Procedure Laterality Date   ABDOMINAL HYSTERECTOMY     partial for fibroids   Past Medical History:  Diagnosis Date   Bipolar disorder ([place])    Blood transfusion without reported diagnosis    Hypertension    Hypokalemia    Low back pain with radiation    Vertigo    BP 121/86    Pulse 84    Ht 5\' 9"  (1.753 m)    Wt 165 lb (74.8 kg)    SpO2 97%    BMI 24.37 kg/m   Opioid Risk Score:   Fall Risk Score:  `1  Depression screen PHQ 2/9  Depression screen [facility] 2/9 07/20/2021 05/17/2021 10/13/2020  Decreased Interest 2 3 3   Down, Depressed, Hopeless 3 3 1   PHQ - 2 Score 5 6 4   Altered sleeping 2 3 1   Tired, decreased energy 2 0 1  Change in appetite 1 3 1   Feeling bad or failure about yourself  3 3 3   Trouble concentrating 2 3 2   Moving slowly or fidgety/restless 2 3 -  Suicidal thoughts 2 1 0  PHQ-9 Score 19 22  12  Difficult doing work/chores Very difficult Somewhat difficult -  Some encounter information is confidential and restricted. Go to Review Flowsheets activity to see all data.  Some recent data might be hidden     Review of Systems  Musculoskeletal:  Positive for back pain.       Rt hip pain  All other systems reviewed and are negative.     Objective:   Physical Exam        Assessment & Plan:

## 2021-07-20 NOTE — Telephone Encounter (Signed)
Gorda from [facility] about referral says, she is closing out referral because pt is already seeing another dr for this.

## 2021-07-20 NOTE — Progress Notes (Signed)
During patient intake on her PHQ 9 screening she marked more than half days she has thoughts of harming herself. The patient was asked if she has any plans in place to harm herself and she states no. Dr. Letta Pate is aware and states he will send patients PCP a message to address this matter with patient.

## 2021-07-20 NOTE — Telephone Encounter (Signed)
Not a current patient with [place].

## 2021-07-24 ENCOUNTER — Ambulatory Visit: Payer: Managed Care, Other (non HMO) | Admitting: Orthopaedic Surgery

## 2021-07-24 ENCOUNTER — Other Ambulatory Visit: Payer: Self-pay

## 2021-07-24 ENCOUNTER — Encounter: Payer: Self-pay | Admitting: Orthopaedic Surgery

## 2021-07-24 VITALS — BP 123/77 | HR 86 | Ht 69.0 in | Wt 164.0 lb

## 2021-07-24 DIAGNOSIS — M1611 Unilateral primary osteoarthritis, right hip: Secondary | ICD-10-CM

## 2021-07-24 NOTE — Progress Notes (Signed)
Office Visit Note   Patient: Victoria Snyder           Date of Birth: 05-Nov-1965           MRN: [id number] Visit Date: 07/24/2021              Requested by: Tawnya Crook, MD [place],  [place] [id number] PCP: Pcp, No   Assessment & Plan: Visit Diagnoses:  1. Unilateral primary osteoarthritis, right hip     Plan: We discussed options with the intra-articular injection of the hip versus total hip arthroplasty for progressive symptoms.  She can think about this and return in 3 weeks and let us know which way she would like to go for treatment.  Follow-Up Instructions: Return in about 3 weeks (around 08/14/2021).   Orders:  No orders of the defined types were placed in this encounter.  No orders of the defined types were placed in this encounter.     Procedures: No procedures performed   Clinical Data: No additional findings.   Subjective: Chief Complaint  Patient presents with   Right Leg - Pain, Follow-up   Left Leg - Pain    HPI patient turns for follow-up of right hip osteoarthritis moderate degree.  Trochanteric injection gave her relief for couple weeks she has had recurrent symptoms.  Pain is worse when the weather changes increase when she is walking more she notes she ambulates with a Trendelenburg gait.  Problems putting her socks and shoes on and she has noticed stiffness in her right hip.  She has minimal problems of the left hip.  No numbness or tingling in her feet.  Patient describes pain in mid to distal thigh that occur with walking on the right.  Review of Systems depression, bipolar disorder hip arthritis and hypertension.  All other systems are noncontributory HPI.   Objective: Vital Signs: BP 123/77    Pulse 86    Ht 5\' 9"  (1.753 m)    Wt 164 lb (74.4 kg)    BMI 24.22 kg/m   Physical Exam Constitutional:      Appearance: She is well-developed.  HENT:     Head: Normocephalic.     Right Ear: External ear normal.     Left  Ear: External ear normal. There is no impacted cerumen.  Eyes:     Pupils: Pupils are equal, round, and reactive to light.  Neck:     Thyroid: No thyromegaly.     Trachea: No tracheal deviation.  Cardiovascular:     Rate and Rhythm: Normal rate.  Pulmonary:     Effort: Pulmonary effort is normal.  Abdominal:     Palpations: Abdomen is soft.  Musculoskeletal:     Cervical back: No rigidity.  Skin:    General: Skin is warm and dry.  Neurological:     Mental Status: She is alert and oriented to person, place, and time.  Psychiatric:        Behavior: Behavior normal.    Ortho Exam reproduction of her pain with internal/external rotation of the right hip.  She internally rotates she  30 degrees.  Knee and ankle jerk are intact.  No hip flexion contracture.  Positive Trendelenburg gait on the right.  Minimal trochanteric bursal tenderness right and left.  Specialty Comments:  No specialty comments available.  Imaging: No results found.   PMFS History: Patient Active Problem List   Diagnosis Date Noted   Cervicalgia 07/13/2021   Trochanteric  bursitis, right hip 06/26/2021   Unilateral primary osteoarthritis, right hip 06/26/2021   DDD (degenerative disc disease), lumbar 12/17/2018   Spinal stenosis of lumbar region 12/17/2018   Hypertension 03/31/2018   Low back pain with radiation 03/31/2018   History of vertigo 03/31/2018   Anxiety and depression 03/31/2018   Bipolar disorder ([place]) 03/31/2018   Past Medical History:  Diagnosis Date   Bipolar disorder ([place])    Blood transfusion without reported diagnosis    Hypertension    Hypokalemia    Low back pain with radiation    Vertigo     Family History  Problem Relation Age of Onset   Heart disease Mother        heart attack   Stroke Father    Breast cancer Neg Hx     Past Surgical History:  Procedure Laterality Date   ABDOMINAL HYSTERECTOMY     partial for fibroids   Social History   Occupational History    Occupation: Unemployed  Tobacco Use   Smoking status: Former    Types: Cigarettes    Quit date: 2001    Years since quitting: 22.1   Smokeless tobacco: Never  Vaping Use   Vaping Use: Never used  Substance and Sexual Activity   Alcohol use: Yes    Comment: Drinks occasionally   Drug use: No   Sexual activity: Yes    Birth control/protection: Surgical

## 2021-08-07 ENCOUNTER — Telehealth (HOSPITAL_COMMUNITY): Payer: 59 | Admitting: Psychiatry

## 2021-08-07 ENCOUNTER — Encounter (HOSPITAL_COMMUNITY): Payer: Self-pay

## 2021-08-10 ENCOUNTER — Telehealth (HOSPITAL_COMMUNITY): Payer: No Payment, Other | Admitting: Psychiatry

## 2021-08-10 ENCOUNTER — Other Ambulatory Visit: Payer: Self-pay

## 2021-08-14 ENCOUNTER — Other Ambulatory Visit: Payer: Self-pay

## 2021-08-14 ENCOUNTER — Encounter: Payer: Self-pay | Admitting: Orthopaedic Surgery

## 2021-08-14 ENCOUNTER — Ambulatory Visit: Payer: Managed Care, Other (non HMO) | Admitting: Orthopaedic Surgery

## 2021-08-14 VITALS — BP 136/85 | HR 97 | Ht 69.0 in | Wt 152.0 lb

## 2021-08-14 DIAGNOSIS — M1611 Unilateral primary osteoarthritis, right hip: Secondary | ICD-10-CM | POA: Diagnosis not present

## 2021-08-14 NOTE — Progress Notes (Signed)
? ?Office Visit Note ?  ?Patient: Victoria Snyder           ?Date of Birth: 1965/11/24           ?MRN: [id number] ?Visit Date: 08/14/2021 ?             ?Requested by: No referring provider defined for this encounter. ?PCP: Pcp, No ? ? ?Assessment & Plan: ?Visit Diagnoses:  ?1. Unilateral primary osteoarthritis, right hip   ? ? ?Plan: Patient states she is moving wanted to wait till August to proceed with total hip arthroplasty on the right.  We discussed overnight stay to make arrangements to have family or friends that she can stay with her several days after the surgery.  Use of a walker discussed risk surgery discussed.  She can continue with Tylenol and Aleve.  Operative procedure discussed risk discussed she understands request to proceed. ? ?Follow-Up Instructions: No follow-ups on file.  ? ?Orders:  ?No orders of the defined types were placed in this encounter. ? ?No orders of the defined types were placed in this encounter. ? ? ? ? Procedures: ?No procedures performed ? ? ?Clinical Data: ?No additional findings. ? ? ?Subjective: ?Chief Complaint  ?Patient presents with  ? Lower Back - Pain  ? Right Hip - Follow-up  ? ? ?HPI 56 year old female seen with the right groin and thigh pain has been present for several years.  She has not worked in 43 or 6 years.  She has had groin pain and previous x-rays showed cystic degenerative changes in the hip joint right greater than left.  She also has had some problems with chronic back pain with previous lumbar MRI June 2020 showing some annular bulging mild stenosis and lateral recess stenosis at L3-4 and left paracentral disc protrusion at L5-S1 with mild central stenosis.  She denies claudication symptoms.  At times she has pain and stinging in the lateral calf right and left.  Principal problem is been right groin pain and right anterior thigh pain that occurs with weightbearing.  She has problems putting on her shoes and socks at times. ? ?Review of Systems past  history of treatment of bipolar disorder bilateral hip osteoarthritis anxiety and chronic low back pain with spinal stenosis.  All other systems are noncontributory.  Negative for rheumatologic conditions negative for gout. ? ? ?Objective: ?Vital Signs: BP 136/85   Pulse 97   Ht '5\' 9"'$  (1.753 m)   Wt 152 lb (68.9 kg)   BMI 22.45 kg/m?  ? ?Physical Exam ?Constitutional:   ?   Appearance: She is well-developed.  ?HENT:  ?   Head: Normocephalic.  ?   Right Ear: External ear normal.  ?   Left Ear: External ear normal. There is no impacted cerumen.  ?Eyes:  ?   Pupils: Pupils are equal, round, and reactive to light.  ?Neck:  ?   Thyroid: No thyromegaly.  ?   Trachea: No tracheal deviation.  ?Cardiovascular:  ?   Rate and Rhythm: Normal rate.  ?Pulmonary:  ?   Effort: Pulmonary effort is normal.  ?Abdominal:  ?   Palpations: Abdomen is soft.  ?Musculoskeletal:  ?   Cervical back: No rigidity.  ?Skin: ?   General: Skin is warm and dry.  ?Neurological:  ?   Mental Status: She is alert and oriented to person, place, and time.  ?Psychiatric:     ?   Behavior: Behavior normal.  ? ? ?Ortho Exam patient is Dealer  with positive right Trendelenburg gait she has some trochanteric bursal tenderness minimal sciatic notch tenderness negative straight leg raising 90 degrees negative popliteal compression test knee and ankle jerk are 2+ and symmetrical anterior tib EHL distal pulses are normal.  No abductor weakness.  Right hip has reproduction of pain with 30 degrees internal rotation.  Left hip 40 degrees internal/external rotation without pain. ? ?Specialty Comments:  ?No specialty comments available. ? ?Imaging: ?Final [[id number]]  ? ?PACS Intelerad Image Link hip x-rays 06/26/2021 ? ? Show images for XR HIP UNILAT W OR W/O PELVIS 2-3 VIEWS RIGHT ?Result Narrative ? ?AP pelvis frog-leg right hip x-ray obtained and reviewed.  This shows hip  ?osteoarthritis progressive from previous hip x-rays 2 years ago.  There is  ?subchondral  sclerosis marginal osteophytes joint space narrowing worse on  ?the right hip and left hip.  Also noted is lumbar facet degenerative  ?changes.  ? ?Impression: Moderate right greater than left hip osteoarthritis. ? ? ?PMFS History: ?Patient Active Problem List  ? Diagnosis Date Noted  ? Cervicalgia 07/13/2021  ? Trochanteric bursitis, right hip 06/26/2021  ? Unilateral primary osteoarthritis, right hip 06/26/2021  ? DDD (degenerative disc disease), lumbar 12/17/2018  ? Spinal stenosis of lumbar region 12/17/2018  ? Hypertension 03/31/2018  ? Low back pain with radiation 03/31/2018  ? History of vertigo 03/31/2018  ? Anxiety and depression 03/31/2018  ? Bipolar disorder ([place]) 03/31/2018  ? ?Past Medical History:  ?Diagnosis Date  ? Bipolar disorder ([place])   ? Blood transfusion without reported diagnosis   ? Hypertension   ? Hypokalemia   ? Low back pain with radiation   ? Vertigo   ?  ?Family History  ?Problem Relation Age of Onset  ? Heart disease Mother   ?     heart attack  ? Stroke Father   ? Breast cancer Neg Hx   ?  ?Past Surgical History:  ?Procedure Laterality Date  ? ABDOMINAL HYSTERECTOMY    ? partial for fibroids  ? ?Social History  ? ?Occupational History  ? Occupation: Unemployed  ?Tobacco Use  ? Smoking status: Former  ?  Types: Cigarettes  ?  Quit date: 2001  ?  Years since quitting: 22.2  ? Smokeless tobacco: Never  ?Vaping Use  ? Vaping Use: Never used  ?Substance and Sexual Activity  ? Alcohol use: Yes  ?  Comment: Drinks occasionally  ? Drug use: No  ? Sexual activity: Yes  ?  Birth control/protection: Surgical  ? ? ? ? ? ? ?

## 2021-08-23 ENCOUNTER — Telehealth (INDEPENDENT_AMBULATORY_CARE_PROVIDER_SITE_OTHER): Payer: 59 | Admitting: Psychiatry

## 2021-08-23 DIAGNOSIS — F32A Depression, unspecified: Secondary | ICD-10-CM | POA: Diagnosis not present

## 2021-08-23 DIAGNOSIS — F419 Anxiety disorder, unspecified: Secondary | ICD-10-CM | POA: Diagnosis not present

## 2021-08-23 DIAGNOSIS — F314 Bipolar disorder, current episode depressed, severe, without psychotic features: Secondary | ICD-10-CM | POA: Diagnosis not present

## 2021-08-23 MED ORDER — HYDROXYZINE HCL 10 MG PO TABS
10.0000 mg | ORAL_TABLET | Freq: Three times a day (TID) | ORAL | 2 refills | Status: DC | PRN
  Filled 2021-08-23 – 2021-09-17 (×2): qty 90, 30d supply, fill #0

## 2021-08-23 MED ORDER — DULOXETINE HCL 40 MG PO CPEP
40.0000 mg | ORAL_CAPSULE | Freq: Two times a day (BID) | ORAL | 3 refills | Status: DC
Start: 2021-08-23 — End: 2021-11-15
  Filled 2021-08-23 – 2021-09-17 (×2): qty 60, 30d supply, fill #0

## 2021-08-23 MED ORDER — QUETIAPINE FUMARATE 300 MG PO TABS
300.0000 mg | ORAL_TABLET | Freq: Every day | ORAL | 2 refills | Status: DC
  Filled 2021-08-23: qty 30, 30d supply, fill #0

## 2021-08-23 MED ORDER — BUSPIRONE HCL 15 MG PO TABS
ORAL_TABLET | ORAL | 2 refills | Status: DC
Start: 2021-08-23 — End: 2021-11-15
  Filled 2021-08-23 – 2021-09-17 (×2): qty 90, 30d supply, fill #0

## 2021-08-23 MED ORDER — TRAZODONE HCL 100 MG PO TABS
ORAL_TABLET | Freq: Every evening | ORAL | 2 refills | Status: DC | PRN
Start: 2021-08-23 — End: 2021-11-15
  Filled 2021-08-23: qty 30, fill #0

## 2021-08-23 NOTE — Progress Notes (Signed)
BH MD/PA/NP OP Progress Note ? ?08/23/2021 5:10 PM ?Victoria Snyder  ?MRN:  [id number] ? ?Virtual Visit via Video Note ? ?I connected with Victoria Snyder on 08/23/21 at  4:30 PM EDT by a video enabled telemedicine application and verified that I am speaking with the correct person using two identifiers. ? ?Location: ?Patient: home ?Provider: offsite ?  ?I discussed the limitations of evaluation and management by telemedicine and the availability of in person appointments. The patient expressed understanding and agreed to proceed. ? ?  ?I discussed the assessment and treatment plan with the patient. The patient was provided an opportunity to ask questions and all were answered. The patient agreed with the plan and demonstrated an understanding of the instructions. ?  ?The patient was advised to call back or seek an in-person evaluation if the symptoms worsen or if the condition fails to improve as anticipated. ? ?I provided 10 minutes of non-face-to-face time during this encounter. ? ? ?Franne Grip, NP  ? ?Chief Complaint: Medication management ? ?HPI: Victoria Snyder is a 56 year old female presenting to [facility] behavioral health outpatient for follow-up psychiatric evaluation.  Patient has a history of bipolar disorder, anxiety and depression.  Her symptoms are managed BuSpar 15 mg 3 times daily, Cymbalta 40 mg 2 times daily, hydroxyzine 10 mg 3 times daily as needed for anxiety, Seroquel 300 mg at bedtime, and trazodone 100 mg at bedtime as needed for sleep.  Patient reports that she does not use the trazodone as often.  Patient reports that her symptoms are managed well with her anxiety but she is experiencing life stressors which include back and hip pain and family stressors for a couple of months. Patient reported feeling angry and depressed due to her family issues. Her provider recommended hip surgery possibly in August.  Patient reports that she has been out of her hydroxyzine to manage her anxiety and  requested refills.  She denies a need for dosage adjustment today or adverse medication effects.  No medication changes today.  Medications refilled at current dosages. ?Patient is alert and oriented x4, calm, pleasant and willing to engage.  She appears well-groomed and dressed appropriately for the weather.  She denies suicidal or homicidal ideations, paranoia, delusional thought, auditory or visual hallucinations. ? ?Visit Diagnosis:  ?  ICD-10-CM   ?1. Bipolar disorder, current episode depressed, severe, without psychotic features ([place])  F31.4 traZODone (DESYREL) 100 MG tablet  ?  QUEtiapine (SEROQUEL) 300 MG tablet  ?  ?2. Anxiety and depression  F41.9 DULoxetine HCl 40 MG CPEP  ? F32.A busPIRone (BUSPAR) 15 MG tablet  ?  ? ? ?Past Psychiatric History: Bipolar disorder, anxiety and depression ? ?Past Medical History:  ?Past Medical History:  ?Diagnosis Date  ? Bipolar disorder ([place])   ? Blood transfusion without reported diagnosis   ? Hypertension   ? Hypokalemia   ? Low back pain with radiation   ? Vertigo   ?  ?Past Surgical History:  ?Procedure Laterality Date  ? ABDOMINAL HYSTERECTOMY    ? partial for fibroids  ? ? ?Family Psychiatric History: None known ? ?Family History:  ?Family History  ?Problem Relation Age of Onset  ? Heart disease Mother   ?     heart attack  ? Stroke Father   ? Breast cancer Neg Hx   ? ? ?Social History:  ?Social History  ? ?Socioeconomic History  ? Marital status: Married  ?  Spouse name: Not on file  ? Number  of children: 1  ? Years of education: Not on file  ? Highest education level: 12th grade  ?Occupational History  ? Occupation: Unemployed  ?Tobacco Use  ? Smoking status: Former  ?  Types: Cigarettes  ?  Quit date: 2001  ?  Years since quitting: 22.2  ? Smokeless tobacco: Never  ?Vaping Use  ? Vaping Use: Never used  ?Substance and Sexual Activity  ? Alcohol use: Yes  ?  Comment: Drinks occasionally  ? Drug use: No  ? Sexual activity: Yes  ?  Birth control/protection:  Surgical  ?Other Topics Concern  ? Not on file  ?Social History Narrative  ? Separated.  1 child  ? Not working.   ? ?Social Determinants of Health  ? ?Financial Resource Strain: Not on file  ?Food Insecurity: No Food Insecurity  ? Worried About Charity fundraiser in the Last Year: Never true  ? Ran Out of Food in the Last Year: Never true  ?Transportation Needs: No Transportation Needs  ? Lack of Transportation (Medical): No  ? Lack of Transportation (Non-Medical): No  ?Physical Activity: Not on file  ?Stress: Not on file  ?Social Connections: Not on file  ? ? ?Allergies: No Known Allergies ? ?Metabolic Disorder Labs: ?Lab Results  ?Component Value Date  ? HGBA1C 5.2 06/06/2021  ? ?No results found for: PROLACTIN ?Lab Results  ?Component Value Date  ? CHOL 193 06/04/2021  ? TRIG 118.0 06/04/2021  ? HDL 64.60 06/04/2021  ? CHOLHDL 3 06/04/2021  ? VLDL 23.6 06/04/2021  ? LDLCALC 105 (H) 06/04/2021  ? [place] 94 08/18/2020  ? ?Lab Results  ?Component Value Date  ? TSH 1.04 06/04/2021  ? TSH 1.840 10/22/2017  ? ? ?Therapeutic Level Labs: ?No results found for: LITHIUM ?No results found for: VALPROATE ?No components found for:  CBMZ ? ?Current Medications: ?Current Outpatient Medications  ?Medication Sig Dispense Refill  ? amLODipine (NORVASC) 5 MG tablet TAKE 1 TABLET (5 MG TOTAL) BY MOUTH DAILY. TO LOWER BLOOD PRESSURE 30 tablet 6  ? Ascorbic Acid (VITAMIN C) 1000 MG tablet Take 1,000 mg by mouth daily.    ? busPIRone (BUSPAR) 15 MG tablet TAKE 1 TABLET (15 MG TOTAL) BY MOUTH 3 (THREE) TIMES DAILY. 90 tablet 2  ? celecoxib (CELEBREX) 200 MG capsule TAKE 1 CAPSULE (200 MG TOTAL) BY MOUTH DAILY. STOP IBUPROFEN 30 capsule 6  ? diclofenac sodium (VOLTAREN) 1 % GEL Apply 4 g topically 4 (four) times daily. To help with knee pain 1 Tube 5  ? DULoxetine HCl 40 MG CPEP Take 40 mg by mouth 2 (two) times daily at 10 AM and 5 PM. 60 capsule 3  ? gabapentin (NEURONTIN) 300 MG capsule Take 1 capsule (300 mg total) by mouth 3  (three) times daily. To help with back pain with radiation 90 capsule 3  ? hydrOXYzine (ATARAX) 10 MG tablet Take 1 tablet (10 mg total) by mouth 3 (three) times daily as needed for anxiety. 90 tablet 2  ? meclizine (ANTIVERT) 25 MG tablet Take 1 tablet (25 mg total) by mouth 3 (three) times daily as needed for dizziness. 30 tablet 3  ? Multiple Vitamins-Minerals (MULTIVITAMIN WITH MINERALS) tablet Take 1 tablet by mouth daily.    ? QUEtiapine (SEROQUEL) 300 MG tablet Take 1 tablet (300 mg total) by mouth at bedtime. 30 tablet 2  ? tiZANidine (ZANAFLEX) 4 MG tablet Take 1 tablet by mouth every 8 (eight) hours as needed.    ? traZODone (  DESYREL) 100 MG tablet TAKE 1 TABLET (100 MG TOTAL) BY MOUTH AT BEDTIME AS NEEDED FOR SLEEP. 30 tablet 2  ? ?No current facility-administered medications for this visit.  ? ? ? ?Musculoskeletal: ?Strength & Muscle Tone: N/A virtual visit  ?Gait & Station: N/A ?Patient leans: N/A ? ?Psychiatric Specialty Exam: ?Review of Systems  ?Psychiatric/Behavioral:  Negative for hallucinations, self-injury and suicidal ideas.   ?All other systems reviewed and are negative.  ?There were no vitals taken for this visit.There is no height or weight on file to calculate BMI.  ?General Appearance: Well Groomed  ?Eye Contact:  Good  ?Speech:  Clear and Coherent  ?Volume:  Normal  ?Mood:  Depressed  ?Affect:  Congruent  ?Thought Process:  Goal Directed  ?Orientation:  Full (Time, Place, and Person)  ?Thought Content: Logical   ?Suicidal Thoughts:  No  ?Homicidal Thoughts:  No  ?Memory: Good  ?Judgement: Good  ?Insight: Good  ?Psychomotor Activity: N/A  ?Concentration: Good  ?Recall: Good  ?Fund of Knowledge: Good  ?Language: Good  ?Akathisia: N/A  ?Handed: Right  ?AIMS (if indicated): not done  ?Assets:  Communication Skills ?Desire for Improvement  ?ADL's:  Intact  ?Cognition: WNL  ?Sleep:  Good  ? ?Screenings: ?GAD-7   ? ?Flowsheet Row Video Visit from 06/01/2021 in [facility] Office Visit from 05/17/2021 in [facility] Video Visit from 12/15/2020 in [facility] from 10/13/2020 in [facility]

## 2021-08-24 ENCOUNTER — Other Ambulatory Visit: Payer: Self-pay

## 2021-08-31 ENCOUNTER — Other Ambulatory Visit: Payer: Self-pay

## 2021-09-12 ENCOUNTER — Telehealth (HOSPITAL_COMMUNITY): Payer: Self-pay | Admitting: Clinical

## 2021-09-12 NOTE — Telephone Encounter (Signed)
Therapist attempted to call the client to offer an available opening for a therapy session today. Client did not answer. Therapist left a voicemail for the client to call the office back. ?

## 2021-09-17 ENCOUNTER — Other Ambulatory Visit: Payer: Self-pay

## 2021-10-04 ENCOUNTER — Encounter: Payer: Self-pay | Admitting: Physician Assistant

## 2021-10-04 ENCOUNTER — Ambulatory Visit (INDEPENDENT_AMBULATORY_CARE_PROVIDER_SITE_OTHER): Payer: Self-pay | Admitting: Physician Assistant

## 2021-10-04 ENCOUNTER — Other Ambulatory Visit: Payer: Self-pay

## 2021-10-04 DIAGNOSIS — M5136 Other intervertebral disc degeneration, lumbar region: Secondary | ICD-10-CM

## 2021-10-04 DIAGNOSIS — M1611 Unilateral primary osteoarthritis, right hip: Secondary | ICD-10-CM

## 2021-10-04 DIAGNOSIS — M545 Low back pain, unspecified: Secondary | ICD-10-CM

## 2021-10-04 MED ORDER — GABAPENTIN 300 MG PO CAPS
300.0000 mg | ORAL_CAPSULE | Freq: Three times a day (TID) | ORAL | 1 refills | Status: DC
  Filled 2021-10-04: qty 90, 30d supply, fill #0

## 2021-10-04 MED ORDER — TIZANIDINE HCL 4 MG PO TABS
4.0000 mg | ORAL_TABLET | Freq: Three times a day (TID) | ORAL | 0 refills | Status: DC | PRN
Start: 2021-10-04 — End: 2021-11-09
  Filled 2021-10-04: qty 30, 10d supply, fill #0

## 2021-10-04 MED ORDER — CELECOXIB 200 MG PO CAPS
ORAL_CAPSULE | ORAL | 6 refills | Status: DC
Start: 2021-10-04 — End: 2021-11-09
  Filled 2021-10-04: qty 30, 30d supply, fill #0

## 2021-10-04 NOTE — Progress Notes (Signed)
? ?Subjective:  ? ? Patient ID: Victoria Snyder, female    DOB: [birth year], 56 y.o.   MRN: [id number] ? ?Chief Complaint  ?Patient presents with  ? Leg Pain  ?  Complains of shooting pain at night down her lower leg. She says that she will be having hip surgery in Aug, and believes this may be related.   ? Referral  ?  Therapist; she says that she was seen someone at [address] but has not been able to get in.  ? ? ?Leg Pain  ? ?Patient of orthopedist, Dr. Lorin Mercy, is in today for right lower leg pain. Aches at night, wakes her up out of her sleep. Sharp pain at night. Sometimes needs to walk to get it feeling better at night. Supposed to have surgery in August for R hip per pt.  ?Talks about a fall about 1.5 years ago that seemed to start this pain; no known injury since then. Low back pain for years. ? ?Takes gabapentin 300 mg TID, but says she hasn't been consistent with this. Ran out of Celebrex 200 mg.  Not taking tizanidine 4 mg every 8 hours prn. Says she has "a lot going on" and can't remember all of her medications right now.  ? ?Has not done PT for awhile, at least 4 years per patient.  ? ?Past Medical History:  ?Diagnosis Date  ? Bipolar disorder ([place])   ? Blood transfusion without reported diagnosis   ? Hypertension   ? Hypokalemia   ? Low back pain with radiation   ? Vertigo   ? ? ?Past Surgical History:  ?Procedure Laterality Date  ? ABDOMINAL HYSTERECTOMY    ? partial for fibroids  ? ? ?Family History  ?Problem Relation Age of Onset  ? Heart disease Mother   ?     heart attack  ? Stroke Father   ? Breast cancer Neg Hx   ? ? ?Social History  ? ?Tobacco Use  ? Smoking status: Former  ?  Types: Cigarettes  ?  Quit date: 2001  ?  Years since quitting: 22.3  ? Smokeless tobacco: Never  ?Vaping Use  ? Vaping Use: Never used  ?Substance Use Topics  ? Alcohol use: Yes  ?  Comment: Drinks occasionally  ? Drug use: No  ?  ? ?No Known Allergies ? ?Review of Systems ?NEGATIVE UNLESS OTHERWISE INDICATED  IN HPI ? ? ?   ?Objective:  ?  ? ?BP 124/80 (BP Location: Left Arm, Patient Position: Sitting, Cuff Size: Normal)   Pulse 85   Temp 98.2 ?F (36.8 ?C) (Temporal)   Ht '5\' 9"'$  (1.753 m)   Wt 165 lb 6.4 oz (75 kg)   SpO2 99%   BMI 24.43 kg/m?  ? ?Wt Readings from Last 3 Encounters:  ?10/04/21 165 lb 6.4 oz (75 kg)  ?08/14/21 152 lb (68.9 kg)  ?07/24/21 164 lb (74.4 kg)  ? ? ?BP Readings from Last 3 Encounters:  ?10/04/21 124/80  ?08/14/21 136/85  ?07/24/21 123/77  ?  ? ?Physical Exam ?Constitutional:   ?   General: She is not in acute distress. ?   Appearance: Normal appearance. She is not ill-appearing.  ?Musculoskeletal:  ?   Comments: Rubbing her right leg, keeps trying to stretch it out.  ?Slightly weaker strength in RLE compared to LLE secondary to pain. ?No swelling. ?Pulses intact. ?N/V intact.   ?Skin: ?   Findings: No rash.  ?Neurological:  ?   General:  No focal deficit present.  ?   Mental Status: She is alert.  ? ? ?   ?Assessment & Plan:  ? ?Problem List Items Addressed This Visit   ? ?  ? Musculoskeletal and Integument  ? DDD (degenerative disc disease), lumbar  ? Relevant Medications  ? celecoxib (CELEBREX) 200 MG capsule  ? gabapentin (NEURONTIN) 300 MG capsule  ? tiZANidine (ZANAFLEX) 4 MG tablet  ? Other Relevant Orders  ? Ambulatory referral to Physical Therapy  ?  ? Other  ? Low back pain with radiation  ? Relevant Medications  ? celecoxib (CELEBREX) 200 MG capsule  ? gabapentin (NEURONTIN) 300 MG capsule  ? tiZANidine (ZANAFLEX) 4 MG tablet  ? Other Relevant Orders  ? Ambulatory referral to Physical Therapy  ? ?Other Visit Diagnoses   ? ? Primary osteoarthritis of right hip      ? Relevant Medications  ? celecoxib (CELEBREX) 200 MG capsule  ? gabapentin (NEURONTIN) 300 MG capsule  ? tiZANidine (ZANAFLEX) 4 MG tablet  ? Other Relevant Orders  ? Ambulatory referral to Physical Therapy  ? Lumbar degenerative disc disease      ? Relevant Medications  ? celecoxib (CELEBREX) 200 MG capsule  ?  gabapentin (NEURONTIN) 300 MG capsule  ? tiZANidine (ZANAFLEX) 4 MG tablet  ? Other Relevant Orders  ? Ambulatory referral to Physical Therapy  ? ?  ? ? ? ?Meds ordered this encounter  ?Medications  ? celecoxib (CELEBREX) 200 MG capsule  ?  Sig: TAKE 1 CAPSULE (200 MG TOTAL) BY MOUTH DAILY. STOP IBUPROFEN  ?  Dispense:  30 capsule  ?  Refill:  6  ?  Order Specific Question:   Supervising Provider  ?  Answer:   Marin Olp [[id number]]  ? gabapentin (NEURONTIN) 300 MG capsule  ?  Sig: Take 1 capsule (300 mg total) by mouth 3 (three) times daily. To help with back pain with radiation  ?  Dispense:  90 capsule  ?  Refill:  1  ?  Order Specific Question:   Supervising Provider  ?  Answer:   Marin Olp [[id number]]  ? tiZANidine (ZANAFLEX) 4 MG tablet  ?  Sig: Take 1 tablet (4 mg total) by mouth every 8 (eight) hours as needed.  ?  Dispense:  30 tablet  ?  Refill:  0  ?  Order Specific Question:   Supervising Provider  ?  Answer:   Marin Olp [[id number]]  ? ?PLAN: ?-Chronic pain 2/2 arthritis from R hip and L spine. ?-Follows with Dr. Lorin Mercy ?-Negative for any fevers, saddle anesthesia, foot-drop, incontinence of urine or stool, hx of cancer, and no recent injury. ?-Plan for today is to reinstate her taking daily medications as she was previously prescribed including Celebrex 200 mg daily, gabapentin 300 mg 3 times daily, Zanaflex 4 mg every 8 hours as needed. ?-May try to get her reinstated with physical therapy until she is able to get in for her surgery in August for additional relief. ? ?ER precautions advised.  ? ? ?This note was prepared with assistance of Systems analyst. Occasional wrong-word or sound-a-like substitutions may have occurred due to the inherent limitations of voice recognition software. ? ? ? ?Stacie Knutzen M Yaron Grasse, PA-C ?

## 2021-10-04 NOTE — Patient Instructions (Signed)
Good to meet you today! ?I have sent refills of your medications for your back and leg pain.  Please be consistent with taking these daily. ? ?I have also sent a referral to our physical therapists at our office to try to buy you some time and relief until surgery in August.  ?

## 2021-10-08 ENCOUNTER — Other Ambulatory Visit: Payer: Self-pay

## 2021-10-23 ENCOUNTER — Encounter: Payer: Self-pay | Admitting: Physical Therapy

## 2021-10-23 ENCOUNTER — Ambulatory Visit (INDEPENDENT_AMBULATORY_CARE_PROVIDER_SITE_OTHER): Payer: Commercial Managed Care - HMO | Admitting: Physical Therapy

## 2021-10-23 DIAGNOSIS — M5459 Other low back pain: Secondary | ICD-10-CM | POA: Diagnosis not present

## 2021-10-23 DIAGNOSIS — R2689 Other abnormalities of gait and mobility: Secondary | ICD-10-CM | POA: Diagnosis not present

## 2021-10-23 DIAGNOSIS — M25651 Stiffness of right hip, not elsewhere classified: Secondary | ICD-10-CM

## 2021-10-23 DIAGNOSIS — M25551 Pain in right hip: Secondary | ICD-10-CM | POA: Diagnosis not present

## 2021-10-23 NOTE — Therapy (Signed)
OUTPATIENT PHYSICAL THERAPY EVALUATION   Patient Name: Victoria Snyder MRN: [id number] DOB:Jul 17, 1965, 56 y.o., female Today's Date: 10/23/2021   PT End of Session - 10/23/21 1202     Visit Number 1    Number of Visits 16    Date for PT Re-Evaluation 12/18/21    Authorization Type [employer]   (50 % co-insurance)    PT Start Time 0934    PT Stop Time 1012    PT Time Calculation (min) 38 min    Activity Tolerance Patient tolerated treatment well    Behavior During Therapy WFL for tasks assessed/performed             Past Medical History:  Diagnosis Date   Bipolar disorder ([place])    Blood transfusion without reported diagnosis    Hypertension    Hypokalemia    Low back pain with radiation    Vertigo    Past Surgical History:  Procedure Laterality Date   ABDOMINAL HYSTERECTOMY     partial for fibroids   Patient Active Problem List   Diagnosis Date Noted   Cervicalgia 07/13/2021   Trochanteric bursitis, right hip 06/26/2021   Unilateral primary osteoarthritis, right hip 06/26/2021   DDD (degenerative disc disease), lumbar 12/17/2018   Spinal stenosis of lumbar region 12/17/2018   Hypertension 03/31/2018   Low back pain with radiation 03/31/2018   History of vertigo 03/31/2018   Anxiety and depression 03/31/2018   Bipolar disorder ([place]) 03/31/2018    PCP: Clerance Lav  REFERRING PROVIDER: Yetta Flock Allwardt  REFERRING DIAG: R hip pain, low back pain   Rationale for Evaluation and Treatment Rehabilitation  THERAPY DIAG:  Other low back pain  Pain in right hip  Stiffness of right hip, not elsewhere classified  Other abnormalities of gait and mobility  ONSET DATE:   SUBJECTIVE:                                                                                                                                                                                           SUBJECTIVE STATEMENT: Pt states ongoing pain in back and R hip, for a few years.  States much  stiffness in R hip and back, difficulty walking, getting up and down,  She has not worked in 4 years, but active around the house  Used to enjoy working out, not exercising now. Unable to walk for exercise due to pain.  Stairs: full flight to get in. Has railing  States she is candidate for R THA in August ? Was told she would Likely will need on L as well in future.     PERTINENT  HISTORY:    PAIN:  Are you having pain? Yes: NPRS scale: 8/10 Pain location:  Low Back R>L  Pain description: radiating into R Lower leg , stiff  Aggravating factors: bending, sitting too long.  Relieving factors: none stated    Are you having pain? Yes: NPRS scale: 8/10 Pain location: R hip  Pain description: sore, stiff  Aggravating factors: Initial standing, sitting too long. Relieving factors: none stated    PRECAUTIONS: None  WEIGHT BEARING RESTRICTIONS No  FALLS:  Has patient fallen in last 6 months? No  PLOF: Independent  PATIENT GOALS  decreased pain in hip and back, move better, do more activity.    OBJECTIVE:    COGNITION:  Overall cognitive status: Within functional limits for tasks assessed      POSTURE:  Standing: increased lumbar lordosis, mild knee valgus,  Supine: increased lumbar lordosis,   PALPATION: R hip hypomobile, tightness in lumbar musculature.   LUMBAR ROM:  Flexion: mod/significant limitation- 90 deg Ext: Sign limitation, pain SB: Mild/mod limitation  Hips: L: mild limitation for flex and ER,  R: mod limitation for flex, ER, Ext and IR with pain.  Knee: WFL PROM, some limitation for AROM of R knee due to guarding for R hip pain.    Strength:  L hip: flex/abd: 4/5 ,    R hip:  4-/5  L knee: 4/5,  R knee: 4/5    GAIT: Distance walked: 167f Assistive device utilized: None Level of assistance: Complete Independence Comments: Significant gait deficits:  Lumbar Stiffness, with increased lordosis,  decreased hip extension R>L, decreased hip flexion  with advancing limb, knee valgus bil, decreased knee extension with swing, decreased heel strike, and poor step height.    TODAY'S TREATMENT  See below for HEP, reviewed ther ex    PATIENT EDUCATION:  Education details: PT POC, Exam findings, HEP, posture education,  Person educated: Patient Education method: Explanation, Demonstration, Tactile cues, Verbal cues, and Handouts Education comprehension: verbalized understanding, returned demonstration, verbal cues required, tactile cues required, and needs further education   HOME EXERCISE PROGRAM: Access Code: [email or web address] URL: [email or web address] Date: 10/23/2021 Prepared by: LLyndee Hensen Exercises - Hooklying Single Knee to Chest Stretch  - 2 x daily - 3 reps - 30 hold - Supine Lower Trunk Rotation  - 2 x daily - 10 reps - 5 hold - Supine Posterior Pelvic Tilt  - 2 x daily - 1-2 sets - 10 reps - Seated Knee Extension AROM  - 1 x daily - 1-2 sets - 10 reps   ASSESSMENT:  CLINICAL IMPRESSION: Pt presents with primary complaint of increased pain in back and R hip. Pt with much stiffness in R hip, with decreased ROM. She has tightness and pain in R low back as well, with increased extension/lordotic posture. She has poor movement quality, for transfers and gait, with hip stiffness being primary limiting factor. She has much decreased activity level in last couple years with decreased ability for activity and exercise. Pt with decreased ability for full functional activities due to pain, and will benefit from skilled PT to improve.    OBJECTIVE IMPAIRMENTS Abnormal gait, decreased activity tolerance, decreased mobility, difficulty walking, decreased ROM, decreased strength, impaired perceived functional ability, increased muscle spasms, impaired flexibility, improper body mechanics, and pain.   ACTIVITY LIMITATIONS carrying, lifting, bending, sitting, standing, squatting, stairs, transfers, and locomotion  level  PARTICIPATION LIMITATIONS: meal prep, cleaning, laundry, driving, shopping, community activity, and yard work  PFairfieldTime since onset  of injury/illness/exacerbation are also affecting patient's functional outcome.   REHAB POTENTIAL: Good  CLINICAL DECISION MAKING: Stable/uncomplicated  EVALUATION COMPLEXITY: Low   GOALS: Goals reviewed with patient? Yes  SHORT TERM GOALS: Target date: 6/13/023  Pt to be independent with initial HEP   Goal status: INITIAL  2. Pt to demo ability for optimal seated and supine posture with neutral spine and improved lordosis  Goal status: INITIAL  3.  Pt to demo ability for sit to stand with optimal mechanics.   Goal status: INITIAL   LONG TERM GOALS: Target date: 12/18/2021  Pt to be independent with final HEP  Goal status: INITIAL  2.  Pt to demo ability for improved lumbar flexion ROM to be [facility] for pt age.   Goal status: INITIAL  3.  Pt to demo improved strength of hips to at least 4+/5 to improve stability, gait, and stairs.   Goal status: INITIAL  4.  Pt to demo ambulation mechanics to be [facility] for pt age and diagnosis, to improve efficiency and ability for community activity.   Goal status: INITIAL     PLAN: PT FREQUENCY: 1-2x/week  PT DURATION: 8 weeks  PLANNED INTERVENTIONS: Therapeutic exercises, Therapeutic activity, Neuromuscular re-education, Gait training, Patient/Family education, Joint manipulation, Joint mobilization, Stair training, DME instructions, Dry Needling, Electrical stimulation, Spinal manipulation, Spinal mobilization, Cryotherapy, Moist heat, Taping, Vasopneumatic device, Traction, Ultrasound, Ionotophoresis '4mg'$ /ml Dexamethasone, and Manual therapy.  PLAN FOR NEXT SESSION:  Lumbar/hip  mobility, seated posture, sit to stands,  bike,   Lyndee Hensen, PT, DPT 8:30 PM  10/23/21

## 2021-10-26 ENCOUNTER — Other Ambulatory Visit: Payer: Self-pay

## 2021-10-31 ENCOUNTER — Ambulatory Visit (INDEPENDENT_AMBULATORY_CARE_PROVIDER_SITE_OTHER): Payer: Commercial Managed Care - HMO | Admitting: Physical Therapy

## 2021-10-31 ENCOUNTER — Encounter: Payer: Self-pay | Admitting: Physical Therapy

## 2021-10-31 DIAGNOSIS — M5459 Other low back pain: Secondary | ICD-10-CM

## 2021-10-31 DIAGNOSIS — R2689 Other abnormalities of gait and mobility: Secondary | ICD-10-CM

## 2021-10-31 DIAGNOSIS — M25551 Pain in right hip: Secondary | ICD-10-CM | POA: Diagnosis not present

## 2021-10-31 DIAGNOSIS — M25651 Stiffness of right hip, not elsewhere classified: Secondary | ICD-10-CM

## 2021-10-31 NOTE — Therapy (Signed)
OUTPATIENT PHYSICAL THERAPY TREATMENT   Patient Name: Victoria Snyder MRN: [id number] DOB:[birth year], 56 y.o., female Today's Date: 10/31/2021   PT End of Session - 10/31/21 1759     Visit Number 2    Number of Visits 16    Date for PT Re-Evaluation 12/18/21    Authorization Type [employer]   (50 % co-insurance)    PT Start Time [phone]    PT Stop Time 1018    PT Time Calculation (min) 40 min    Activity Tolerance Patient tolerated treatment well    Behavior During Therapy WFL for tasks assessed/performed              Past Medical History:  Diagnosis Date   Bipolar disorder ([place])    Blood transfusion without reported diagnosis    Hypertension    Hypokalemia    Low back pain with radiation    Vertigo    Past Surgical History:  Procedure Laterality Date   ABDOMINAL HYSTERECTOMY     partial for fibroids   Patient Active Problem List   Diagnosis Date Noted   Cervicalgia 07/13/2021   Trochanteric bursitis, right hip 06/26/2021   Unilateral primary osteoarthritis, right hip 06/26/2021   DDD (degenerative disc disease), lumbar 12/17/2018   Spinal stenosis of lumbar region 12/17/2018   Hypertension 03/31/2018   Low back pain with radiation 03/31/2018   History of vertigo 03/31/2018   Anxiety and depression 03/31/2018   Bipolar disorder ([place]) 03/31/2018    PCP: Clerance Lav  REFERRING PROVIDER: Yetta Flock Allwardt  REFERRING DIAG: R hip pain, low back pain   Rationale for Evaluation and Treatment Rehabilitation  THERAPY DIAG:  Other low back pain  Pain in right hip  Stiffness of right hip, not elsewhere classified  Other abnormalities of gait and mobility  ONSET DATE:   SUBJECTIVE:                                                                                                                                                                                           SUBJECTIVE STATEMENT: No new complaints. Did walk yesterday, states pain down in lower leg on R,  and in hip after walking.   Eval: Pt states ongoing pain in back and R hip, for a few years.  States much stiffness in R hip and back, difficulty walking, getting up and down,  She has not worked in 4 years, but active around the house  Used to enjoy working out, not exercising now. Unable to walk for exercise due to pain.  Stairs: full flight to get in. Has railing  States she is candidate for  R THA in August ? Was told she would Likely will need on L as well in future.     PERTINENT HISTORY:    PAIN:  Are you having pain? Yes: NPRS scale: 8/10 Pain location:  Low Back R>L  Pain description: radiating into R Lower leg , stiff  Aggravating factors: bending, sitting too long.  Relieving factors: none stated    Are you having pain? Yes: NPRS scale: 8/10 Pain location: R hip  Pain description: sore, stiff  Aggravating factors: Initial standing, sitting too long. Relieving factors: none stated    PRECAUTIONS: None  WEIGHT BEARING RESTRICTIONS No  FALLS:  Has patient fallen in last 6 months? No  PLOF: Independent  PATIENT GOALS  decreased pain in hip and back, move better, do more activity.    OBJECTIVE: Eval:    COGNITION:  Overall cognitive status: Within functional limits for tasks assessed      POSTURE:  Standing: increased lumbar lordosis, mild knee valgus,  Supine: increased lumbar lordosis,   PALPATION: R hip hypomobile, tightness in lumbar musculature.   LUMBAR ROM:  Flexion: mod/significant limitation- 90 deg Ext: Sign limitation, pain SB: Mild/mod limitation  Hips: L: mild limitation for flex and ER,  R: mod limitation for flex, ER, Ext and IR with pain.  Knee: WFL PROM, some limitation for AROM of R knee due to guarding for R hip pain.    Strength:  L hip: flex/abd: 4/5 ,    R hip:  4-/5  L knee: 4/5,  R knee: 4/5    GAIT: Distance walked: 129f Assistive device utilized: None Level of assistance: Complete Independence Comments:  Significant gait deficits:  Lumbar Stiffness, with increased lordosis,  decreased hip extension R>L, decreased hip flexion with advancing limb, knee valgus bil, decreased knee extension with swing, decreased heel strike, and poor step height.    TODAY'S TREATMENT  10/31/2021 Therapeutic Exercise: Aerobic:  L1 x 5 min , slow  Supine: Pelvic tilts x 20; Hip ER fallouts x 10;  Seated: LAQ x 15 bil; sit to stand x 10 with education on mechanics,  Standing: Stretches: LTR x10,  SKTC 30 sec x 3 bil;  Neuromuscular Re-education: Manual Therapy: R hip long leg distraction, prone quad stretch, prone hip p-a mobs, PROM for hip IR/ER Self Care:   PATIENT EDUCATION:  Education details: updated and reviewed HEP,  Person educated: Patient Education method: Explanation, Demonstration, Tactile cues, Verbal cues, and Handouts Education comprehension: verbalized understanding, returned demonstration, verbal cues required, tactile cues required, and needs further education   HOME EXERCISE PROGRAM: Access Code: [email or web address]   ASSESSMENT:  CLINICAL IMPRESSION: Ther ex done today for back and hip mobility as well as LE strengthening. Sit to stand mechanics much improved after practice and education. Pt to benefit from progression on hip and back mobility and core/LE strength, for improved ability and efficiency for functional activity.    OBJECTIVE IMPAIRMENTS Abnormal gait, decreased activity tolerance, decreased mobility, difficulty walking, decreased ROM, decreased strength, impaired perceived functional ability, increased muscle spasms, impaired flexibility, improper body mechanics, and pain.   ACTIVITY LIMITATIONS carrying, lifting, bending, sitting, standing, squatting, stairs, transfers, and locomotion level  PARTICIPATION LIMITATIONS: meal prep, cleaning, laundry, driving, shopping, community activity, and yard work  PERSONAL FACTORS Time since onset of injury/illness/exacerbation are also  affecting patient's functional outcome.   REHAB POTENTIAL: Good  CLINICAL DECISION MAKING: Stable/uncomplicated  EVALUATION COMPLEXITY: Low   GOALS: Goals reviewed with patient? Yes  SHORT TERM GOALS: Target date:  6/13/023  Pt to be independent with initial HEP   Goal status: INITIAL  2. Pt to demo ability for optimal seated and supine posture with neutral spine and improved lordosis  Goal status: INITIAL  3.  Pt to demo ability for sit to stand with optimal mechanics.   Goal status: INITIAL   LONG TERM GOALS: Target date: 12/26/2021  Pt to be independent with final HEP  Goal status: INITIAL  2.  Pt to demo ability for improved lumbar flexion ROM to be [facility] for pt age.   Goal status: INITIAL  3.  Pt to demo improved strength of hips to at least 4+/5 to improve stability, gait, and stairs.   Goal status: INITIAL  4.  Pt to demo ambulation mechanics to be [facility] for pt age and diagnosis, to improve efficiency and ability for community activity.   Goal status: INITIAL     PLAN: PT FREQUENCY: 1-2x/week  PT DURATION: 8 weeks  PLANNED INTERVENTIONS: Therapeutic exercises, Therapeutic activity, Neuromuscular re-education, Gait training, Patient/Family education, Joint manipulation, Joint mobilization, Stair training, DME instructions, Dry Needling, Electrical stimulation, Spinal manipulation, Spinal mobilization, Cryotherapy, Moist heat, Taping, Vasopneumatic device, Traction, Ultrasound, Ionotophoresis '4mg'$ /ml Dexamethasone, and Manual therapy.  PLAN FOR NEXT SESSION:  Lumbar/hip  mobility,  sit to stands,  bike, seated posture, Supine march, bridging, hip flexor stretching   Lyndee Hensen, PT, DPT 6:01 PM  10/31/21

## 2021-11-07 ENCOUNTER — Ambulatory Visit (INDEPENDENT_AMBULATORY_CARE_PROVIDER_SITE_OTHER): Payer: Commercial Managed Care - HMO | Admitting: Physical Therapy

## 2021-11-07 ENCOUNTER — Emergency Department (HOSPITAL_BASED_OUTPATIENT_CLINIC_OR_DEPARTMENT_OTHER)
Admission: EM | Admit: 2021-11-07 | Discharge: 2021-11-07 | Disposition: A | Discharge status: Home / Self Care | Payer: Commercial Managed Care - HMO | Attending: Emergency Medicine | Admitting: Emergency Medicine

## 2021-11-07 ENCOUNTER — Encounter: Payer: Self-pay | Admitting: Physical Therapy

## 2021-11-07 ENCOUNTER — Other Ambulatory Visit: Payer: Self-pay

## 2021-11-07 ENCOUNTER — Telehealth: Payer: Self-pay | Admitting: Family Medicine

## 2021-11-07 ENCOUNTER — Emergency Department (HOSPITAL_BASED_OUTPATIENT_CLINIC_OR_DEPARTMENT_OTHER): Payer: Commercial Managed Care - HMO | Admitting: Radiology

## 2021-11-07 ENCOUNTER — Encounter (HOSPITAL_BASED_OUTPATIENT_CLINIC_OR_DEPARTMENT_OTHER): Payer: Self-pay | Admitting: Emergency Medicine

## 2021-11-07 DIAGNOSIS — R519 Headache, unspecified: Secondary | ICD-10-CM | POA: Diagnosis not present

## 2021-11-07 DIAGNOSIS — Y92410 Unspecified street and highway as the place of occurrence of the external cause: Secondary | ICD-10-CM | POA: Diagnosis not present

## 2021-11-07 DIAGNOSIS — S161XXA Strain of muscle, fascia and tendon at neck level, initial encounter: Secondary | ICD-10-CM | POA: Diagnosis not present

## 2021-11-07 DIAGNOSIS — M25551 Pain in right hip: Secondary | ICD-10-CM

## 2021-11-07 DIAGNOSIS — M25651 Stiffness of right hip, not elsewhere classified: Secondary | ICD-10-CM | POA: Diagnosis not present

## 2021-11-07 DIAGNOSIS — R2689 Other abnormalities of gait and mobility: Secondary | ICD-10-CM | POA: Diagnosis not present

## 2021-11-07 DIAGNOSIS — M5459 Other low back pain: Secondary | ICD-10-CM | POA: Diagnosis not present

## 2021-11-07 DIAGNOSIS — S199XXA Unspecified injury of neck, initial encounter: Secondary | ICD-10-CM | POA: Diagnosis present

## 2021-11-07 MED ORDER — CYCLOBENZAPRINE HCL 10 MG PO TABS: 10.0000 mg | ORAL_TABLET | Freq: Two times a day (BID) | ORAL | 0 refills | Status: DC | PRN

## 2021-11-07 MED ORDER — CYCLOBENZAPRINE HCL 10 MG PO TABS
10.0000 mg | ORAL_TABLET | Freq: Two times a day (BID) | ORAL | 0 refills | Status: DC | PRN
  Filled 2021-11-07: qty 15, 8d supply, fill #0
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
## 2021-11-07 NOTE — Therapy (Signed)
OUTPATIENT PHYSICAL THERAPY TREATMENT   Patient Name: MAHUM BETTEN MRN: [id number] DOB:[birth year], 56 y.o., female Today's Date: 11/07/2021   PT End of Session - 11/07/21 0922     Visit Number 3    Number of Visits 16    Date for PT Re-Evaluation 12/18/21    Authorization Type [employer]   (50 % co-insurance)    PT Start Time 0925    PT Stop Time 1010    PT Time Calculation (min) 45 min    Activity Tolerance Patient tolerated treatment well    Behavior During Therapy WFL for tasks assessed/performed              Past Medical History:  Diagnosis Date   Bipolar disorder ([place])    Blood transfusion without reported diagnosis    Hypertension    Hypokalemia    Low back pain with radiation    Vertigo    Past Surgical History:  Procedure Laterality Date   ABDOMINAL HYSTERECTOMY     partial for fibroids   Patient Active Problem List   Diagnosis Date Noted   Cervicalgia 07/13/2021   Trochanteric bursitis, right hip 06/26/2021   Unilateral primary osteoarthritis, right hip 06/26/2021   DDD (degenerative disc disease), lumbar 12/17/2018   Spinal stenosis of lumbar region 12/17/2018   Hypertension 03/31/2018   Low back pain with radiation 03/31/2018   History of vertigo 03/31/2018   Anxiety and depression 03/31/2018   Bipolar disorder ([place]) 03/31/2018    PCP: Clerance Lav  REFERRING PROVIDER: Yetta Flock Allwardt  REFERRING DIAG: R hip pain, low back pain   Rationale for Evaluation and Treatment Rehabilitation  THERAPY DIAG:  Other low back pain  Pain in right hip  Stiffness of right hip, not elsewhere classified  Other abnormalities of gait and mobility  ONSET DATE:   SUBJECTIVE:                                                                                                                                                                                           SUBJECTIVE STATEMENT: Pt states she has been walking a bit more. She notes much stiffness and  catching in R hip. Also notes a lot of pain in R lower leg, states this is painful at end of day and when hip is hurting.   Eval: Pt states ongoing pain in back and R hip, for a few years.  States much stiffness in R hip and back, difficulty walking, getting up and down,  She has not worked in 4 years, but active around the house  Used to enjoy working out, not exercising now. Unable  to walk for exercise due to pain.  Stairs: full flight to get in. Has railing  States she is candidate for R THA in August ? Was told she would Likely will need on L as well in future.     PERTINENT HISTORY:    PAIN:  Are you having pain? Yes: NPRS scale: 8/10 Pain location:  Low Back R>L  Pain description: radiating into R Lower leg , stiff  Aggravating factors: bending, sitting too long.  Relieving factors: none stated    Are you having pain? Yes: NPRS scale: 8/10 Pain location: R hip  Pain description: sore, stiff  Aggravating factors: Initial standing, sitting too long. Relieving factors: none stated    PRECAUTIONS: None  WEIGHT BEARING RESTRICTIONS No  FALLS:  Has patient fallen in last 6 months? No  PLOF: Independent  PATIENT GOALS  decreased pain in hip and back, move better, do more activity.    OBJECTIVE: Eval:    COGNITION:  Overall cognitive status: Within functional limits for tasks assessed      POSTURE:  Standing: increased lumbar lordosis, mild knee valgus,  Supine: increased lumbar lordosis,   PALPATION: R hip hypomobile, tightness in lumbar musculature.   LUMBAR ROM:  Flexion: mod/significant limitation- 90 deg Ext: Sign limitation, pain SB: Mild/mod limitation  Hips: L: mild limitation for flex and ER,  R: mod limitation for flex, ER, Ext and IR with pain.  Knee: WFL PROM, some limitation for AROM of R knee due to guarding for R hip pain.    Strength:  L hip: flex/abd: 4/5 ,    R hip:  4-/5  L knee: 4/5,  R knee: 4/5    GAIT: Distance walked:  141f Assistive device utilized: None Level of assistance: Complete Independence Comments: Significant gait deficits:  Lumbar Stiffness, with increased lordosis,  decreased hip extension R>L, decreased hip flexion with advancing limb, knee valgus bil, decreased knee extension with swing, decreased heel strike, and poor step height.    TODAY'S TREATMENT  11/07/2021 Therapeutic Exercise: Aerobic:  L1 x 8 min  Supine: Supine march x 15; TA x 10 with education on achieving contraction.  Seated: LAQ x 15 bil; sit to stand x 10 with education on mechanics/regular chair,  Standing:  Marching x 20 with focus on neutral spine, Staggered stance A/P weight shift for hip ext x 10 bil;  Step ups x 10 bil;  Stretches: SKTC 30 sec x 3 bil; Standing hip flexor stretch 30 sec x 2 bil;  Neuromuscular Re-education: Manual Therapy: R hip long leg distraction, prone quad stretch, prone hip p-a mobs, PROM for hip IR/ER Self Care:   PATIENT EDUCATION:  Education details: updated and reviewed HEP,  Person educated: Patient Education method: EConsulting civil engineer Demonstration, Tactile cues, Verbal cues, and Handouts Education comprehension: verbalized understanding, returned demonstration, verbal cues required, tactile cues required, and needs further education   HOME EXERCISE PROGRAM: Access Code: [email or web address]   ASSESSMENT:  CLINICAL IMPRESSION: Pt with tenderness in R lower shin with palpation, states this area is very sore in evening and when sleeping. Discussed using self massage to this area, as well as thigh, for decreasing muscle tension. Will continue to assess shin area for pain, but does not seem related to hip, may be more related to foot/gait pattern. Pt improving with functional mobility, transfers and gait mechanics, plan to progress as tolerated.    OBJECTIVE IMPAIRMENTS Abnormal gait, decreased activity tolerance, decreased mobility, difficulty walking, decreased ROM, decreased strength, impaired  perceived  functional ability, increased muscle spasms, impaired flexibility, improper body mechanics, and pain.   ACTIVITY LIMITATIONS carrying, lifting, bending, sitting, standing, squatting, stairs, transfers, and locomotion level  PARTICIPATION LIMITATIONS: meal prep, cleaning, laundry, driving, shopping, community activity, and yard work  PERSONAL FACTORS Time since onset of injury/illness/exacerbation are also affecting patient's functional outcome.   REHAB POTENTIAL: Good  CLINICAL DECISION MAKING: Stable/uncomplicated  EVALUATION COMPLEXITY: Low   GOALS: Goals reviewed with patient? Yes  SHORT TERM GOALS: Target date: 6/13/023  Pt to be independent with initial HEP   Goal status: INITIAL  2. Pt to demo ability for optimal seated and supine posture with neutral spine and improved lordosis  Goal status: INITIAL  3.  Pt to demo ability for sit to stand with optimal mechanics.   Goal status: INITIAL   LONG TERM GOALS: Target date: 01/02/2022  Pt to be independent with final HEP  Goal status: INITIAL  2.  Pt to demo ability for improved lumbar flexion ROM to be [facility] for pt age.   Goal status: INITIAL  3.  Pt to demo improved strength of hips to at least 4+/5 to improve stability, gait, and stairs.   Goal status: INITIAL  4.  Pt to demo ambulation mechanics to be [facility] for pt age and diagnosis, to improve efficiency and ability for community activity.   Goal status: INITIAL     PLAN: PT FREQUENCY: 1-2x/week  PT DURATION: 8 weeks  PLANNED INTERVENTIONS: Therapeutic exercises, Therapeutic activity, Neuromuscular re-education, Gait training, Patient/Family education, Joint manipulation, Joint mobilization, Stair training, DME instructions, Dry Needling, Electrical stimulation, Spinal manipulation, Spinal mobilization, Cryotherapy, Moist heat, Taping, Vasopneumatic device, Traction, Ultrasound, Ionotophoresis '4mg'$ /ml Dexamethasone, and Manual therapy.  PLAN FOR  NEXT SESSION:  Lumbar/hip  mobility,  sit to stands,  bike, seated posture, Supine march, bridging, hip flexor stretching   Lyndee Hensen, PT, DPT 12:20 PM  11/07/21
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
## 2021-11-07 NOTE — ED Notes (Signed)
Discharge instructions, follow up care, and prescriptions reviewed and explained, pt verbalized understanding. Pt caox4 and ambulatory upon departure.

## 2021-11-07 NOTE — Discharge Instructions (Addendum)
Return to the ER if you have worsening headache, new onset of vomiting, blurred vision, numbness or weakness in your arms or legs, or other the other new concerning symptoms.

## 2021-11-07 NOTE — Telephone Encounter (Signed)
FYI

## 2021-11-08 ENCOUNTER — Ambulatory Visit: Payer: Commercial Managed Care - HMO | Admitting: Family Medicine

## 2021-11-08 ENCOUNTER — Other Ambulatory Visit: Payer: Self-pay

## 2021-11-09 ENCOUNTER — Ambulatory Visit (INDEPENDENT_AMBULATORY_CARE_PROVIDER_SITE_OTHER): Payer: Commercial Managed Care - HMO | Admitting: Physician Assistant

## 2021-11-09 ENCOUNTER — Other Ambulatory Visit: Payer: Self-pay

## 2021-11-09 DIAGNOSIS — M5459 Other low back pain: Secondary | ICD-10-CM | POA: Diagnosis not present

## 2021-11-09 DIAGNOSIS — M25551 Pain in right hip: Secondary | ICD-10-CM

## 2021-11-09 MED ORDER — NAPROXEN 500 MG PO TABS
500.0000 mg | ORAL_TABLET | Freq: Two times a day (BID) | ORAL | 0 refills | Status: DC
  Filled 2021-11-09: qty 30, 15d supply, fill #0

## 2021-11-09 NOTE — Patient Instructions (Signed)
Stop the Celebrex as this isn't helping Start on Naproxen twice daily with food x 10-15 days, wouldn't do much longer than that consistently due to risk for stomach issues / kidney issues.   Cont flexeril for muscle spasms.  REST. Fluids.
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
## 2021-11-12 NOTE — Telephone Encounter (Signed)
FYI

## 2021-11-14 ENCOUNTER — Ambulatory Visit (INDEPENDENT_AMBULATORY_CARE_PROVIDER_SITE_OTHER): Payer: Commercial Managed Care - HMO | Admitting: Physician Assistant

## 2021-11-14 ENCOUNTER — Encounter: Payer: Self-pay | Admitting: Physical Therapy

## 2021-11-14 ENCOUNTER — Other Ambulatory Visit: Payer: Self-pay | Admitting: Physician Assistant

## 2021-11-14 ENCOUNTER — Other Ambulatory Visit: Payer: Self-pay

## 2021-11-14 ENCOUNTER — Encounter: Payer: Self-pay | Admitting: Physician Assistant

## 2021-11-14 ENCOUNTER — Ambulatory Visit (INDEPENDENT_AMBULATORY_CARE_PROVIDER_SITE_OTHER)
Admission: RE | Admit: 2021-11-14 | Discharge: 2021-11-14 | Disposition: A | Discharge status: Home / Self Care | Payer: Commercial Managed Care - HMO | Source: Ambulatory Visit | Attending: Physician Assistant | Admitting: Physician Assistant

## 2021-11-14 ENCOUNTER — Ambulatory Visit (INDEPENDENT_AMBULATORY_CARE_PROVIDER_SITE_OTHER): Payer: Commercial Managed Care - HMO | Admitting: Physical Therapy

## 2021-11-14 DIAGNOSIS — M25651 Stiffness of right hip, not elsewhere classified: Secondary | ICD-10-CM | POA: Diagnosis not present

## 2021-11-14 DIAGNOSIS — M25551 Pain in right hip: Secondary | ICD-10-CM | POA: Diagnosis not present

## 2021-11-14 DIAGNOSIS — R519 Headache, unspecified: Secondary | ICD-10-CM

## 2021-11-14 DIAGNOSIS — M542 Cervicalgia: Secondary | ICD-10-CM | POA: Diagnosis not present

## 2021-11-14 DIAGNOSIS — M5459 Other low back pain: Secondary | ICD-10-CM

## 2021-11-14 DIAGNOSIS — R2689 Other abnormalities of gait and mobility: Secondary | ICD-10-CM

## 2021-11-14 MED ORDER — METHOCARBAMOL 500 MG PO TABS
500.0000 mg | ORAL_TABLET | Freq: Three times a day (TID) | ORAL | 0 refills | Status: AC
  Filled 2021-11-14 – 2021-11-28 (×2): qty 15, 5d supply, fill #0

## 2021-11-14 MED ORDER — TRAMADOL HCL 50 MG PO TABS
50.0000 mg | ORAL_TABLET | Freq: Three times a day (TID) | ORAL | 0 refills | Status: AC | PRN
  Filled 2021-11-14 – 2021-11-28 (×2): qty 15, 5d supply, fill #0

## 2021-11-14 MED ORDER — METHYLPREDNISOLONE 4 MG PO TBPK
ORAL_TABLET | ORAL | 0 refills | Status: DC
  Filled 2021-11-14 – 2021-11-28 (×2): qty 21, 6d supply, fill #0

## 2021-11-14 NOTE — Patient Instructions (Addendum)
STOP  the cyclobenzaprine (flexeril) STOP naprosyn  START on Methocarbamol, Medrol dose pak, Tramadol (only for moderate to severe pain - mainly use before bed and after PT). Caution with driving / dizziness!   XRAY of neck needed.  PT on neck needed. Cont PT for hip.
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
## 2021-11-15 ENCOUNTER — Other Ambulatory Visit: Payer: Self-pay

## 2021-11-15 ENCOUNTER — Encounter (HOSPITAL_COMMUNITY): Payer: Self-pay | Admitting: Psychiatry

## 2021-11-15 ENCOUNTER — Telehealth (INDEPENDENT_AMBULATORY_CARE_PROVIDER_SITE_OTHER): Payer: No Payment, Other | Admitting: Psychiatry

## 2021-11-15 DIAGNOSIS — F314 Bipolar disorder, current episode depressed, severe, without psychotic features: Secondary | ICD-10-CM | POA: Diagnosis not present

## 2021-11-15 DIAGNOSIS — F419 Anxiety disorder, unspecified: Secondary | ICD-10-CM | POA: Diagnosis not present

## 2021-11-15 DIAGNOSIS — F32A Depression, unspecified: Secondary | ICD-10-CM | POA: Diagnosis not present

## 2021-11-15 MED ORDER — HYDROXYZINE HCL 10 MG PO TABS
10.0000 mg | ORAL_TABLET | Freq: Three times a day (TID) | ORAL | 3 refills | Status: DC | PRN
  Filled 2021-11-15: qty 90, 30d supply, fill #0

## 2021-11-15 MED ORDER — DULOXETINE HCL 40 MG PO CPEP
40.0000 mg | ORAL_CAPSULE | Freq: Two times a day (BID) | ORAL | 3 refills | Status: DC
  Filled 2021-11-15 – 2021-11-28 (×3): qty 60, 30d supply, fill #0

## 2021-11-15 MED ORDER — QUETIAPINE FUMARATE 50 MG PO TABS
50.0000 mg | ORAL_TABLET | Freq: Every day | ORAL | 3 refills | Status: DC
  Filled 2021-11-15: qty 30, 30d supply, fill #0

## 2021-11-15 MED ORDER — BUSPIRONE HCL 15 MG PO TABS
ORAL_TABLET | ORAL | 3 refills | Status: DC
  Filled 2021-11-15: qty 90, 30d supply, fill #0

## 2021-11-15 MED ORDER — TRAZODONE HCL 100 MG PO TABS
ORAL_TABLET | Freq: Every evening | ORAL | 3 refills | Status: DC | PRN
  Filled 2021-11-15 – 2021-11-28 (×2): qty 30, 30d supply, fill #0

## 2021-11-15 MED ORDER — QUETIAPINE FUMARATE 300 MG PO TABS
300.0000 mg | ORAL_TABLET | Freq: Every day | ORAL | 3 refills | Status: DC
  Filled 2021-11-15 – 2021-11-28 (×2): qty 30, 30d supply, fill #0

## 2021-11-15 MED ORDER — GABAPENTIN 400 MG PO CAPS
400.0000 mg | ORAL_CAPSULE | Freq: Three times a day (TID) | ORAL | 3 refills | Status: DC
  Filled 2021-11-15 – 2021-11-28 (×2): qty 90, 30d supply, fill #0
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
## 2021-11-16 ENCOUNTER — Other Ambulatory Visit: Payer: Self-pay

## 2021-11-20 ENCOUNTER — Other Ambulatory Visit: Payer: Self-pay

## 2021-11-21 ENCOUNTER — Encounter: Payer: Self-pay | Admitting: Physical Therapy

## 2021-11-21 ENCOUNTER — Ambulatory Visit (INDEPENDENT_AMBULATORY_CARE_PROVIDER_SITE_OTHER): Payer: Commercial Managed Care - HMO | Admitting: Physical Therapy

## 2021-11-21 DIAGNOSIS — M5459 Other low back pain: Secondary | ICD-10-CM

## 2021-11-21 DIAGNOSIS — R2689 Other abnormalities of gait and mobility: Secondary | ICD-10-CM | POA: Diagnosis not present

## 2021-11-21 DIAGNOSIS — M25651 Stiffness of right hip, not elsewhere classified: Secondary | ICD-10-CM

## 2021-11-21 DIAGNOSIS — M25551 Pain in right hip: Secondary | ICD-10-CM | POA: Diagnosis not present

## 2021-11-21 DIAGNOSIS — M542 Cervicalgia: Secondary | ICD-10-CM
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
## 2021-11-22 ENCOUNTER — Ambulatory Visit (INDEPENDENT_AMBULATORY_CARE_PROVIDER_SITE_OTHER): Payer: No Payment, Other | Admitting: Clinical

## 2021-11-22 ENCOUNTER — Other Ambulatory Visit: Payer: Self-pay

## 2021-11-22 DIAGNOSIS — F314 Bipolar disorder, current episode depressed, severe, without psychotic features: Secondary | ICD-10-CM | POA: Diagnosis not present
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
## 2021-11-26 ENCOUNTER — Other Ambulatory Visit: Payer: Self-pay

## 2021-11-28 ENCOUNTER — Other Ambulatory Visit: Payer: Self-pay | Admitting: Internal Medicine

## 2021-11-28 ENCOUNTER — Other Ambulatory Visit: Payer: Self-pay

## 2021-11-28 ENCOUNTER — Encounter: Payer: Self-pay | Admitting: Physical Therapy

## 2021-11-28 ENCOUNTER — Ambulatory Visit (INDEPENDENT_AMBULATORY_CARE_PROVIDER_SITE_OTHER): Payer: Commercial Managed Care - HMO | Admitting: Physical Therapy

## 2021-11-28 DIAGNOSIS — M25551 Pain in right hip: Secondary | ICD-10-CM | POA: Diagnosis not present

## 2021-11-28 DIAGNOSIS — I10 Essential (primary) hypertension: Secondary | ICD-10-CM

## 2021-11-28 DIAGNOSIS — M25651 Stiffness of right hip, not elsewhere classified: Secondary | ICD-10-CM | POA: Diagnosis not present

## 2021-11-28 DIAGNOSIS — M5459 Other low back pain: Secondary | ICD-10-CM | POA: Diagnosis not present
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
## 2021-11-29 ENCOUNTER — Other Ambulatory Visit: Payer: Self-pay

## 2021-12-04 ENCOUNTER — Other Ambulatory Visit: Payer: Self-pay

## 2021-12-04 ENCOUNTER — Ambulatory Visit: Payer: No Typology Code available for payment source | Admitting: Family Medicine

## 2021-12-05 ENCOUNTER — Encounter: Payer: Self-pay | Admitting: Physical Therapy

## 2021-12-05 ENCOUNTER — Other Ambulatory Visit: Payer: Self-pay

## 2021-12-05 ENCOUNTER — Encounter: Payer: Self-pay | Admitting: Family Medicine

## 2021-12-05 ENCOUNTER — Ambulatory Visit (INDEPENDENT_AMBULATORY_CARE_PROVIDER_SITE_OTHER): Payer: Commercial Managed Care - HMO | Admitting: Family Medicine

## 2021-12-05 ENCOUNTER — Ambulatory Visit (INDEPENDENT_AMBULATORY_CARE_PROVIDER_SITE_OTHER): Payer: Commercial Managed Care - HMO | Admitting: Physical Therapy

## 2021-12-05 VITALS — BP 130/80 | HR 63 | Temp 98.2°F | Ht 69.0 in | Wt 166.8 lb

## 2021-12-05 DIAGNOSIS — M542 Cervicalgia: Secondary | ICD-10-CM

## 2021-12-05 DIAGNOSIS — F314 Bipolar disorder, current episode depressed, severe, without psychotic features: Secondary | ICD-10-CM

## 2021-12-05 DIAGNOSIS — M25551 Pain in right hip: Secondary | ICD-10-CM | POA: Diagnosis not present

## 2021-12-05 DIAGNOSIS — I10 Essential (primary) hypertension: Secondary | ICD-10-CM | POA: Diagnosis not present

## 2021-12-05 DIAGNOSIS — M5459 Other low back pain: Secondary | ICD-10-CM | POA: Diagnosis not present

## 2021-12-05 DIAGNOSIS — R42 Dizziness and giddiness: Secondary | ICD-10-CM

## 2021-12-05 DIAGNOSIS — M25651 Stiffness of right hip, not elsewhere classified: Secondary | ICD-10-CM

## 2021-12-05 DIAGNOSIS — R2689 Other abnormalities of gait and mobility: Secondary | ICD-10-CM

## 2021-12-05 MED ORDER — AMLODIPINE BESYLATE 5 MG PO TABS
5.0000 mg | ORAL_TABLET | Freq: Every day | ORAL | 3 refills | Status: DC
  Filled 2021-12-05: qty 30, 30d supply, fill #0
  Filled 2022-01-18 – 2022-01-29 (×2): qty 30, 30d supply, fill #1
  Filled 2022-03-19: qty 30, 30d supply, fill #2
  Filled 2022-06-03: qty 30, 30d supply, fill #3
  Filled 2022-07-08: qty 30, 30d supply, fill #4
  Filled 2022-08-14: qty 30, 30d supply, fill #5
  Filled 2022-09-09 – 2022-10-03 (×2): qty 30, 30d supply, fill #6
  Filled 2022-10-28 – 2022-11-04 (×3): qty 30, 30d supply, fill #7
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
## 2021-12-06 ENCOUNTER — Ambulatory Visit (INDEPENDENT_AMBULATORY_CARE_PROVIDER_SITE_OTHER): Payer: No Payment, Other | Admitting: Clinical

## 2021-12-06 ENCOUNTER — Other Ambulatory Visit: Payer: Self-pay

## 2021-12-06 DIAGNOSIS — F314 Bipolar disorder, current episode depressed, severe, without psychotic features: Secondary | ICD-10-CM
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
## 2021-12-11 ENCOUNTER — Ambulatory Visit: Payer: Medicaid Other | Admitting: Family Medicine

## 2021-12-12 ENCOUNTER — Ambulatory Visit (INDEPENDENT_AMBULATORY_CARE_PROVIDER_SITE_OTHER): Payer: Commercial Managed Care - HMO | Admitting: Physical Therapy

## 2021-12-12 ENCOUNTER — Encounter: Payer: Self-pay | Admitting: Physical Therapy

## 2021-12-12 DIAGNOSIS — R2689 Other abnormalities of gait and mobility: Secondary | ICD-10-CM

## 2021-12-12 DIAGNOSIS — M25651 Stiffness of right hip, not elsewhere classified: Secondary | ICD-10-CM

## 2021-12-12 DIAGNOSIS — M5459 Other low back pain: Secondary | ICD-10-CM

## 2021-12-12 DIAGNOSIS — M25551 Pain in right hip: Secondary | ICD-10-CM | POA: Diagnosis not present

## 2021-12-12 DIAGNOSIS — M542 Cervicalgia: Secondary | ICD-10-CM
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
## 2021-12-13 ENCOUNTER — Ambulatory Visit: Payer: Self-pay

## 2021-12-13 ENCOUNTER — Ambulatory Visit (INDEPENDENT_AMBULATORY_CARE_PROVIDER_SITE_OTHER): Payer: Commercial Managed Care - HMO | Admitting: Surgery

## 2021-12-13 ENCOUNTER — Encounter: Payer: Self-pay | Admitting: Surgery

## 2021-12-13 DIAGNOSIS — M5416 Radiculopathy, lumbar region: Secondary | ICD-10-CM | POA: Diagnosis not present

## 2021-12-13 DIAGNOSIS — M5136 Other intervertebral disc degeneration, lumbar region: Secondary | ICD-10-CM | POA: Diagnosis not present

## 2021-12-13 DIAGNOSIS — M79604 Pain in right leg: Secondary | ICD-10-CM
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
## 2021-12-13 NOTE — Progress Notes (Signed)
R l

## 2021-12-18 ENCOUNTER — Ambulatory Visit (INDEPENDENT_AMBULATORY_CARE_PROVIDER_SITE_OTHER): Payer: Commercial Managed Care - HMO | Admitting: Family Medicine

## 2021-12-18 ENCOUNTER — Encounter: Payer: Self-pay | Admitting: Family Medicine

## 2021-12-18 VITALS — BP 122/70 | HR 76 | Temp 98.3°F | Ht 69.0 in | Wt 167.4 lb

## 2021-12-18 DIAGNOSIS — I10 Essential (primary) hypertension: Secondary | ICD-10-CM

## 2021-12-18 DIAGNOSIS — M5136 Other intervertebral disc degeneration, lumbar region: Secondary | ICD-10-CM

## 2021-12-18 DIAGNOSIS — F419 Anxiety disorder, unspecified: Secondary | ICD-10-CM | POA: Diagnosis not present

## 2021-12-18 DIAGNOSIS — M1611 Unilateral primary osteoarthritis, right hip: Secondary | ICD-10-CM

## 2021-12-18 DIAGNOSIS — F32A Depression, unspecified: Secondary | ICD-10-CM
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
## 2021-12-18 NOTE — Patient Instructions (Signed)
It was very nice to see you today!  Talk to psych about meds/sleep and hot flashes Eat a protein snack before bed.     PLEASE NOTE:  If you had any lab tests please let us know if you have not heard back within a few days. You may see your results on MyChart before we have a chance to review them but we will give you a call once they are reviewed by [place]. If we ordered any referrals today, please let us know if you have not heard from their office within the next week.   Please try these tips to maintain a healthy lifestyle:  Eat most of your calories during the day when you are active. Eliminate processed foods including packaged sweets (pies, cakes, cookies), reduce intake of potatoes, white bread, white pasta, and white rice. Look for whole grain options, oat flour or almond flour.  Each meal should contain half fruits/vegetables, one quarter protein, and one quarter carbs (no bigger than a computer mouse).  Cut down on sweet beverages. This includes juice, soda, and sweet tea. Also watch fruit intake, though this is a healthier sweet option, it still contains natural sugar! Limit to 3 servings daily.  Drink at least 1 glass of water with each meal and aim for at least 8 glasses per day  Exercise at least 150 minutes every week.

## 2021-12-19 ENCOUNTER — Encounter: Payer: Self-pay | Admitting: Physical Therapy

## 2021-12-19 ENCOUNTER — Ambulatory Visit (INDEPENDENT_AMBULATORY_CARE_PROVIDER_SITE_OTHER): Payer: Commercial Managed Care - HMO | Admitting: Physical Therapy

## 2021-12-19 DIAGNOSIS — R2689 Other abnormalities of gait and mobility: Secondary | ICD-10-CM

## 2021-12-19 DIAGNOSIS — M25651 Stiffness of right hip, not elsewhere classified: Secondary | ICD-10-CM | POA: Diagnosis not present

## 2021-12-19 DIAGNOSIS — M542 Cervicalgia: Secondary | ICD-10-CM

## 2021-12-19 DIAGNOSIS — M5459 Other low back pain: Secondary | ICD-10-CM

## 2021-12-19 DIAGNOSIS — M25551 Pain in right hip: Secondary | ICD-10-CM
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
## 2021-12-23 ENCOUNTER — Ambulatory Visit
Admission: RE | Admit: 2021-12-23 | Discharge: 2021-12-23 | Disposition: A | Discharge status: Home / Self Care | Payer: Commercial Managed Care - HMO | Source: Ambulatory Visit | Attending: Surgery | Admitting: Surgery

## 2021-12-23 DIAGNOSIS — M5416 Radiculopathy, lumbar region: Secondary | ICD-10-CM

## 2021-12-23 DIAGNOSIS — M79604 Pain in right leg: Secondary | ICD-10-CM

## 2021-12-23 DIAGNOSIS — M5136 Other intervertebral disc degeneration, lumbar region: Secondary | ICD-10-CM

## 2021-12-26 ENCOUNTER — Ambulatory Visit (INDEPENDENT_AMBULATORY_CARE_PROVIDER_SITE_OTHER): Payer: Commercial Managed Care - HMO | Admitting: Physical Therapy

## 2021-12-26 ENCOUNTER — Encounter: Payer: Self-pay | Admitting: Physical Therapy

## 2021-12-26 DIAGNOSIS — M25551 Pain in right hip: Secondary | ICD-10-CM

## 2021-12-26 DIAGNOSIS — M5459 Other low back pain: Secondary | ICD-10-CM

## 2021-12-26 DIAGNOSIS — M25651 Stiffness of right hip, not elsewhere classified: Secondary | ICD-10-CM

## 2021-12-26 DIAGNOSIS — M542 Cervicalgia: Secondary | ICD-10-CM

## 2021-12-26 DIAGNOSIS — R2689 Other abnormalities of gait and mobility: Secondary | ICD-10-CM | POA: Diagnosis not present

## 2021-12-26 NOTE — Therapy (Signed)
OUTPATIENT PHYSICAL THERAPY TREATMENT   Patient Name: Victoria Snyder MRN: [id number] DOB:[birth year], 56 y.o., female Today's Date: 12/26/2021   PT End of Session - 12/26/21 1201     Visit Number 10    Number of Visits 16    Date for PT Re-Evaluation 01/23/22    Authorization Type [employer]   (50 % co-insurance)    PT Start Time [phone]    PT Stop Time 1015    PT Time Calculation (min) 39 min    Activity Tolerance Patient tolerated treatment well    Behavior During Therapy WFL for tasks assessed/performed               Past Medical History:  Diagnosis Date   Bipolar disorder ([place])    Blood transfusion without reported diagnosis    Hypertension    Hypokalemia    Low back pain with radiation    Vertigo    Past Surgical History:  Procedure Laterality Date   ABDOMINAL HYSTERECTOMY     partial for fibroids   Patient Active Problem List   Diagnosis Date Noted   Cervicalgia 07/13/2021   Trochanteric bursitis, right hip 06/26/2021   Unilateral primary osteoarthritis, right hip 06/26/2021   DDD (degenerative disc disease), lumbar 12/17/2018   Spinal stenosis of lumbar region 12/17/2018   Hypertension 03/31/2018   Low back pain with radiation 03/31/2018   History of vertigo 03/31/2018   Anxiety and depression 03/31/2018   Bipolar disorder ([place]) 03/31/2018    PCP: Clerance Lav  REFERRING PROVIDER: Yetta Flock Allwardt  REFERRING DIAG: R hip pain, low back pain   Rationale for Evaluation and Treatment Rehabilitation  THERAPY DIAG:  Other low back pain  Pain in right hip  Stiffness of right hip, not elsewhere classified  Other abnormalities of gait and mobility  Cervicalgia  ONSET DATE:   SUBJECTIVE:                                                                                                                                                                                           SUBJECTIVE STATEMENT: 12/26/2021 Pt states less pain in neck. Feels she is  doing much better with that. She is having more pain in LE. Did have recent MRI, waiting for MD f/u for more information. THA scheduled for beginning of September.   Eval: Pt states ongoing pain in back and R hip, for a few years.  States much stiffness in R hip and back, difficulty walking, getting up and down,  She has not worked in 4 years, but active around the house  Used to enjoy working out, not exercising now.  Unable to walk for exercise due to pain.  Stairs: full flight to get in. Has railing  States she is candidate for R THA in August ? Was told she would Likely will need on L as well in future.    PERTINENT HISTORY:    PAIN:  Are you having pain? Yes: NPRS scale: 6/10 Pain location:  6 in the neck .   Pain description:   Aggravating factors: stress Relieving factors: none stated    Are you having pain? Yes: NPRS scale: 6/10 Pain location: R hip  Pain description: sore, stiff  Aggravating factors: Initial standing, sitting too long. Relieving factors: none stated     PRECAUTIONS: None  WEIGHT BEARING RESTRICTIONS No  FALLS:  Has patient fallen in last 6 months? No  PLOF: Independent  PATIENT GOALS  decreased pain in hip and back, move better, do more activity.    OBJECTIVE: updated 8/2   COGNITION:  Overall cognitive status: Within functional limits for tasks assessed      POSTURE:  Standing: increased lumbar lordosis, mild knee valgus,  Supine: increased lumbar lordosis,   PALPATION: R hip hypomobile, tightness in lumbar musculature.    LUMBAR ROM:  Flexion: mild imitation Ext: mid limitation, pain SB: Mild  limitation  Hips: L: mild limitation for flex and ER,  R: mod limitation for flex, ER, Ext and IR with pain.  Knee: WFL AROM,     Cervical ROM:   Flexion: WFL,   Extension: WFL  Rotation: 74 deg bil   Strength:  L hip: flex/abd: 4+/5 ,    R hip:  4+/5  L knee: 4+/5,  R knee: 4+/5      TODAY'S TREATMENT    12/26/21 Therapeutic Exercise: Aerobic:   Supine:  Bridging x 15;   Seated:  Shoulder pulley x 3 min/flexion; Cervical ROM, rot x 10, flex 10;  Standing:  shoulder - low row RTB x 20;  Stretches: pelvic tilts x 15; cat /cow x20;  seated fwd flexion stretch x 3;  Neuromuscular Re-education: Manual Therapy:  Self Care:    PATIENT EDUCATION:  Education details: updated and reviewed HEP ,  Person educated: Patient Education method: Explanation, Demonstration, Tactile cues, Verbal cues, and Handouts Education comprehension: verbalized understanding, returned demonstration, verbal cues required, tactile cues required, and needs further education   HOME EXERCISE PROGRAM: Access Code: [email or web address]   ASSESSMENT:  CLINICAL IMPRESSION: 12/26/2021  Pt has been seen for 10 visits. 4 initially with focus on hip, and 6 with more focus on neck.  Pt with improved tightness and soreness in neck and neck musculature. Doing very well with pain, and has improved ROM. She feels neck pain is doing very well. Discussed continuing to decrease muscle tension, stress levels, and do postural exercises. Hip continues to be bothersome due to stiffness and she is having increased pain in R lower leg, likley from back. She will f/u with Ortho from recent MRI, and will ask about intervention for her back prior to having hip surgery. She will likley require PT after THA scheduled at beginning of September. Pt ready for d/c to HEP at this time, pt in agreement with plan.   OBJECTIVE IMPAIRMENTS Abnormal gait, decreased activity tolerance, decreased mobility, difficulty walking, decreased ROM, decreased strength, impaired perceived functional ability, increased muscle spasms, impaired flexibility, improper body mechanics, and pain.   ACTIVITY LIMITATIONS carrying, lifting, bending, sitting, standing, squatting, stairs, transfers, and locomotion level  PARTICIPATION LIMITATIONS: meal prep, cleaning, laundry, driving,  shopping,  community activity, and yard work  PERSONAL FACTORS Time since onset of injury/illness/exacerbation are also affecting patient's functional outcome.   REHAB POTENTIAL: Good  CLINICAL DECISION MAKING: Stable/uncomplicated  EVALUATION COMPLEXITY: Low   GOALS: Goals reviewed with patient? Yes  SHORT TERM GOALS: Target date: 6/13/023  Pt to be independent with initial HEP   Goal status: MET  2. Pt to demo ability for optimal seated and supine posture with neutral spine and improved lordosis  Goal status: MET  3.  Pt to demo ability for sit to stand with optimal mechanics.   Goal status: MET   LONG TERM GOALS: Target date: 01/23/2022  Pt to be independent with final HEP  Goal status: MET  2.  Pt to demo ability for improved lumbar flexion ROM to be [facility] for pt age.   Goal status: MET  3.  Pt to demo improved strength of hips to at least 4+/5 to improve stability, gait, and stairs.   Goal status: MET  4.  Pt to demo ambulation mechanics to be [facility] for pt age and diagnosis, to improve efficiency and ability for community activity.   Goal status: IN PROGRESS/partially met   5. Pt to report decreased pain in neck to 0-2/10 nitial / Added 11/21/21  Goal: MET  6. Pt to demo full ROM of c-spine, to improve ability for IADLs.   Goals: MET     PLAN: PT FREQUENCY: 1-2x/week  PT DURATION: 6 weeks  PLANNED INTERVENTIONS: Therapeutic exercises, Therapeutic activity, Neuromuscular re-education, Gait training, Patient/Family education, Joint manipulation, Joint mobilization, Stair training, DME instructions, Dry Needling, Electrical stimulation, Spinal manipulation, Spinal mobilization, Cryotherapy, Moist heat, Taping, Vasopneumatic device, Traction, Ultrasound, Ionotophoresis 68m/ml Dexamethasone, and Manual therapy.  PLAN FOR NEXT SESSION:   LLyndee Hensen PT, DPT 12:05 PM  12/26/21    PHYSICAL THERAPY DISCHARGE SUMMARY  Visits from Start of Care:  10   Plan: Patient agrees to discharge.  Patient goals were  met. Patient is being discharged due to meeting the stated rehab goals.      LLyndee Hensen PT, DPT 12:14 PM  12/26/21

## 2021-12-31 ENCOUNTER — Encounter: Payer: Self-pay | Admitting: Physical Therapy

## 2022-01-02 ENCOUNTER — Other Ambulatory Visit: Payer: Self-pay

## 2022-01-02 ENCOUNTER — Ambulatory Visit: Payer: Commercial Managed Care - HMO | Admitting: Orthopaedic Surgery

## 2022-01-02 ENCOUNTER — Encounter: Payer: Self-pay | Admitting: Orthopaedic Surgery

## 2022-01-02 VITALS — BP 132/86 | HR 76 | Ht 69.0 in | Wt 166.0 lb

## 2022-01-02 DIAGNOSIS — M51369 Other intervertebral disc degeneration, lumbar region without mention of lumbar back pain or lower extremity pain: Secondary | ICD-10-CM

## 2022-01-02 DIAGNOSIS — M5136 Other intervertebral disc degeneration, lumbar region: Secondary | ICD-10-CM | POA: Diagnosis not present

## 2022-01-02 DIAGNOSIS — M1611 Unilateral primary osteoarthritis, right hip: Secondary | ICD-10-CM

## 2022-01-02 NOTE — Progress Notes (Signed)
Office Visit Note   Patient: Victoria Snyder           Date of Birth: [birth year]           MRN: [id number] Visit Date: 01/02/2022              Requested by: Tawnya Crook, MD [place],  [place] [id number] PCP: Tawnya Crook, MD   Assessment & Plan: Visit Diagnoses:  1. DDD (degenerative disc disease), lumbar   2. Unilateral primary osteoarthritis, right hip     Plan: Patient has mild spinal stenosis L3-4 and also L4-5 with chronic lateral recess narrowing on the left at L4-5.  Abnormal gait sequence is certainly not helping her lumbar symptoms but at least are stable from MRI 2020.  Will proceed with right total of arthroplasty as scheduled in December.  Pathophysiology lumbar disc degeneration discussed.  Follow-Up Instructions: No follow-ups on file.   Orders:  No orders of the defined types were placed in this encounter.  No orders of the defined types were placed in this encounter.     Procedures: No procedures performed   Clinical Data: No additional findings.   Subjective: Chief Complaint  Patient presents with   Lower Back - Pain, Follow-up    MRI review    HPI patient with bilateral hip osteoarthritis scheduled for left total hip arthroplasty in September returns post updated MRI scan lumbar from 2020 images.  Patient's husband is present today today we discussed total of arthroplasty on the right as well as reviewed her new updated lumbar MRI scan.  Review of Systems   Objective: Vital Signs: BP 132/86   Pulse 76   Ht '5\' 9"'$  (1.753 m)   Wt 166 lb (75.3 kg)   BMI 24.51 kg/m   Physical Exam Constitutional:      Appearance: She is well-developed.  HENT:     Head: Normocephalic.     Right Ear: External ear normal.     Left Ear: External ear normal. There is no impacted cerumen.  Eyes:     Pupils: Pupils are equal, round, and reactive to light.  Neck:     Thyroid: No thyromegaly.     Trachea: No tracheal deviation.   Cardiovascular:     Rate and Rhythm: Normal rate.  Pulmonary:     Effort: Pulmonary effort is normal.  Abdominal:     Palpations: Abdomen is soft.  Musculoskeletal:     Cervical back: No rigidity.  Skin:    General: Skin is warm and dry.  Neurological:     Mental Status: She is alert and oriented to person, place, and time.  Psychiatric:        Behavior: Behavior normal.     Ortho Exam patient can ambulate on heels and toes.  Some slight notch tenderness on the right negative on the left.  Pain with internal rotation right hip 20 degrees reproducing her groin pain and thigh pain that radiates down to her knees.  Less painful internal rotation on the left also 20 degrees.  External rotation 40 degrees right and left.  Anterior tib EHL is intact.  Patient is amatory with a right Trendelenburg gait and sometimes circumduct her foot.  Anterior tib and EHL is strong.  Specialty Comments:  No specialty comments available.  Imaging: Narrative & Impression  CLINICAL DATA:  Low back pain with symptoms persisting over 6 weeks of treatment   EXAM: MRI LUMBAR SPINE WITHOUT CONTRAST  TECHNIQUE: Multiplanar, multisequence MR imaging of the lumbar spine was performed. No intravenous contrast was administered.   COMPARISON:  10/26/2018   FINDINGS: Segmentation:  5 lumbar type vertebrae   Alignment:  Physiologic.   Vertebrae:  No fracture, evidence of discitis, or bone lesion.   Conus medullaris and cauda equina: Conus extends to the T12-L1 level. Conus and cauda equina appear normal.   Paraspinal and other soft tissues: Negative for perispinal mass or inflammation   Disc levels:   T12- L1: Unremarkable.   L1-L2: Mild facet spurring   L2-L3: Degenerative facet spurring. Mild disc space narrowing and bulging   L3-L4: Degenerative facet spurring which is bulky on the right. Mild disc space narrowing and bulging. Mild borderline moderate spinal stenosis mainly from  ligamentum flavum thickening and short pedicles   L4-L5: Disc narrowing and left eccentric bulging. Degenerative facet spurring on both sides. Chronic left paracentral protrusion contacting the left L5 nerve root. Patent foramina   L5-S1:Disc space narrowing and ridging. Negative facets. No neural impingement   IMPRESSION: 1. Multilevel disc and facet degeneration without focal or notable progression from 2020. 2. L3-4 and L4-5 mild spinal stenosis. 3. L4-5 chronic left subarticular recess narrowing with protrusion contacting the left L5 nerve root.     Electronically Signed   By: Jorje Guild M.D.   On: 12/24/2021 14:59     PMFS History: Patient Active Problem List   Diagnosis Date Noted   Cervicalgia 07/13/2021   Trochanteric bursitis, right hip 06/26/2021   Unilateral primary osteoarthritis, right hip 06/26/2021   DDD (degenerative disc disease), lumbar 12/17/2018   Spinal stenosis of lumbar region 12/17/2018   Hypertension 03/31/2018   Low back pain with radiation 03/31/2018   History of vertigo 03/31/2018   Anxiety and depression 03/31/2018   Bipolar disorder ([place]) 03/31/2018   Past Medical History:  Diagnosis Date   Bipolar disorder ([place])    Blood transfusion without reported diagnosis    Hypertension    Hypokalemia    Low back pain with radiation    Vertigo     Family History  Problem Relation Age of Onset   Heart disease Mother        heart attack   Stroke Father    Breast cancer Neg Hx     Past Surgical History:  Procedure Laterality Date   ABDOMINAL HYSTERECTOMY     partial for fibroids   Social History   Occupational History   Occupation: Unemployed  Tobacco Use   Smoking status: Former    Types: Cigarettes    Quit date: 2001    Years since quitting: 22.6   Smokeless tobacco: Never  Vaping Use   Vaping Use: Never used  Substance and Sexual Activity   Alcohol use: Yes    Comment: Drinks occasionally   Drug use: No   Sexual  activity: Yes    Birth control/protection: Surgical

## 2022-01-03 ENCOUNTER — Encounter: Payer: Commercial Managed Care - HMO | Admitting: Physical Therapy

## 2022-01-07 ENCOUNTER — Encounter: Payer: Self-pay | Admitting: Physical Therapy

## 2022-01-18 ENCOUNTER — Other Ambulatory Visit: Payer: Self-pay

## 2022-01-23 ENCOUNTER — Ambulatory Visit (INDEPENDENT_AMBULATORY_CARE_PROVIDER_SITE_OTHER): Payer: Commercial Managed Care - HMO | Admitting: Surgery

## 2022-01-23 VITALS — BP 136/90 | HR 73 | Ht 69.25 in | Wt 167.0 lb

## 2022-01-23 DIAGNOSIS — M1611 Unilateral primary osteoarthritis, right hip: Secondary | ICD-10-CM

## 2022-01-23 NOTE — Pre-Procedure Instructions (Signed)
Surgical Instructions    Your procedure is scheduled on Wednesday, September 6th.  Report to [facility] Main Entrance "A" at 10:30 A.M., then check in with the Admitting office.  Call this number if you have problems the morning of surgery:  [phone]   If you have any questions prior to your surgery date call [phone]: Open Monday-Friday 8am-4pm    Remember:  Do not eat after midnight the night before your surgery  You may drink clear liquids until 09:30 AM the morning of your surgery.   Clear liquids allowed are: Water, Non-Citrus Juices (without pulp), Carbonated Beverages, Clear Tea, Black Coffee Only (NO MILK, CREAM OR POWDERED CREAMER of any kind), and Gatorade.    Take these medicines the morning of surgery with A SIP OF WATER  amLODipine (NORVASC)  busPIRone (BUSPAR)  DULoxetine HCl gabapentin (NEURONTIN)    If needed: hydrOXYzine (ATARAX)  meclizine (ANTIVERT)   As of today, STOP taking any Aspirin (unless otherwise instructed by your surgeon) Aleve, Naproxen, Ibuprofen, Motrin, Advil, Goody's, BC's, all herbal medications, fish oil, and all vitamins. This includes diclofenac Sodium (VOLTAREN).                     Do NOT Smoke (Tobacco/Vaping) for 24 hours prior to your procedure.  If you use a CPAP at night, you may bring your mask/headgear for your overnight stay.   Contacts, glasses, piercing's, hearing aid's, dentures or partials may not be worn into surgery, please bring cases for these belongings.    For patients admitted to the hospital, discharge time will be determined by your treatment team.   Patients discharged the day of surgery will not be allowed to drive home, and someone needs to stay with them for 24 hours.  SURGICAL WAITING ROOM VISITATION Patients having surgery or a procedure may have no more than 2 support people in the waiting area - these visitors may rotate.   Children under the age of 64 must have an adult with them who is not the  patient. If the patient needs to stay at the hospital during part of their recovery, the visitor guidelines for inpatient rooms apply. Pre-op nurse will coordinate an appropriate time for 1 support person to accompany patient in pre-op.  This support person may not rotate.   Please refer to the [facility] website for the visitor guidelines for Inpatients (after your surgery is over and you are in a regular room).    Special instructions:   [facility]- Preparing For Surgery  Before surgery, you can play an important role. Because skin is not sterile, your skin needs to be as free of germs as possible. You can reduce the number of germs on your skin by washing with CHG (chlorahexidine gluconate) Soap before surgery.  CHG is an antiseptic cleaner which kills germs and bonds with the skin to continue killing germs even after washing.    Oral Hygiene is also important to reduce your risk of infection.  Remember - BRUSH YOUR TEETH THE MORNING OF SURGERY WITH YOUR REGULAR TOOTHPASTE  Please do not use if you have an allergy to CHG or antibacterial soaps. If your skin becomes reddened/irritated stop using the CHG.  Do not shave (including legs and underarms) for at least 48 hours prior to first CHG shower. It is OK to shave your face.  Please follow these instructions carefully.   Shower the NIGHT BEFORE SURGERY and the MORNING OF SURGERY  If you chose to wash  your hair, wash your hair first as usual with your normal shampoo.  After you shampoo, rinse your hair and body thoroughly to remove the shampoo.  Use CHG Soap as you would any other liquid soap. You can apply CHG directly to the skin and wash gently with a scrungie or a clean washcloth.   Apply the CHG Soap to your body ONLY FROM THE NECK DOWN.  Do not use on open wounds or open sores. Avoid contact with your eyes, ears, mouth and genitals (private parts). Wash Face and genitals (private parts)  with your normal soap.   Wash thoroughly,  paying special attention to the area where your surgery will be performed.  Thoroughly rinse your body with warm water from the neck down.  DO NOT shower/wash with your normal soap after using and rinsing off the CHG Soap.  Pat yourself dry with a CLEAN TOWEL.  Wear CLEAN PAJAMAS to bed the night before surgery  Place CLEAN SHEETS on your bed the night before your surgery  DO NOT SLEEP WITH PETS.   Day of Surgery: Take a shower with CHG soap. Do not wear jewelry or makeup Do not wear lotions, powders, perfumes, or deodorant. Do not shave 48 hours prior to surgery.   Do not bring valuables to the hospital.  [facility] is not responsible for any belongings or valuables. Do not wear nail polish, gel polish, artificial nails, or any other type of covering on natural nails (fingers and toes) If you have artificial nails or gel coating that need to be removed by a nail salon, please have this removed prior to surgery. Artificial nails or gel coating may interfere with anesthesia's ability to adequately monitor your vital signs. Wear Clean/Comfortable clothing the morning of surgery Remember to brush your teeth WITH YOUR REGULAR TOOTHPASTE.   Please read over the following fact sheets that you were given.    If you received a COVID test during your pre-op visit  it is requested that you wear a mask when out in public, stay away from anyone that may not be feeling well and notify your surgeon if you develop symptoms. If you have been in contact with anyone that has tested positive in the last 10 days please notify you surgeon.

## 2022-01-23 NOTE — Progress Notes (Signed)
`   56 year old white female history of end-stage DJD right hip and pain comes in for prep evaluation.  Hip symptoms unchanged from previous visit.  Wants to proceed with right shoulder replacement as scheduled.  Today history physical performed.  Surgical procedure discussed.

## 2022-01-24 ENCOUNTER — Other Ambulatory Visit: Payer: Self-pay

## 2022-01-24 ENCOUNTER — Encounter (HOSPITAL_COMMUNITY)
Admission: RE | Admit: 2022-01-24 | Discharge: 2022-01-24 | Disposition: A | Discharge status: Home / Self Care | Payer: Commercial Managed Care - HMO | Source: Ambulatory Visit | Attending: Orthopaedic Surgery | Admitting: Orthopaedic Surgery

## 2022-01-24 ENCOUNTER — Encounter (HOSPITAL_COMMUNITY): Payer: Self-pay

## 2022-01-24 VITALS — BP 122/86 | HR 71 | Temp 98.4°F | Resp 17 | Ht 69.0 in | Wt 167.3 lb

## 2022-01-24 DIAGNOSIS — Z01818 Encounter for other preprocedural examination: Secondary | ICD-10-CM | POA: Diagnosis not present

## 2022-01-24 DIAGNOSIS — R5383 Other fatigue: Secondary | ICD-10-CM | POA: Insufficient documentation

## 2022-01-24 DIAGNOSIS — I4510 Unspecified right bundle-branch block: Secondary | ICD-10-CM | POA: Diagnosis not present

## 2022-01-24 DIAGNOSIS — R42 Dizziness and giddiness: Secondary | ICD-10-CM | POA: Insufficient documentation

## 2022-01-24 DIAGNOSIS — R9431 Abnormal electrocardiogram [ECG] [EKG]: Secondary | ICD-10-CM | POA: Diagnosis not present

## 2022-01-24 DIAGNOSIS — I498 Other specified cardiac arrhythmias: Secondary | ICD-10-CM | POA: Insufficient documentation

## 2022-01-24 DIAGNOSIS — I10 Essential (primary) hypertension: Secondary | ICD-10-CM | POA: Diagnosis not present

## 2022-01-24 HISTORY — DX: Unspecified osteoarthritis, unspecified site: M19.90

## 2022-01-24 HISTORY — DX: Anxiety disorder, unspecified: F41.9

## 2022-01-24 LAB — BASIC METABOLIC PANEL
Anion gap: 8 (ref 5–15)
BUN: 10 mg/dL (ref 6–20)
CO2: 28 mmol/L (ref 22–32)
Calcium: 10.3 mg/dL (ref 8.9–10.3)
Chloride: 106 mmol/L (ref 98–111)
Creatinine, Ser: 1.02 mg/dL — ABNORMAL HIGH (ref 0.44–1.00)
GFR, Estimated: >60 mL/min (ref >60)
Glucose, Bld: 107 mg/dL — ABNORMAL HIGH (ref 70–99)
Potassium: 4.6 mmol/L (ref 3.5–5.1)
Sodium: 142 mmol/L (ref 135–145)

## 2022-01-24 LAB — SURGICAL PCR SCREEN
MRSA, PCR: NEGATIVE
Staphylococcus aureus: NEGATIVE

## 2022-01-24 LAB — TYPE AND SCREEN
ABO/RH(D): O POS
Antibody Screen: NEGATIVE

## 2022-01-24 LAB — CBC
HCT: 43.3 % (ref 36.0–46.0)
Hemoglobin: 15 g/dL (ref 12.0–15.0)
MCH: 31.9 pg (ref 26.0–34.0)
MCHC: 34.6 g/dL (ref 30.0–36.0)
MCV: 92.1 fL (ref 80.0–100.0)
Platelets: 268 10*3/uL (ref 150–400)
RBC: 4.7 MIL/uL (ref 3.87–5.11)
RDW: 12.1 % (ref 11.5–15.5)
WBC: 4.9 10*3/uL (ref 4.0–10.5)
nRBC: 0 % (ref 0.0–0.2)

## 2022-01-24 NOTE — Progress Notes (Signed)
PCP - Dr. Esther Hardy Cardiologist - denies. Last saw Dr. Einar Gip in 2019  PPM/ICD - n/a  Chest x-ray - n/a EKG - 01/24/22 Stress Test - 2019? Requested records from Dr. Irven Shelling office. ECHO - denies Cardiac Cath - denies  Sleep Study - denies CPAP - denies  Blood Thinner Instructions: n/a Aspirin Instructions: n/a  ERAS Protcol -Clear liquids until 0930 DOS. PRE-SURGERY Ensure or G2- none ordered.  COVID TEST- n/a  Anesthesia review: Yes, ekg review and pending cardiac records  Patient denies shortness of breath, fever, cough and chest pain at PAT appointment   All instructions explained to the patient, with a verbal understanding of the material. Patient agrees to go over the instructions while at home for a better understanding. Patient also instructed to self quarantine after being tested for COVID-19. The opportunity to ask questions was provided.

## 2022-01-25 ENCOUNTER — Other Ambulatory Visit: Payer: Self-pay

## 2022-01-25 NOTE — Progress Notes (Signed)
Anesthesia Chart Review:  Patient had cardiology evaluation in 2019 for palpitations and abnormal EKG.  Work-up included exercise treadmill test with excellent exercise capacity, but inferolateral ischemic ST changes.  As a follow-up, she underwent coronary CT angiogram that showed normal coronary arteries with no significant coronary artery disease, calcium score of 0.  Echocardiogram showed structurally normal heart, trace pericardial effusion felt to be of no clinical significance.  Event monitor did not show any significant rhythms.  Last seen by Dr. Einar Gip 12/24/2017.  Per note, "false positive treadmill stress test.  CT angiogram shows normal coronaries and symptoms score 0, which is reassuring."  Advised her to follow-up with cardiology on an as-needed basis.  Preop labs reviewed, unremarkable.  EKG 01/24/2022: Sinus rhythm with sinus arrhythmia.  Right bundle branch block.  Rate 68.  Coronary CT 12/12/2017: IMPRESSION: 1. No evidence of significant coronary artery atherosclerosis. Patient's total coronary artery calcium score is 0 which indicates a very low (but non-zero) risk of major adverse cardiovascular events over the next 10 years. 2. No significant incidental noncardiac findings are noted.  TTE 12/09/2017: Left ventricle cavity is normal in size.  Normal global wall motion.  Normal diastolic filling pattern.  Calculated EF 74%.  Inadequate tricuspid regurgitation jet to estimate pulmonary artery pressure.  Normal right atrial pressure.  Insignificant pericardial effusion.    Treadmill stress test 11/24/2017: The patient exercised on Bruce protocol for 10 minutes and 6 seconds.  Patient achieved 11.98 METS and reached heart rate 174 bpm, which is 104% of maximum age-predicted heart rate.  Stress test terminated due to fatigue.  Exercise capacity was normal. Heart rate response to exercise: Appropriate. BP response to exercise: Normal resting BP and appropriate response. Chest pain:  None.  Arrhythmias: None. ST changes: With peak exercise there were up to 3 mm upsloping/horizontal ST depressions in leads II, 3, aVF, V4 through V6, that returned to normal at 2 minutes into recovery.  Overall impression: Positive stress test suggestive of ischemia.  Event monitor 11/20/2017: Dominant rhythm is sinus.  Occasional sinus tachycardia/sinus arrhythmia.  No atrial fibrillation, atrial flutter, SVT, high-grade AV block seen.  Symptoms of lightheadedness reported with sinus rhythm/tachycardia.   [name] [facility]/Anesthesiology Phone [phone] 01/25/2022 1:47 PM

## 2022-01-25 NOTE — Anesthesia Preprocedure Evaluation (Addendum)
Anesthesia Evaluation  Patient identified by MRN, date of birth, ID band Patient awake    Reviewed: Allergy & Precautions, NPO status , Patient's Chart, lab work & pertinent test results  Airway Mallampati: I  TM Distance: >3 FB Neck ROM: Full    Dental  (+) Teeth Intact, Dental Advisory Given   Pulmonary former smoker   Pulmonary exam normal breath sounds clear to auscultation       Cardiovascular hypertension (140s/90s in preop), Pt. on medications Normal cardiovascular exam Rhythm:Regular Rate:Normal  had cardiology evaluation in 2019 for palpitations and abnormal EKG.  Work-up included exercise treadmill test with excellent exercise capacity, but inferolateral ischemic ST changes.  As a follow-up, she underwent coronary CT angiogram that showed normal coronary arteries with no significant coronary artery disease, calcium score of 0.  Echocardiogram showed structurally normal heart, trace pericardial effusion felt to be of no clinical significance.  Event monitor did not show any significant rhythms.  Last seen by Dr. Einar Gip 12/24/2017.  Per note, "false positive treadmill stress test.  CT angiogram shows normal coronaries and symptoms score 0, which is reassuring."  Advised her to follow-up with cardiology on an as-needed basis.    TTE 12/09/2017: Left ventricle cavity is normal in size.  Normal global wall motion.  Normal diastolic filling pattern.  Calculated EF 74%.  Inadequate tricuspid regurgitation jet to estimate pulmonary artery pressure.  Normal right atrial pressure.  Insignificant pericardial effusion.      Neuro/Psych  PSYCHIATRIC DISORDERS Anxiety Depression Bipolar Disorder   negative neurological ROS     GI/Hepatic negative GI ROS, Neg liver ROS,,,  Endo/Other  negative endocrine ROS    Renal/GU negative Renal ROS  negative genitourinary   Musculoskeletal  (+) Arthritis , Osteoarthritis,  R hip OA     Abdominal   Peds  Hematology negative hematology ROS (+) Hb 15   Anesthesia Other Findings   Reproductive/Obstetrics negative OB ROS                             Anesthesia Physical Anesthesia Plan  ASA: 2  Anesthesia Plan: Spinal and MAC   Post-op Pain Management:    Induction:   PONV Risk Score and Plan: 2 and Propofol infusion, TIVA and Treatment may vary due to age or medical condition  Airway Management Planned: Natural Airway and Nasal Cannula  Additional Equipment: None  Intra-op Plan:   Post-operative Plan:   Informed Consent: I have reviewed the patients History and Physical, chart, labs and discussed the procedure including the risks, benefits and alternatives for the proposed anesthesia with the patient or authorized representative who has indicated his/her understanding and acceptance.     Dental advisory given  Plan Discussed with: CRNA  Anesthesia Plan Comments:         Anesthesia Quick Evaluation

## 2022-01-26 ENCOUNTER — Other Ambulatory Visit: Payer: Self-pay | Admitting: Physician Assistant

## 2022-01-29 ENCOUNTER — Other Ambulatory Visit: Payer: Self-pay

## 2022-01-30 ENCOUNTER — Encounter (HOSPITAL_COMMUNITY)
Admission: RE | Disposition: A | Discharge status: Home / Self Care | Payer: Self-pay | Source: Home / Self Care | Attending: Orthopaedic Surgery

## 2022-01-30 ENCOUNTER — Other Ambulatory Visit: Payer: Self-pay

## 2022-01-30 ENCOUNTER — Ambulatory Visit (HOSPITAL_BASED_OUTPATIENT_CLINIC_OR_DEPARTMENT_OTHER): Payer: Commercial Managed Care - HMO

## 2022-01-30 ENCOUNTER — Observation Stay (HOSPITAL_COMMUNITY): Payer: Commercial Managed Care - HMO

## 2022-01-30 ENCOUNTER — Ambulatory Visit (HOSPITAL_COMMUNITY): Payer: Commercial Managed Care - HMO | Admitting: Physician Assistant

## 2022-01-30 ENCOUNTER — Ambulatory Visit (HOSPITAL_COMMUNITY): Payer: Commercial Managed Care - HMO

## 2022-01-30 ENCOUNTER — Observation Stay (HOSPITAL_COMMUNITY)
Admission: RE | Admit: 2022-01-30 | Discharge: 2022-01-31 | Disposition: A | Discharge status: Home / Self Care | Payer: Commercial Managed Care - HMO | Attending: Orthopaedic Surgery | Admitting: Orthopaedic Surgery

## 2022-01-30 ENCOUNTER — Encounter (HOSPITAL_COMMUNITY): Payer: Self-pay | Admitting: Orthopaedic Surgery

## 2022-01-30 DIAGNOSIS — M1611 Unilateral primary osteoarthritis, right hip: Principal | ICD-10-CM | POA: Insufficient documentation

## 2022-01-30 DIAGNOSIS — Z79899 Other long term (current) drug therapy: Secondary | ICD-10-CM | POA: Diagnosis not present

## 2022-01-30 DIAGNOSIS — Z96641 Presence of right artificial hip joint: Secondary | ICD-10-CM

## 2022-01-30 DIAGNOSIS — Z87891 Personal history of nicotine dependence: Secondary | ICD-10-CM | POA: Diagnosis not present

## 2022-01-30 DIAGNOSIS — I10 Essential (primary) hypertension: Secondary | ICD-10-CM | POA: Insufficient documentation

## 2022-01-30 HISTORY — PX: TOTAL HIP ARTHROPLASTY: SHX124

## 2022-01-30 SURGERY — ARTHROPLASTY, HIP, TOTAL, ANTERIOR APPROACH
Anesthesia: Monitor Anesthesia Care | Site: Hip | Laterality: Right

## 2022-01-30 MED ORDER — PHENOL 1.4 % MT LIQD: 1.0000 | OROMUCOSAL | Status: DC | PRN

## 2022-01-30 MED ORDER — DULOXETINE HCL 20 MG PO CPEP
40.0000 mg | ORAL_CAPSULE | Freq: Two times a day (BID) | ORAL | Status: DC
  Administered 2022-01-30 – 2022-01-31 (×2): 40 mg via ORAL
  Filled 2022-01-30 (×4): qty 2

## 2022-01-30 MED ORDER — BUSPIRONE HCL 15 MG PO TABS
15.0000 mg | ORAL_TABLET | Freq: Three times a day (TID) | ORAL | Status: DC
Start: 2022-01-30 — End: 2022-02-01
  Administered 2022-01-30 – 2022-01-31 (×3): 15 mg via ORAL
  Filled 2022-01-30 (×3): qty 1

## 2022-01-30 MED ORDER — PHENYLEPHRINE 80 MCG/ML (10ML) SYRINGE FOR IV PUSH (FOR BLOOD PRESSURE SUPPORT)
PREFILLED_SYRINGE | INTRAVENOUS | Status: AC
  Filled 2022-01-30: qty 10

## 2022-01-30 MED ORDER — MECLIZINE HCL 25 MG PO TABS
25.0000 mg | ORAL_TABLET | Freq: Three times a day (TID) | ORAL | Status: DC | PRN
Start: 2022-01-30 — End: 2022-02-01

## 2022-01-30 MED ORDER — BUPIVACAINE HCL (PF) 0.5 % IJ SOLN
INTRAMUSCULAR | Status: AC
  Filled 2022-01-30: qty 30

## 2022-01-30 MED ORDER — PHENYLEPHRINE HCL-NACL 20-0.9 MG/250ML-% IV SOLN
INTRAVENOUS | Status: DC | PRN
  Administered 2022-01-30: 35 ug/min via INTRAVENOUS

## 2022-01-30 MED ORDER — PHENYLEPHRINE 80 MCG/ML (10ML) SYRINGE FOR IV PUSH (FOR BLOOD PRESSURE SUPPORT)
PREFILLED_SYRINGE | INTRAVENOUS | Status: DC | PRN
  Administered 2022-01-30: 120 ug via INTRAVENOUS
  Administered 2022-01-30: 240 ug via INTRAVENOUS
  Administered 2022-01-30 (×2): 120 ug via INTRAVENOUS
  Administered 2022-01-30: 80 ug via INTRAVENOUS

## 2022-01-30 MED ORDER — SODIUM CHLORIDE 0.9 % IV SOLN: INTRAVENOUS | Status: DC

## 2022-01-30 MED ORDER — MENTHOL 3 MG MT LOZG
1.0000 | LOZENGE | OROMUCOSAL | Status: DC | PRN
Start: 2022-01-30 — End: 2022-02-01

## 2022-01-30 MED ORDER — ONDANSETRON HCL 4 MG/2ML IJ SOLN: 4.0000 mg | Freq: Four times a day (QID) | INTRAMUSCULAR | Status: DC | PRN

## 2022-01-30 MED ORDER — ACETAMINOPHEN 325 MG PO TABS: 325.0000 mg | ORAL_TABLET | Freq: Four times a day (QID) | ORAL | Status: DC | PRN

## 2022-01-30 MED ORDER — QUETIAPINE FUMARATE 300 MG PO TABS
300.0000 mg | ORAL_TABLET | Freq: Every day | ORAL | Status: DC
Start: 2022-01-30 — End: 2022-02-01
  Administered 2022-01-30: 300 mg via ORAL
  Filled 2022-01-30 (×3): qty 1

## 2022-01-30 MED ORDER — ASPIRIN 325 MG PO TBEC
325.0000 mg | DELAYED_RELEASE_TABLET | Freq: Every day | ORAL | 0 refills | Status: DC
  Filled 2022-01-30: qty 30, 30d supply, fill #0

## 2022-01-30 MED ORDER — EPHEDRINE 5 MG/ML INJ
INTRAVENOUS | Status: AC
  Filled 2022-01-30: qty 5

## 2022-01-30 MED ORDER — CHLORHEXIDINE GLUCONATE 0.12 % MT SOLN
15.0000 mL | Freq: Once | OROMUCOSAL | Status: AC
  Administered 2022-01-30: 15 mL via OROMUCOSAL
  Filled 2022-01-30: qty 15

## 2022-01-30 MED ORDER — MIDAZOLAM HCL 2 MG/2ML IJ SOLN
INTRAMUSCULAR | Status: AC
  Filled 2022-01-30: qty 2

## 2022-01-30 MED ORDER — FENTANYL CITRATE (PF) 100 MCG/2ML IJ SOLN
INTRAMUSCULAR | Status: AC
  Filled 2022-01-30: qty 2

## 2022-01-30 MED ORDER — METHOCARBAMOL 1000 MG/10ML IJ SOLN: 500.0000 mg | Freq: Four times a day (QID) | INTRAVENOUS | Status: DC | PRN

## 2022-01-30 MED ORDER — POLYETHYLENE GLYCOL 3350 17 G PO PACK
17.0000 g | PACK | Freq: Every day | ORAL | Status: DC | PRN
Start: 2022-01-30 — End: 2022-02-01

## 2022-01-30 MED ORDER — OXYCODONE-ACETAMINOPHEN 5-325 MG PO TABS
1.0000 | ORAL_TABLET | Freq: Four times a day (QID) | ORAL | 0 refills | Status: DC | PRN
  Filled 2022-01-30: qty 40, 5d supply, fill #0

## 2022-01-30 MED ORDER — CEFAZOLIN SODIUM-DEXTROSE 2-4 GM/100ML-% IV SOLN
2.0000 g | INTRAVENOUS | Status: AC
  Administered 2022-01-30: 2 g via INTRAVENOUS
  Filled 2022-01-30: qty 100

## 2022-01-30 MED ORDER — LIDOCAINE 2% (20 MG/ML) 5 ML SYRINGE
INTRAMUSCULAR | Status: AC
  Filled 2022-01-30: qty 5

## 2022-01-30 MED ORDER — BUPIVACAINE IN DEXTROSE 0.75-8.25 % IT SOLN
INTRATHECAL | Status: DC | PRN
  Administered 2022-01-30: 1.8 mL via INTRATHECAL

## 2022-01-30 MED ORDER — ASPIRIN 325 MG PO TBEC
325.0000 mg | DELAYED_RELEASE_TABLET | Freq: Every day | ORAL | Status: DC
  Administered 2022-01-31: 325 mg via ORAL
  Filled 2022-01-30 (×2): qty 1

## 2022-01-30 MED ORDER — TRANEXAMIC ACID-NACL 1000-0.7 MG/100ML-% IV SOLN
1000.0000 mg | INTRAVENOUS | Status: AC
Start: 2022-01-30 — End: 2022-01-30
  Administered 2022-01-30: 1000 mg via INTRAVENOUS
  Filled 2022-01-30: qty 100

## 2022-01-30 MED ORDER — METHOCARBAMOL 500 MG PO TABS
500.0000 mg | ORAL_TABLET | Freq: Four times a day (QID) | ORAL | 0 refills | Status: DC | PRN
  Filled 2022-01-30: qty 60, 15d supply, fill #0

## 2022-01-30 MED ORDER — ACETAMINOPHEN 500 MG PO TABS
1000.0000 mg | ORAL_TABLET | Freq: Once | ORAL | Status: AC
  Administered 2022-01-30: 1000 mg via ORAL
  Filled 2022-01-30: qty 2

## 2022-01-30 MED ORDER — OXYCODONE HCL 5 MG PO TABS
5.0000 mg | ORAL_TABLET | ORAL | Status: DC | PRN
  Administered 2022-01-30: 10 mg via ORAL
  Administered 2022-01-31: 5 mg via ORAL
  Filled 2022-01-30 (×2): qty 2

## 2022-01-30 MED ORDER — ONDANSETRON HCL 4 MG/2ML IJ SOLN: 4.0000 mg | Freq: Once | INTRAMUSCULAR | Status: DC | PRN

## 2022-01-30 MED ORDER — ONDANSETRON HCL 4 MG PO TABS: 4.0000 mg | ORAL_TABLET | Freq: Four times a day (QID) | ORAL | Status: DC | PRN

## 2022-01-30 MED ORDER — PROPOFOL 1000 MG/100ML IV EMUL
INTRAVENOUS | Status: AC
  Filled 2022-01-30: qty 100

## 2022-01-30 MED ORDER — ORAL CARE MOUTH RINSE
15.0000 mL | Freq: Once | OROMUCOSAL | Status: AC
Start: 2022-01-30 — End: 2022-01-30

## 2022-01-30 MED ORDER — OXYCODONE HCL 5 MG/5ML PO SOLN: 5.0000 mg | Freq: Once | ORAL | Status: DC | PRN

## 2022-01-30 MED ORDER — AMLODIPINE BESYLATE 5 MG PO TABS
5.0000 mg | ORAL_TABLET | Freq: Every day | ORAL | Status: DC
Start: 2022-01-31 — End: 2022-02-01
  Administered 2022-01-31: 5 mg via ORAL
  Filled 2022-01-30: qty 1

## 2022-01-30 MED ORDER — OXYCODONE HCL 5 MG PO TABS: 5.0000 mg | ORAL_TABLET | Freq: Once | ORAL | Status: DC | PRN

## 2022-01-30 MED ORDER — HYDROXYZINE HCL 10 MG PO TABS
10.0000 mg | ORAL_TABLET | Freq: Three times a day (TID) | ORAL | Status: DC | PRN
Start: 2022-01-30 — End: 2022-02-01
  Filled 2022-01-30: qty 1

## 2022-01-30 MED ORDER — LACTATED RINGERS IV SOLN: INTRAVENOUS | Status: DC

## 2022-01-30 MED ORDER — FENTANYL CITRATE (PF) 250 MCG/5ML IJ SOLN
INTRAMUSCULAR | Status: AC
  Filled 2022-01-30: qty 5

## 2022-01-30 MED ORDER — 0.9 % SODIUM CHLORIDE (POUR BTL) OPTIME
TOPICAL | Status: DC | PRN
  Administered 2022-01-30: 1000 mL

## 2022-01-30 MED ORDER — PROPOFOL 10 MG/ML IV BOLUS
INTRAVENOUS | Status: DC | PRN
  Administered 2022-01-30: 30 mg via INTRAVENOUS
  Administered 2022-01-30 (×2): 20 mg via INTRAVENOUS

## 2022-01-30 MED ORDER — HYDROMORPHONE HCL 1 MG/ML IJ SOLN
0.5000 mg | INTRAMUSCULAR | Status: DC | PRN
  Administered 2022-01-30: 0.5 mg via INTRAVENOUS
  Filled 2022-01-30: qty 0.5

## 2022-01-30 MED ORDER — TRAZODONE HCL 50 MG PO TABS
100.0000 mg | ORAL_TABLET | Freq: Every evening | ORAL | Status: DC | PRN
Start: 2022-01-30 — End: 2022-02-01

## 2022-01-30 MED ORDER — AMISULPRIDE (ANTIEMETIC) 5 MG/2ML IV SOLN
10.0000 mg | Freq: Once | INTRAVENOUS | Status: DC | PRN
Start: 2022-01-30 — End: 2022-01-30

## 2022-01-30 MED ORDER — BUPIVACAINE LIPOSOME 1.3 % IJ SUSP
10.0000 mL | Freq: Once | INTRAMUSCULAR | Status: DC
  Filled 2022-01-30: qty 10

## 2022-01-30 MED ORDER — METOCLOPRAMIDE HCL 5 MG/ML IJ SOLN: 5.0000 mg | Freq: Three times a day (TID) | INTRAMUSCULAR | Status: DC | PRN

## 2022-01-30 MED ORDER — METOCLOPRAMIDE HCL 5 MG PO TABS: 5.0000 mg | ORAL_TABLET | Freq: Three times a day (TID) | ORAL | Status: DC | PRN

## 2022-01-30 MED ORDER — GABAPENTIN 400 MG PO CAPS
400.0000 mg | ORAL_CAPSULE | Freq: Three times a day (TID) | ORAL | Status: DC
Start: 2022-01-30 — End: 2022-02-01
  Administered 2022-01-30 – 2022-01-31 (×2): 400 mg via ORAL
  Filled 2022-01-30 (×3): qty 1

## 2022-01-30 MED ORDER — BUPIVACAINE LIPOSOME 1.3 % IJ SUSP
INTRAMUSCULAR | Status: AC
  Filled 2022-01-30: qty 20

## 2022-01-30 MED ORDER — PROPOFOL 500 MG/50ML IV EMUL
INTRAVENOUS | Status: DC | PRN
  Administered 2022-01-30: 75 ug/kg/min via INTRAVENOUS

## 2022-01-30 MED ORDER — MIDAZOLAM HCL 2 MG/2ML IJ SOLN
INTRAMUSCULAR | Status: DC | PRN
  Administered 2022-01-30: 2 mg via INTRAVENOUS

## 2022-01-30 MED ORDER — DOCUSATE SODIUM 100 MG PO CAPS
100.0000 mg | ORAL_CAPSULE | Freq: Two times a day (BID) | ORAL | Status: DC
  Administered 2022-01-30 – 2022-01-31 (×2): 100 mg via ORAL
  Filled 2022-01-30 (×2): qty 1

## 2022-01-30 MED ORDER — BUPIVACAINE LIPOSOME 1.3 % IJ SUSP
INTRAMUSCULAR | Status: DC | PRN
  Administered 2022-01-30: 10 mL

## 2022-01-30 MED ORDER — METHOCARBAMOL 500 MG PO TABS
500.0000 mg | ORAL_TABLET | Freq: Four times a day (QID) | ORAL | Status: DC | PRN
  Administered 2022-01-30: 500 mg via ORAL
  Filled 2022-01-30: qty 1

## 2022-01-30 MED ORDER — HYDROMORPHONE HCL 1 MG/ML IJ SOLN: 0.2500 mg | INTRAMUSCULAR | Status: DC | PRN

## 2022-01-30 MED ORDER — BUPIVACAINE HCL 0.5 % IJ SOLN
INTRAMUSCULAR | Status: DC | PRN
  Administered 2022-01-30: 10 mL

## 2022-01-30 SURGICAL SUPPLY — 58 items
APL SKNCLS STERI-STRIP NONHPOA (GAUZE/BANDAGES/DRESSINGS)
BAG COUNTER SPONGE SURGICOUNT (BAG) ×2 IMPLANT
BAG SPNG CNTER NS LX DISP (BAG) ×1
BALL HIP CERAMIC 32MM PLUS 9 IMPLANT
BENZOIN TINCTURE PRP APPL 2/3 (GAUZE/BANDAGES/DRESSINGS) ×2 IMPLANT
BLADE CLIPPER SURG (BLADE) IMPLANT
BLADE SAW SGTL 18X1.27X75 (BLADE) ×2 IMPLANT
COVER PERINEAL POST (MISCELLANEOUS) ×2 IMPLANT
COVER SURGICAL LIGHT HANDLE (MISCELLANEOUS) ×2 IMPLANT
CUP SECTOR GRIPTON 50MM (Cup) IMPLANT
DRAPE C-ARM 42X72 X-RAY (DRAPES) ×2 IMPLANT
DRAPE IMP U-DRAPE 54X76 (DRAPES) ×2 IMPLANT
DRAPE STERI IOBAN 125X83 (DRAPES) ×2 IMPLANT
DRAPE U-SHAPE 47X51 STRL (DRAPES) ×6 IMPLANT
DRSG MEPILEX BORDER 4X12 (GAUZE/BANDAGES/DRESSINGS) IMPLANT
DRSG MEPILEX BORDER 4X8 (GAUZE/BANDAGES/DRESSINGS) ×2 IMPLANT
DURAPREP 26ML APPLICATOR (WOUND CARE) ×2 IMPLANT
ELECT BLADE 4.0 EZ CLEAN MEGAD (MISCELLANEOUS)
ELECT CAUTERY BLADE 6.4 (BLADE) ×2 IMPLANT
ELECT REM PT RETURN 9FT ADLT (ELECTROSURGICAL) ×1
ELECTRODE BLDE 4.0 EZ CLN MEGD (MISCELLANEOUS) IMPLANT
ELECTRODE REM PT RTRN 9FT ADLT (ELECTROSURGICAL) ×2 IMPLANT
ELIMINATOR HOLE APEX DEPUY (Hips) IMPLANT
FACESHIELD WRAPAROUND (MASK) ×2 IMPLANT
FACESHIELD WRAPAROUND OR TEAM (MASK) ×4 IMPLANT
GLOVE BIOGEL PI IND STRL 8 (GLOVE) ×4 IMPLANT
GLOVE ORTHO TXT STRL SZ7.5 (GLOVE) ×4 IMPLANT
GOWN STRL REUS W/ TWL LRG LVL3 (GOWN DISPOSABLE) ×2 IMPLANT
GOWN STRL REUS W/ TWL XL LVL3 (GOWN DISPOSABLE) ×2 IMPLANT
GOWN STRL REUS W/TWL 2XL LVL3 (GOWN DISPOSABLE) ×2 IMPLANT
GOWN STRL REUS W/TWL LRG LVL3 (GOWN DISPOSABLE) ×2
GOWN STRL REUS W/TWL XL LVL3 (GOWN DISPOSABLE) ×1
HIP BALL CERAMIC 32MM PLUS 9 ×1 IMPLANT
KIT BASIN OR (CUSTOM PROCEDURE TRAY) ×2 IMPLANT
KIT TURNOVER KIT B (KITS) ×2 IMPLANT
LINER ACET PNNCL PLUS4 NEUTRAL (Hips) IMPLANT
MANIFOLD NEPTUNE II (INSTRUMENTS) ×2 IMPLANT
NDL HYPO 21X1 ECLIPSE (NEEDLE) ×2 IMPLANT
NEEDLE HYPO 21X1 ECLIPSE (NEEDLE) ×1 IMPLANT
NS IRRIG 1000ML POUR BTL (IV SOLUTION) ×2 IMPLANT
PACK TOTAL JOINT (CUSTOM PROCEDURE TRAY) ×2 IMPLANT
PAD ARMBOARD 7.5X6 YLW CONV (MISCELLANEOUS) ×4 IMPLANT
PINNACLE PLUS 4 NEUTRAL (Hips) ×1 IMPLANT
PULSAVAC PLUS IRRIG FAN TIP (DISPOSABLE)
STEM FEM ACTIS HIGH SZ3 (Stem) IMPLANT
STRIP CLOSURE SKIN 1/2X4 (GAUZE/BANDAGES/DRESSINGS) ×2 IMPLANT
SUT VIC AB 0 CT1 27 (SUTURE) ×1
SUT VIC AB 0 CT1 27XBRD ANBCTR (SUTURE) ×2 IMPLANT
SUT VIC AB 2-0 CT1 27 (SUTURE) ×1
SUT VIC AB 2-0 CT1 TAPERPNT 27 (SUTURE) ×2 IMPLANT
SUT VICRYL 4-0 PS2 18IN ABS (SUTURE) ×2 IMPLANT
SUT VLOC 180 0 24IN GS25 (SUTURE) ×2 IMPLANT
SYR 20CC LL (SYRINGE) ×2 IMPLANT
TIP FAN IRRIG PULSAVAC PLUS (DISPOSABLE) IMPLANT
TOWEL GREEN STERILE (TOWEL DISPOSABLE) ×4 IMPLANT
TOWEL GREEN STERILE FF (TOWEL DISPOSABLE) ×2 IMPLANT
TRAY FOLEY MTR SLVR 16FR STAT (SET/KITS/TRAYS/PACK) ×2 IMPLANT
WATER STERILE IRR 1000ML POUR (IV SOLUTION) ×4 IMPLANT

## 2022-01-30 NOTE — Discharge Instructions (Signed)
INSTRUCTIONS AFTER JOINT REPLACEMENT   Remove items at home which could result in a fall. This includes throw rugs or furniture in walking pathways ICE to the affected joint every three hours while awake for 30 minutes at a time, for at least the first 3-5 days, and then as needed for pain and swelling.  Continue to use ice for pain and swelling. You may notice swelling that will progress down to the foot and ankle.  This is normal after surgery.  Elevate your leg when you are not up walking on it.   Continue to use the breathing machine you got in the hospital (incentive spirometer) which will help keep your temperature down.  It is common for your temperature to cycle up and down following surgery, especially at night when you are not up moving around and exerting yourself.  The breathing machine keeps your lungs expanded and your temperature down.   DIET:  As you were doing prior to hospitalization, we recommend a well-balanced diet.  DRESSING / WOUND CARE / SHOWERING  Keep the surgical dressing until follow up.  The dressing is water proof, so you can shower without any extra covering.  IF THE DRESSING FALLS OFF or the wound gets wet inside, change the dressing with sterile gauze.  Please use good hand washing techniques before changing the dressing.  Do not use any lotions or creams on the incision until instructed by your surgeon.    ACTIVITY  Increase activity slowly as tolerated, but follow the weight bearing instructions below.   No driving for 6 weeks or until further direction given by your physician.  You cannot drive while taking narcotics.  No lifting or carrying greater than 10 lbs. until further directed by your surgeon. Avoid periods of inactivity such as sitting longer than an hour when not asleep. This helps prevent blood clots.  You may return to work once you are authorized by your doctor.     WEIGHT BEARING   Weight bearing as tolerated with assist device (walker, cane,  etc) as directed, use it as long as suggested by your surgeon or therapist, typically at least 4-6 weeks.   EXERCISES  Results after joint replacement surgery are often greatly improved when you follow the exercise, range of motion and muscle strengthening exercises prescribed by your doctor. Safety measures are also important to protect the joint from further injury. Any time any of these exercises cause you to have increased pain or swelling, decrease what you are doing until you are comfortable again and then slowly increase them. If you have problems or questions, call your caregiver or physical therapist for advice.   Rehabilitation is important following a joint replacement. After just a few days of immobilization, the muscles of the leg can become weakened and shrink (atrophy).  These exercises are designed to build up the tone and strength of the thigh and leg muscles and to improve motion. Often times heat used for twenty to thirty minutes before working out will loosen up your tissues and help with improving the range of motion but do not use heat for the first two weeks following surgery (sometimes heat can increase post-operative swelling).    A rehabilitation program following joint replacement surgery can speed recovery and prevent re-injury in the future due to weakened muscles. Contact your doctor or a physical therapist for more information on knee rehabilitation.    CONSTIPATION  Constipation is defined medically as fewer than three stools per week and severe  constipation as less than one stool per week.  Even if you have a regular bowel pattern at home, your normal regimen is likely to be disrupted due to multiple reasons following surgery.  Combination of anesthesia, postoperative narcotics, change in appetite and fluid intake all can affect your bowels.   YOU MUST use at least one of the following options; they are listed in order of increasing strength to get the job done.   They are all available over the counter, and you may need to use some, POSSIBLY even all of these options:    Drink plenty of fluids (prune juice may be helpful) and high fiber foods Colace 100 mg by mouth twice a day  Senokot for constipation as directed and as needed Dulcolax (bisacodyl), take with full glass of water  Miralax (polyethylene glycol) once or twice a day as needed.  If you have tried all these things and are unable to have a bowel movement in the first 3-4 days after surgery call either your surgeon or your primary doctor.    If you experience loose stools or diarrhea, hold the medications until you stool forms back up.  If your symptoms do not get better within 1 week or if they get worse, check with your doctor.  If you experience "the worst abdominal pain ever" or develop nausea or vomiting, please contact the office immediately for further recommendations for treatment.   ITCHING:  If you experience itching with your medications, try taking only a single pain pill, or even half a pain pill at a time.  You can also use Benadryl over the counter for itching or also to help with sleep.   TED HOSE STOCKINGS:  Use stockings on both legs until for at least 2 weeks or as directed by physician office. They may be removed at night for sleeping.  MEDICATIONS:  See your medication summary on the "After Visit Summary" that nursing will review with you.  You may have some home medications which will be placed on hold until you complete the course of blood thinner medication.  It is important for you to complete the blood thinner medication as prescribed.  PRECAUTIONS:  If you experience chest pain or shortness of breath - call 911 immediately for transfer to the hospital emergency department.   If you develop a fever greater that 101 F, purulent drainage from wound, increased redness or drainage from wound, foul odor from the wound/dressing, or calf pain - CONTACT YOUR SURGEON.                                                    FOLLOW-UP APPOINTMENTS:  If you do not already have a post-op appointment, please call the office for an appointment to be seen by your surgeon.  Guidelines for how soon to be seen are listed in your "After Visit Summary", but are typically between 1-4 weeks after surgery.  OTHER INSTRUCTIONS:   Knee Replacement:  Do not place pillow under knee, focus on keeping the knee straight while resting. CPM instructions: 0-90 degrees, 2 hours in the morning, 2 hours in the afternoon, and 2 hours in the evening. Place foam block, curve side up under heel at all times except when in CPM or when walking.  DO NOT modify, tear, cut, or change the foam block in any  way.  POST-OPERATIVE OPIOID TAPER INSTRUCTIONS: It is important to wean off of your opioid medication as soon as possible. If you do not need pain medication after your surgery it is ok to stop day one. Opioids include: Codeine, Hydrocodone(Norco, Vicodin), Oxycodone(Percocet, oxycontin) and hydromorphone amongst others.  Long term and even short term use of opiods can cause: Increased pain response Dependence Constipation Depression Respiratory depression And more.  Withdrawal symptoms can include Flu like symptoms Nausea, vomiting And more Techniques to manage these symptoms Hydrate well Eat regular healthy meals Stay active Use relaxation techniques(deep breathing, meditating, yoga) Do Not substitute Alcohol to help with tapering If you have been on opioids for less than two weeks and do not have pain than it is ok to stop all together.  Plan to wean off of opioids This plan should start within one week post op of your joint replacement. Maintain the same interval or time between taking each dose and first decrease the dose.  Cut the total daily intake of opioids by one tablet each day Next start to increase the time between doses. The last dose that should be eliminated is the evening dose.    MAKE SURE YOU:  Understand these instructions.  Get help right away if you are not doing well or get worse.    Thank you for letting us be a part of your medical care team.  It is a privilege we respect greatly.  We hope these instructions will help you stay on track for a fast and full recovery!     Dental Antibiotics:  In most cases prophylactic antibiotics for Dental procdeures after total joint surgery are not necessary.  Exceptions are as follows:  1. History of prior total joint infection  2. Severely immunocompromised (Organ Transplant, cancer chemotherapy, Rheumatoid biologic meds such as Ware)  3. Poorly controlled diabetes (A1C &gt; 8.0, blood glucose over 200)  If you have one of these conditions, contact your surgeon for an antibiotic prescription, prior to your dental procedure.

## 2022-01-30 NOTE — Interval H&P Note (Signed)
History and Physical Interval Note:  01/30/2022 12:21 PM  Victoria Snyder  has presented today for surgery, with the diagnosis of right hip osteoarthritis.  The various methods of treatment have been discussed with the patient and family. After consideration of risks, benefits and other options for treatment, the patient has consented to  Procedure(s): RIGHT TOTAL HIP ARTHROPLASTY ANTERIOR APPROACH (Right) as a surgical intervention.  The patient's history has been reviewed, patient examined, no change in status, stable for surgery.  I have reviewed the patient's chart and labs.  Questions were answered to the patient's satisfaction.     [name]

## 2022-01-30 NOTE — H&P (Signed)
TOTAL HIP ADMISSION H&P  Patient is admitted for right total hip arthroplasty.  Subjective:  Chief Complaint: right hip pain  HPI: Victoria Snyder, 56 y.o. female, has a history of pain and functional disability in the right hip(s) due to arthritis and patient has failed non-surgical conservative treatments for greater than 12 weeks to include NSAID's and/or analgesics, use of assistive devices, weight reduction as appropriate, and activity modification.  Onset of symptoms was gradual starting 10 years ago with gradually worsening course since that time.The patient noted no past surgery on the right hip(s).  Patient currently rates pain in the right hip at 10 out of 10 with activity. Patient has night pain, worsening of pain with activity and weight bearing, trendelenberg gait, pain that interfers with activities of daily living, and pain with passive range of motion. Patient has evidence of subchondral sclerosis, periarticular osteophytes, and joint space narrowing by imaging studies. This condition presents safety issues increasing the risk of falls. .  There is no current active infection.  Patient Active Problem List   Diagnosis Date Noted   Cervicalgia 07/13/2021   Trochanteric bursitis, right hip 06/26/2021   Unilateral primary osteoarthritis, right hip 06/26/2021   DDD (degenerative disc disease), lumbar 12/17/2018   Spinal stenosis of lumbar region 12/17/2018   Hypertension 03/31/2018   Low back pain with radiation 03/31/2018   History of vertigo 03/31/2018   Anxiety and depression 03/31/2018   Bipolar disorder ([place]) 03/31/2018   Past Medical History:  Diagnosis Date   Anxiety    Arthritis    Bipolar disorder ([place])    Blood transfusion without reported diagnosis    Hypertension    Hypokalemia    Low back pain with radiation    Vertigo     Past Surgical History:  Procedure Laterality Date   ABDOMINAL HYSTERECTOMY     partial for fibroids   EXPLORATORY LAPAROTOMY  2011    mass in abdomen removed-uterine leiomyomata    Current Facility-Administered Medications  Medication Dose Route Frequency Provider Last Rate Last Admin   bupivacaine liposome (EXPAREL) 1.3 % injection 133 mg  10 mL Infiltration Once Benjiman Core M, PA-C       ceFAZolin (ANCEF) IVPB 2g/100 mL premix  2 g Intravenous On Call to OR Lanae Crumbly, PA-C       lactated ringers infusion   Intravenous Continuous Annye Asa, MD 10 mL/hr at 01/30/22 1036 New Bag at 01/30/22 1036   tranexamic acid (CYKLOKAPRON) IVPB 1,000 mg  1,000 mg Intravenous To OR Lanae Crumbly, PA-C       No Known Allergies  Social History   Tobacco Use   Smoking status: Former    Types: Cigarettes    Quit date: 2001    Years since quitting: 22.6   Smokeless tobacco: Never  Substance Use Topics   Alcohol use: Yes    Comment: Drinks occasionally    Family History  Problem Relation Age of Onset   Heart disease Mother        heart attack   Stroke Father    Breast cancer Neg Hx      Review of Systems  Constitutional:  Positive for activity change.  HENT: Negative.    Respiratory: Negative.    Gastrointestinal: Negative.   Genitourinary: Negative.   Musculoskeletal:  Positive for gait problem.    Objective:  Physical Exam HENT:     Head: Normocephalic and atraumatic.     Nose: Nose normal.  Eyes:  Extraocular Movements: Extraocular movements intact.  Cardiovascular:     Rate and Rhythm: Normal rate and regular rhythm.  Pulmonary:     Effort: Pulmonary effort is normal. No respiratory distress.     Breath sounds: Normal breath sounds.  Neurological:     General: No focal deficit present.     Mental Status: She is alert.     Vital signs in last 24 hours: Temp:  [98 F (36.7 C)] 98 F (36.7 C) (09/06 1015) Pulse Rate:  [72] 72 (09/06 1015) Resp:  [20] 20 (09/06 1015) BP: (149)/(97) 149/97 (09/06 1015) SpO2:  [99 %] 99 % (09/06 1015) Weight:  [75.8 kg] 75.8 kg (09/06  1015)  Labs:   Estimated body mass index is 24.48 kg/m as calculated from the following:   Height as of this encounter: 5' 9.25" (1.759 m).   Weight as of this encounter: 75.8 kg.   Imaging Review Plain radiographs demonstrate moderate degenerative joint disease of the right hip(s). The bone quality appears to be good for age and reported activity level.      Assessment/Plan:  End stage arthritis, right hip(s)  The patient history, physical examination, clinical judgement of the provider and imaging studies are consistent with end stage degenerative joint disease of the right hip(s) and total hip arthroplasty is deemed medically necessary. The treatment options including medical management, injection therapy, arthroscopy and arthroplasty were discussed at length. The risks and benefits of total hip arthroplasty were presented and reviewed. The risks due to aseptic loosening, infection, stiffness, dislocation/subluxation,  thromboembolic complications and other imponderables were discussed.  The patient acknowledged the explanation, agreed to proceed with the plan and consent was signed. Patient is being admitted for inpatient treatment for surgery, pain control, PT, OT, prophylactic antibiotics, VTE prophylaxis, progressive ambulation and ADL's and discharge planning.The patient is planning to be discharged home with home health services

## 2022-01-30 NOTE — Op Note (Signed)
Pre and postop diagnosis: Right hip primary osteoarthritis  Procedure: Right total hip arthroplasty, direct anterior approach  Surgeon: Rodell Perna, MD  Assistant: Benjiman Core, PA-C medically necessary and present for the entire procedure  Anesthesia spinal plus Exparel and Marcaine 10 +10 cc.  Implants:Implants  CUP SECTOR GRIPTON 50MM - [id number]  Inventory Item: CUP SECTOR GRIPTON 50MM Serial no.:  Model/Cat no.: [id number]  Implant name: CUP SECTOR GRIPTON 50MM - [id number] Laterality: Right Area: Hip  Manufacturer: [place] Date of Manufacture:    Action: Implanted Number Used: 1   Device Identifier:  Device Identifier TypeBrock Bad APEX DEPUY - [id number]  Inventory Item: ELIMINATOR HOLE APEX DEPUY Serial no.:  Model/Cat no.: [id number]  Implant nameBrock Bad APEX DEPUY - [id number] Laterality: Right Area: Hip  Manufacturer: [place] Date of Manufacture:    Action: Implanted Number Used: 1   Device Identifier:  Device Identifier Type:     PINNACLE PLUS 4 NEUTRAL - [id number]  Inventory Item: PINNACLE PLUS 4 NEUTRAL Serial no.:  Model/Cat no.: [id number]  Implant name: PINNACLE PLUS 4 NEUTRAL - [id number] Laterality: Right Area: Hip  Manufacturer: [place] Date of Manufacture:    Action: Implanted Number Used: 1   Device Identifier:  Device Identifier Type:     STEM FEM ACTIS HIGH SZ3 - [id number]  Inventory Item: STEM FEM ACTIS HIGH SZ3 Serial no.:  Model/Cat no.: [id number]  Implant name: STEM FEM ACTIS HIGH SZ3 - [id number] Laterality: Right Area: Hip  Manufacturer: [place] Date of Manufacture:    Action: Implanted Number Used: 1   Device Identifier:  Device Identifier Type:     HIP BALL CERAMIC 32MM PLUS 9 - [id number]  Inventory Item: HIP BALL CERAMIC 32MM PLUS 9 Serial no.:  Model/Cat no.: [id number]  Implant name: HIP BALL CERAMIC 32MM PLUS 9 - [id number] Laterality: Right Area: Hip  Manufacturer:  [place] Date of Manufacture:    Action: Implanted Number Used: 1   Device Identifier:  Device Identifier Type:    Procedure: After induction of anesthesia spinal Hana boot application Foley catheter placement patient placed on the Hana table 1015 drapes were applied C-arm was used for visualization and both hips are able to be well visualized to assess leg length Intra-Op with trials.  Hip was prepped with DuraPrep the area had 1015 drapes applied followed by large shower curtain Steri-Drape.  Timeout procedure was completed IV TXA.  Incision was made 2 fingerbreadths lateral 1 inferior to the ASIS obliquely toward the trochanter fascia was nicked extended.  Bovie was used for small subcutaneous vein bleeding.  After fascia was elevated and a blunt cobra was placed over the top of the capsule and transverse bleeder with accompanying veins was coagulated with the Bovie.  Once it was nicely cauterized it was divided the anterior capsule was opened there was clear joint fluid.  Capsule was opened and neck was cut with C-arm visualization 1 fingerbreadth above the lesser trochanter.  Head was removed with a corkscrew which showed typical osteoarthritic changes with erosions into the cartilage with some areas of exposed bone.  Progressive reaming was performed up for a 50 GRIPTON DePuy cup.  There is good tight fit and apex eliminator was placed in the middle and no dome screw was necessary.  C-arm was used for appropriate Abduction and Flexion.  Next hydraulic hook was applied underneath the femur carefully stay next to the bone and the hip was externally rotated under 10  degrees taken down and under hydraulic arm applied and Mueller retractor medial.  Preparation of the femur was done gradually progressing with cookie cutter rondure curettes starter broaches and then progressing up to #3 which was tight.  It was tight with AP diameter.  It appeared I might of been able to get up for unified done  significantly laterally into the trochanter more less concerned that the 4 would not allow the calcar to sit down completely onto bone with collar and wrist crack in the calcar.  Leggings were restored with a +9 neck length.  High offset neck was used.  Permanent #3 Actis stem was implanted completely flush with the calcar that did not require planing.  Good stability external rotation 90 taking the leg halfway down to the floor.  Ceramic ball + was selected been implanted identical findings of stability the previous transverse bleeders were coagulated 1 last time operative field was dry.  V-Loc closure of the fascia 2 on subcutaneous tissue skin staple closure postop dressing and transferred recovery room.

## 2022-01-30 NOTE — Transfer of Care (Signed)
Immediate Anesthesia Transfer of Care Note  Patient: Victoria Snyder  Procedure(s) Performed: RIGHT TOTAL HIP ARTHROPLASTY ANTERIOR APPROACH (Right: Hip)  Patient Location: PACU  Anesthesia Type:Spinal  Level of Consciousness: drowsy  Airway & Oxygen Therapy: Patient Spontanous Breathing  Post-op Assessment: Report given to RN  Post vital signs: Reviewed and stable  Last Vitals:  Vitals Value Taken Time  BP 109/83   Temp    Pulse 65 01/30/22 1510  Resp 17 01/30/22 1510  SpO2 97 % 01/30/22 1510  Vitals shown include unvalidated device data.  Last Pain:  Vitals:   01/30/22 1024  TempSrc:   PainSc: 6          Complications: No notable events documented.

## 2022-01-30 NOTE — Anesthesia Procedure Notes (Signed)
Spinal  Patient location during procedure: OR Start time: 01/30/2022 1:12 PM End time: 01/30/2022 1:15 PM Reason for block: surgical anesthesia Staffing Performed: anesthesiologist  Anesthesiologist: Pervis Hocking, DO Performed by: Pervis Hocking, DO Authorized by: Pervis Hocking, DO   Preanesthetic Checklist Completed: patient identified, IV checked, risks and benefits discussed, surgical consent, monitors and equipment checked, pre-op evaluation and timeout performed Spinal Block Patient position: sitting Prep: DuraPrep and site prepped and draped Patient monitoring: cardiac monitor, continuous pulse ox and blood pressure Approach: midline Location: L3-4 Injection technique: single-shot Needle Needle type: Pencan  Needle gauge: 24 G Needle length: 9 cm Assessment Sensory level: T6 Events: CSF return Additional Notes Functioning IV was confirmed and monitors were applied. Sterile prep and drape, including hand hygiene and sterile gloves were used. The patient was positioned and the spine was prepped. The skin was anesthetized with lidocaine.  Free flow of clear CSF was obtained prior to injecting local anesthetic into the CSF.  The spinal needle aspirated freely following injection.  The needle was carefully withdrawn.  The patient tolerated the procedure well.

## 2022-01-30 NOTE — Anesthesia Postprocedure Evaluation (Signed)
Anesthesia Post Note  Patient: Victoria Snyder  Procedure(s) Performed: RIGHT TOTAL HIP ARTHROPLASTY ANTERIOR APPROACH (Right: Hip)     Patient location during evaluation: PACU Anesthesia Type: MAC and Spinal Level of consciousness: awake and alert and oriented Pain management: pain level controlled Vital Signs Assessment: post-procedure vital signs reviewed and stable Respiratory status: spontaneous breathing, nonlabored ventilation and respiratory function stable Cardiovascular status: blood pressure returned to baseline and stable Postop Assessment: no headache, no backache, spinal receding and patient able to bend at knees Anesthetic complications: no   No notable events documented.  Last Vitals:  Vitals:   01/30/22 1515 01/30/22 1530  BP: 104/83 96/73  Pulse: 77   Resp: 16   Temp:    SpO2: 98%     Last Pain:  Vitals:   01/30/22 1530  TempSrc:   PainSc: 0-No pain                 Pervis Hocking

## 2022-01-31 ENCOUNTER — Other Ambulatory Visit: Payer: Self-pay

## 2022-01-31 ENCOUNTER — Encounter (HOSPITAL_COMMUNITY): Payer: Self-pay | Admitting: Orthopaedic Surgery

## 2022-01-31 DIAGNOSIS — M1611 Unilateral primary osteoarthritis, right hip: Secondary | ICD-10-CM | POA: Diagnosis not present

## 2022-01-31 LAB — BASIC METABOLIC PANEL
Anion gap: 9 (ref 5–15)
BUN: 14 mg/dL (ref 6–20)
CO2: 24 mmol/L (ref 22–32)
Calcium: 8.7 mg/dL — ABNORMAL LOW (ref 8.9–10.3)
Chloride: 101 mmol/L (ref 98–111)
Creatinine, Ser: 0.92 mg/dL (ref 0.44–1.00)
GFR, Estimated: >60 mL/min (ref >60)
Glucose, Bld: 165 mg/dL — ABNORMAL HIGH (ref 70–99)
Potassium: 3.7 mmol/L (ref 3.5–5.1)
Sodium: 134 mmol/L — ABNORMAL LOW (ref 135–145)

## 2022-01-31 LAB — CBC
HCT: 33.6 % — ABNORMAL LOW (ref 36.0–46.0)
Hemoglobin: 12.3 g/dL (ref 12.0–15.0)
MCH: 32.8 pg (ref 26.0–34.0)
MCHC: 36.6 g/dL — ABNORMAL HIGH (ref 30.0–36.0)
MCV: 89.6 fL (ref 80.0–100.0)
Platelets: 242 10*3/uL (ref 150–400)
RBC: 3.75 MIL/uL — ABNORMAL LOW (ref 3.87–5.11)
RDW: 12 % (ref 11.5–15.5)
WBC: 7.8 10*3/uL (ref 4.0–10.5)
nRBC: 0 % (ref 0.0–0.2)

## 2022-01-31 MED ORDER — OXYCODONE-ACETAMINOPHEN 5-325 MG PO TABS: 1.0000 | ORAL_TABLET | Freq: Four times a day (QID) | ORAL | 0 refills | Status: DC | PRN

## 2022-01-31 MED ORDER — OXYCODONE-ACETAMINOPHEN 5-325 MG PO TABS: 1.0000 | ORAL_TABLET | ORAL | 0 refills | Status: DC | PRN

## 2022-01-31 NOTE — Progress Notes (Signed)
Patient ID: Victoria Snyder, female   DOB: Oct 09, 1965, 56 y.o.   MRN: [id number]   Subjective: 1 Day Post-Op Procedure(s) (LRB): RIGHT TOTAL HIP ARTHROPLASTY ANTERIOR APPROACH (Right) Patient reports pain as mild and moderate.    Objective: Vital signs in last 24 hours: Temp:  [97.5 F (36.4 C)-99.7 F (37.6 C)] 99.4 F (37.4 C) (09/07 0403) Pulse Rate:  [59-106] 106 (09/07 0403) Resp:  [14-20] 18 (09/07 0403) BP: (96-149)/(67-97) 103/67 (09/07 0403) SpO2:  [96 %-100 %] 97 % (09/07 0403) Weight:  [75.8 kg] 75.8 kg (09/06 1015)  Intake/Output from previous day: 09/06 0701 - 09/07 0700 In: 1300 [I.V.:1300] Out: 2225 [Urine:1975; Blood:250] Intake/Output this shift: No intake/output data recorded.  No results for input(s): "HGB" in the last 72 hours. No results for input(s): "WBC", "RBC", "HCT", "PLT" in the last 72 hours. No results for input(s): "NA", "K", "CL", "CO2", "BUN", "CREATININE", "GLUCOSE", "CALCIUM" in the last 72 hours. No results for input(s): "LABPT", "INR" in the last 72 hours.  Neurologically intact DG Hip Port Unilat With Pelvis 1V Right  Result Date: 01/30/2022 CLINICAL DATA:  Status post right hip arthroplasty. EXAM: DG HIP (WITH OR WITHOUT PELVIS) 1V PORT RIGHT COMPARISON:  Right hip radiographs 06/26/2021 FINDINGS: Interval total right hip arthroplasty. No perihardware lucency is seen to indicate hardware failure or loosening. Expected postoperative changes including anterior lateral surgical skin staples and lateral right hip and thigh soft tissue swelling and subcutaneous air. Moderate left femoroacetabular joint space narrowing and moderate left femoral head-neck junction degenerative osteophytes. No acute fracture or dislocation. IMPRESSION: Interval total right hip arthroplasty without evidence of hardware failure. Electronically Signed   By: Yvonne Kendall M.D.   On: 01/30/2022 16:38   DG HIP UNILAT WITH PELVIS 1V RIGHT  Result Date: 01/30/2022 CLINICAL  DATA:  56 year old female under 1 right hip arthroplasty. EXAM: DG HIP (WITH OR WITHOUT PELVIS) 1V RIGHT COMPARISON:  06/26/2021 FINDINGS: Intraoperative fluoroscopic radiographic images demonstrating right total hip arthroplasty. The acetabular femoral components appear well seated without evidence of hardware loosening, fracture, or malalignment. IMPRESSION: Intraoperative right total hip arthroplasty. Electronically Signed   By: Ruthann Cancer M.D.   On: 01/30/2022 14:54   DG C-Arm 1-60 Min-No Report  Result Date: 01/30/2022 Fluoroscopy was utilized by the requesting physician.  No radiographic interpretation.   DG C-Arm 1-60 Min-No Report  Result Date: 01/30/2022 Fluoroscopy was utilized by the requesting physician.  No radiographic interpretation.    Assessment/Plan: 1 Day Post-Op Procedure(s) (LRB): RIGHT TOTAL HIP ARTHROPLASTY ANTERIOR APPROACH (Right) Up with therapy, discharge home if does well with therapy.   Victoria Snyder 01/31/2022, 8:13 AM

## 2022-01-31 NOTE — Evaluation (Signed)
Physical Therapy Evaluation Patient Details Name: Victoria Snyder MRN: [id number] DOB: 05-09-1966 Today's Date: 01/31/2022  History of Present Illness  56 y.o. female, has a history of pain and functional disability in the right hip(s) due to arthritis and patient has failed non-surgical conservative treatments for greater than 12 weeks. S/p R anterior THA 01/30/22  Clinical Impression  PTA pt living with fiance in second floor apartment with 12 steps to enter. Pt was independent in ambulation and ADLs/iADLs. Pt is currently limited by post operative pain and stiffness. Pt is currently mod I for bed mobility and min guard for transfers and ambulation. Pt educated on bed and chair level exercises. PT will return for stair training and standing exercise education with anticipation of d/c home this afternoon.      Recommendations for follow up therapy are one component of a multi-disciplinary discharge planning process, led by the attending physician.  Recommendations may be updated based on patient status, additional functional criteria and insurance authorization.  Follow Up Recommendations Home health PT      Assistance Recommended at Discharge Frequent or constant Supervision/Assistance  Patient can return home with the following  A little help with walking and/or transfers;A little help with bathing/dressing/bathroom;Assistance with cooking/housework;Assist for transportation;Help with stairs or ramp for entrance    Equipment Recommendations Rolling walker (2 wheels);BSC/3in1  Recommendations for Other Services  OT consult    Functional Status Assessment Patient has had a recent decline in their functional status and demonstrates the ability to make significant improvements in function in a reasonable and predictable amount of time.     Precautions / Restrictions Precautions Precautions: None Restrictions Weight Bearing Restrictions: Yes RLE Weight Bearing: Weight bearing as tolerated       Mobility  Bed Mobility Overal bed mobility: Modified Independent             General bed mobility comments: increased time and effort, able to come to long sitting and use UE offweighting bottom to pivot hips to EoB    Transfers Overall transfer level: Needs assistance Equipment used: Rolling walker (2 wheels) Transfers: Sit to/from Stand Sit to Stand: Min guard           General transfer comment: min guard for safety, vc for hand placement and power up    Ambulation/Gait Ambulation/Gait assistance: Min assist, Min guard Gait Distance (Feet): 80 Feet Assistive device: Rolling walker (2 wheels) Gait Pattern/deviations: Decreased step length - right, Decreased step length - left, Decreased stance time - right, Trunk flexed Gait velocity: very slowed Gait velocity interpretation: <1.31 ft/sec, indicative of household ambulator Pre-gait activities: weightshifts General Gait Details: initially min A for steadying, with step to pattern with vc for proximity to RW, upright posture and increased R knee flexion pt able to progress to min guard for safety      Balance Overall balance assessment: Needs assistance   Sitting balance-Leahy Scale: Normal     Standing balance support: Bilateral upper extremity supported, During functional activity, Reliant on assistive device for balance Standing balance-Leahy Scale: Poor Standing balance comment: requires UE support                             Pertinent Vitals/Pain Pain Assessment Pain Assessment: No/denies pain Pain Score: 10-Worst pain ever Faces Pain Scale: Hurts little more Pain Location: R hip incision site Pain Descriptors / Indicators: Operative site guarding, Grimacing, Guarding, Discomfort, Moaning, Sore Pain Intervention(s): Limited activity within  patient's tolerance, Monitored during session, Repositioned    Home Living Family/patient expects to be discharged to:: Private residence Living  Arrangements: Non-relatives/Friends Available Help at Discharge: Family;Friend(s);Available PRN/intermittently (between fiance and sister) Type of Home: Apartment Home Access: Stairs to enter Entrance Stairs-Rails: Psychiatric nurse of Steps: 12   Home Layout: One level Home Equipment: None      Prior Function Prior Level of Function : Independent/Modified Independent                        Extremity/Trunk Assessment   Upper Extremity Assessment Upper Extremity Assessment: Defer to OT evaluation    Lower Extremity Assessment Lower Extremity Assessment: RLE deficits/detail RLE Deficits / Details: R hip motion limited due to pain and guarding RLE: Unable to fully assess due to pain RLE Coordination: decreased fine motor    Cervical / Trunk Assessment Cervical / Trunk Assessment: Normal  Communication   Communication: No difficulties  Cognition Arousal/Alertness: Lethargic (increased stimulation needed for engagement) Behavior During Therapy: Flat affect Overall Cognitive Status: Within Functional Limits for tasks assessed                                          General Comments General comments (skin integrity, edema, etc.): VSS on RA    Exercises Total Joint Exercises Ankle Circles/Pumps: AROM, Both, 20 reps, Supine Quad Sets: AROM, Right, AAROM, 10 reps, Seated Short Arc Quad: AROM, Right, AAROM, 10 reps, Seated Heel Slides: AROM, Right, AAROM, 10 reps, Seated Hip ABduction/ADduction: AROM, Right, AAROM, 10 reps, Seated   Assessment/Plan    PT Assessment Patient needs continued PT services  PT Problem List Decreased range of motion;Decreased activity tolerance;Decreased balance;Decreased mobility;Decreased knowledge of use of DME;Pain       PT Treatment Interventions DME instruction;Gait training;Stair training;Functional mobility training;Therapeutic activities;Therapeutic exercise;Balance training;Cognitive  remediation;Patient/family education    PT Goals (Current goals can be found in the Care Plan section)  Acute Rehab PT Goals Patient Stated Goal: go home PT Goal Formulation: With patient Time For Goal Achievement: 02/14/22 Potential to Achieve Goals: Good    Frequency BID        AM-PAC PT "6 Clicks" Mobility  Outcome Measure Help needed turning from your back to your side while in a flat bed without using bedrails?: None Help needed moving from lying on your back to sitting on the side of a flat bed without using bedrails?: A Little Help needed moving to and from a bed to a chair (including a wheelchair)?: A Little Help needed standing up from a chair using your arms (e.g., wheelchair or bedside chair)?: A Little Help needed to walk in hospital room?: A Little Help needed climbing 3-5 steps with a railing? : A Lot 6 Click Score: 18    End of Session Equipment Utilized During Treatment: Gait belt Activity Tolerance: Patient limited by pain;Patient limited by lethargy Patient left: in chair;with call bell/phone within reach Nurse Communication: Mobility status PT Visit Diagnosis: Other abnormalities of gait and mobility (R26.89);Muscle weakness (generalized) (M62.81);Difficulty in walking, not elsewhere classified (R26.2);Pain Pain - Right/Left: Right Pain - part of body: Hip    Time: 0922-0948 PT Time Calculation (min) (ACUTE ONLY): 26 min   Charges:   PT Evaluation $PT Eval Low Complexity: 1 Low PT Treatments $Therapeutic Exercise: 8-22 mins        Bailey Mech  Fleet PT, DPT Acute Rehabilitation Services Please use secure chat or  Call Office [phone]   [place] 01/31/2022, 10:42 AM

## 2022-01-31 NOTE — Evaluation (Signed)
Occupational Therapy Evaluation Patient Details Name: Victoria Snyder MRN: [id number] DOB: [birth year] Today's Date: 01/31/2022   History of Present Illness 56 y.o. female, has a history of pain and functional disability in the right hip(s) due to arthritis and patient has failed non-surgical conservative treatments for greater than 12 weeks. S/p R anterior THA 01/30/22   Clinical Impression   PTA, pt was living with her fiance and was independent; enjoys playing with her grandchildren and gardening. Pt currently requiring Supervision-Min Guard A for Adls and functional mobility. Providing education on LB ADLs, toileting, and tub transfers. Providing handouts on options for tub transfer; pt would benefit from further acute OT to continue education on safe tub transfer. Recommend dc to home with HHOT to facilitate return to PLOF.     Recommendations for follow up therapy are one component of a multi-disciplinary discharge planning process, led by the attending physician.  Recommendations may be updated based on patient status, additional functional criteria and insurance authorization.   Follow Up Recommendations  Home health OT    Assistance Recommended at Discharge Frequent or constant Supervision/Assistance  Patient can return home with the following      Functional Status Assessment  Patient has had a recent decline in their functional status and demonstrates the ability to make significant improvements in function in a reasonable and predictable amount of time.  Equipment Recommendations  BSC/3in1    Recommendations for Other Services       Precautions / Restrictions Precautions Precautions: None Restrictions Weight Bearing Restrictions: Yes RLE Weight Bearing: Weight bearing as tolerated      Mobility Bed Mobility Overal bed mobility: Needs Assistance Bed Mobility: Sit to Supine       Sit to supine: Min guard, HOB elevated   General bed mobility comments: HOB elevated.  Cues for moving LEs first    Transfers Overall transfer level: Needs assistance Equipment used: Rolling walker (2 wheels) Transfers: Sit to/from Stand Sit to Stand: Min guard           General transfer comment: continues to require increased multimodal cuing for sequencing and keeping eyes open      Balance Overall balance assessment: Needs assistance   Sitting balance-Leahy Scale: Normal     Standing balance support: During functional activity, Reliant on assistive device for balance, No upper extremity supported, Bilateral upper extremity supported Standing balance-Leahy Scale: Fair Standing balance comment: able to stand without UE support                           ADL either performed or assessed with clinical judgement   ADL Overall ADL's : Needs assistance/impaired Eating/Feeding: Set up;Sitting   Grooming: Set up;Sitting   Upper Body Bathing: Supervision/ safety;Set up;Sitting   Lower Body Bathing: Min guard;Sit to/from stand   Upper Body Dressing : Supervision/safety;Set up;Sitting   Lower Body Dressing: Min guard;Sit to/from stand Lower Body Dressing Details (indicate cue type and reason): Educating on donning RLE first. Cues and min gaurd a for safety Toilet Transfer: Min guard;Ambulation;Rolling walker (2 wheels) (simulated to recliner)         Tub/Shower Transfer Details (indicate cue type and reason): Providing education on tub transfer with use of 3n1 and tub bnech. providing handouts. Functional mobility during ADLs: Supervision/safety;Rolling walker (2 wheels) General ADL Comments: Providing education on LB dressing, toileting with 3n1, and tub transfer     Vision         Perception  Praxis      Pertinent Vitals/Pain Pain Assessment Pain Assessment: Faces Faces Pain Scale: Hurts little more Pain Location: R hip incision site Pain Descriptors / Indicators: Operative site guarding, Grimacing, Guarding, Discomfort, Moaning,  Sore Pain Intervention(s): Monitored during session, Repositioned     Hand Dominance     Extremity/Trunk Assessment Upper Extremity Assessment Upper Extremity Assessment: Overall WFL for tasks assessed   Lower Extremity Assessment Lower Extremity Assessment: Defer to PT evaluation RLE Deficits / Details: R hip motion limited due to pain and guarding RLE: Unable to fully assess due to pain RLE Coordination: decreased fine motor   Cervical / Trunk Assessment Cervical / Trunk Assessment: Normal   Communication Communication Communication: No difficulties   Cognition Arousal/Alertness: Lethargic Behavior During Therapy: Flat affect Overall Cognitive Status: Within Functional Limits for tasks assessed                                 General Comments: Following commands and attending to tasks when active. However, when sitting and resting, pt falling asleep quickly even in conversation     General Comments  nephew? present    Exercises     Shoulder Instructions      Home Living Family/patient expects to be discharged to:: Private residence Living Arrangements: Non-relatives/Friends Available Help at Discharge: Family;Friend(s);Available PRN/intermittently (between fiance and sister) Type of Home: Apartment Home Access: Stairs to enter [employer] of Steps: 12 Entrance Stairs-Rails: Right;Left Home Layout: One level     Bathroom Shower/Tub: Teacher, early years/pre: Standard Bathroom Accessibility: Yes   Home Equipment: None          Prior Functioning/Environment Prior Level of Function : Independent/Modified Independent                        OT Problem List: Decreased strength;Decreased range of motion;Decreased activity tolerance;Impaired balance (sitting and/or standing);Decreased knowledge of use of DME or AE;Decreased knowledge of precautions;Pain      OT Treatment/Interventions: Self-care/ADL  training;Therapeutic exercise;Energy conservation;DME and/or AE instruction;Therapeutic activities;Patient/family education    OT Goals(Current goals can be found in the care plan section) Acute Rehab OT Goals Patient Stated Goal: Go home OT Goal Formulation: With patient Time For Goal Achievement: 02/14/22 Potential to Achieve Goals: Good  OT Frequency: Min 2X/week    Co-evaluation              AM-PAC OT "6 Clicks" Daily Activity     Outcome Measure Help from another person eating meals?: None Help from another person taking care of personal grooming?: A Little Help from another person toileting, which includes using toliet, bedpan, or urinal?: A Little Help from another person bathing (including washing, rinsing, drying)?: A Little Help from another person to put on and taking off regular upper body clothing?: A Little Help from another person to put on and taking off regular lower body clothing?: A Little 6 Click Score: 19   End of Session Equipment Utilized During Treatment: Rolling walker (2 wheels);Gait belt Nurse Communication: Mobility status  Activity Tolerance: Patient tolerated treatment well Patient left: in chair;with call bell/phone within reach;with family/visitor present  OT Visit Diagnosis: Unsteadiness on feet (R26.81);Other abnormalities of gait and mobility (R26.89);Muscle weakness (generalized) (M62.81);Pain Pain - Right/Left: Right Pain - part of body: Leg                Time: 5883-2549 OT Time Calculation (  min): 35 min Charges:  OT General Charges $OT Visit: 1 Visit OT Evaluation $OT Eval Low Complexity: 1 Low OT Treatments $Self Care/Home Management : 8-22 mins  Sianni Cloninger MSOT, OTR/L Acute Rehab Office: [place] 01/31/2022, 5:52 PM

## 2022-01-31 NOTE — Progress Notes (Signed)
Physical Therapy Treatment Patient Details Name: Victoria Snyder MRN: [id number] DOB: March 04, 1966 Today's Date: 01/31/2022   History of Present Illness 56 y.o. female, has a history of pain and functional disability in the right hip(s) due to arthritis and patient has failed non-surgical conservative treatments for greater than 12 weeks. S/p R anterior THA 01/30/22    PT Comments    Pt somewhat more awake after using bathroom and bladder scan. Pt has still not voided. Pt continues to require increased cuing and stimulation to keep eyes open with therapy. Pt min A for ambulation in hallway. Pt requires modA for ascent/descent of 5 steps with sideways stepping and both hands on railing. Edcuated pt and sister in need for pt to have assist. Provided with gait belt to improve assistance and CoG. D/c plans appropriate. PT will follow back tomorrow if pt is still here.   Recommendations for follow up therapy are one component of a multi-disciplinary discharge planning process, led by the attending physician.  Recommendations may be updated based on patient status, additional functional criteria and insurance authorization.  Follow Up Recommendations  Home health PT     Assistance Recommended at Discharge Frequent or constant Supervision/Assistance  Patient can return home with the following A little help with walking and/or transfers;A little help with bathing/dressing/bathroom;Assistance with cooking/housework;Assist for transportation;Help with stairs or ramp for entrance   Equipment Recommendations  Rolling walker (2 wheels);BSC/3in1    Recommendations for Other Services OT consult     Precautions / Restrictions Precautions Precautions: None Restrictions Weight Bearing Restrictions: Yes RLE Weight Bearing: Weight bearing as tolerated     Mobility  Bed Mobility Overal bed mobility: Needs Assistance Bed Mobility: Sit to Supine       Sit to supine: Min assist   General bed mobility  comments: min A for management of LE back into bed    Transfers Overall transfer level: Needs assistance Equipment used: Rolling walker (2 wheels) Transfers: Sit to/from Stand Sit to Stand: Min guard           General transfer comment: continues to require increased multimodal cuing for sequencing and keeping eyes open    Ambulation/Gait Ambulation/Gait assistance: Min assist, Min guard Gait Distance (Feet): 40 Feet Assistive device: Rolling walker (2 wheels) Gait Pattern/deviations: Decreased step length - right, Decreased step length - left, Decreased stance time - right, Trunk flexed Gait velocity: very slowed Gait velocity interpretation: <1.31 ft/sec, indicative of household ambulator Pre-gait activities: weightshifts General Gait Details: pt continues to ambulate at very slowed pace but able to progres to step through pattern, requires cues for eyes open   Stairs Stairs: Yes Stairs assistance: Mod assist Stair Management: One rail Right, Sideways, Step to pattern Number of Stairs: 5 General stair comments: pt has one rail on R, PT trained for facing railing and side stepping up stairs, vc for sequencing. educated pt and sister on need for someone to help pt up the step, pt provided with gait belt so assist can take place at CoG       Balance Overall balance assessment: Needs assistance   Sitting balance-Leahy Scale: Normal     Standing balance support: Bilateral upper extremity supported, During functional activity, Reliant on assistive device for balance Standing balance-Leahy Scale: Poor Standing balance comment: requires UE support                            Cognition Arousal/Alertness: Lethargic (increased stimulation needed  for engagement) Behavior During Therapy: Flat affect Overall Cognitive Status: Within Functional Limits for tasks assessed                                 General Comments: continues to be lethargic and  needs increased cuing to keep eyes open during session        Exercises Total Joint Exercises Ankle Circles/Pumps: AROM, Both, 20 reps, Supine Quad Sets: AROM, Right, AAROM, 10 reps, Seated Short Arc Quad: AROM, Right, AAROM, 10 reps, Seated Heel Slides: AROM, Right, AAROM, 10 reps, Seated Hip ABduction/ADduction: AROM, Right, AAROM, 10 reps, Seated    General Comments General comments (skin integrity, edema, etc.): HR with hallway ambulation in high 150s bpm, encouraged pt to increase fluid intake      Pertinent Vitals/Pain Pain Assessment Pain Assessment: Faces Pain Score: 10-Worst pain ever Faces Pain Scale: Hurts little more Pain Location: R hip incision site Pain Descriptors / Indicators: Operative site guarding, Grimacing, Guarding, Discomfort, Moaning, Sore Pain Intervention(s): Limited activity within patient's tolerance, Monitored during session, Repositioned     PT Goals (current goals can now be found in the care plan section) Acute Rehab PT Goals Patient Stated Goal: go home PT Goal Formulation: With patient Time For Goal Achievement: 02/14/22 Potential to Achieve Goals: Good Progress towards PT goals: Progressing toward goals    Frequency    BID      PT Plan Current plan remains appropriate       AM-PAC PT "6 Clicks" Mobility   Outcome Measure  Help needed turning from your back to your side while in a flat bed without using bedrails?: None Help needed moving from lying on your back to sitting on the side of a flat bed without using bedrails?: A Little Help needed moving to and from a bed to a chair (including a wheelchair)?: A Little Help needed standing up from a chair using your arms (e.g., wheelchair or bedside chair)?: A Little Help needed to walk in hospital room?: A Little Help needed climbing 3-5 steps with a railing? : A Lot 6 Click Score: 18    End of Session Equipment Utilized During Treatment: Gait belt Activity Tolerance: Patient  limited by pain;Patient limited by lethargy Patient left: in bed;with family/visitor present;with call bell/phone within reach;with bed alarm set (in bathroom with NT) Nurse Communication: Mobility status PT Visit Diagnosis: Other abnormalities of gait and mobility (R26.89);Muscle weakness (generalized) (M62.81);Difficulty in walking, not elsewhere classified (R26.2);Pain Pain - Right/Left: Right Pain - part of body: Hip     Time: 1531-1550 PT Time Calculation (min) (ACUTE ONLY): 19 min  Charges:  $Gait Training: 8-22 mins                     Arthurine Oleary B. Migdalia Dk PT, DPT Acute Rehabilitation Services Please use secure chat or  Call Office [phone]    [place] 01/31/2022, 4:34 PM

## 2022-01-31 NOTE — Progress Notes (Signed)
Physical Therapy Treatment Patient Details Name: Victoria Snyder MRN: [id number] DOB: Jun 19, 1965 Today's Date: 01/31/2022   History of Present Illness 56 y.o. female, has a history of pain and functional disability in the right hip(s) due to arthritis and patient has failed non-surgical conservative treatments for greater than 12 weeks. S/p R anterior THA 01/30/22    PT Comments    Pt sister in room, worried about level of arousal. Pt with eyes closed in chair lunch in front of her. With increased stimulation pt able to wake up and open eyes noted to still have bite of food left in her mouth. NT takes BP 121/86, HR 116bpm. Pt requires increased cuing to open eyes and follow commands for sit<>stand. Pt ambulates out into hallway with constant cuing for sequencing. HR in 140s with slowed short distance ambulation. Return to room where NT reports pt has not voided yet. Left pt on toilet with NT in room. PT will return for stair training if pt level arousal improves.     Recommendations for follow up therapy are one component of a multi-disciplinary discharge planning process, led by the attending physician.  Recommendations may be updated based on patient status, additional functional criteria and insurance authorization.  Follow Up Recommendations  Home health PT     Assistance Recommended at Discharge Frequent or constant Supervision/Assistance  Patient can return home with the following A little help with walking and/or transfers;A little help with bathing/dressing/bathroom;Assistance with cooking/housework;Assist for transportation;Help with stairs or ramp for entrance   Equipment Recommendations  Rolling walker (2 wheels);BSC/3in1    Recommendations for Other Services OT consult     Precautions / Restrictions Precautions Precautions: None Restrictions Weight Bearing Restrictions: Yes RLE Weight Bearing: Weight bearing as tolerated     Mobility  Bed Mobility Overal bed mobility:  Modified Independent             General bed mobility comments: increased time and effort, able to come to long sitting and use UE offweighting bottom to pivot hips to EoB    Transfers Overall transfer level: Needs assistance Equipment used: Rolling walker (2 wheels) Transfers: Sit to/from Stand Sit to Stand: Min guard           General transfer comment: min guard for safety, vc for hand placement and power up, increased cuing for scooting hips forward prior to power up    Ambulation/Gait Ambulation/Gait assistance: Min assist, Min guard Gait Distance (Feet): 20 Feet Assistive device: Rolling walker (2 wheels) Gait Pattern/deviations: Decreased step length - right, Decreased step length - left, Decreased stance time - right, Trunk flexed Gait velocity: very slowed Gait velocity interpretation: <1.31 ft/sec, indicative of household ambulator Pre-gait activities: weightshifts General Gait Details: pt with decreased ability to flex R knee for foot clearance returned to room due to inability to progress       Balance Overall balance assessment: Needs assistance   Sitting balance-Leahy Scale: Normal     Standing balance support: Bilateral upper extremity supported, During functional activity, Reliant on assistive device for balance Standing balance-Leahy Scale: Poor Standing balance comment: requires UE support                            Cognition Arousal/Alertness: Lethargic (increased stimulation needed for engagement) Behavior During Therapy: Flat affect Overall Cognitive Status: Within Functional Limits for tasks assessed  General Comments: continues to be lethargic and needs increased cuing to keep eyes open during session        Exercises Total Joint Exercises Ankle Circles/Pumps: AROM, Both, 20 reps, Supine Quad Sets: AROM, Right, AAROM, 10 reps, Seated Short Arc Quad: AROM, Right, AAROM, 10 reps,  Seated Heel Slides: AROM, Right, AAROM, 10 reps, Seated Hip ABduction/ADduction: AROM, Right, AAROM, 10 reps, Seated    General Comments General comments (skin integrity, edema, etc.): HR with short distance ambulatio 142bpm, pt noted to not have voided since Foley removed      Pertinent Vitals/Pain Pain Assessment Pain Assessment: Faces Pain Score: 10-Worst pain ever Faces Pain Scale: Hurts little more Pain Location: R hip incision site Pain Descriptors / Indicators: Operative site guarding, Grimacing, Guarding, Discomfort, Moaning, Sore Pain Intervention(s): Limited activity within patient's tolerance, Monitored during session, Repositioned     PT Goals (current goals can now be found in the care plan section) Acute Rehab PT Goals Patient Stated Goal: go home PT Goal Formulation: With patient Time For Goal Achievement: 02/14/22 Potential to Achieve Goals: Good Progress towards PT goals: Not progressing toward goals - comment    Frequency    BID      PT Plan Current plan remains appropriate       AM-PAC PT "6 Clicks" Mobility   Outcome Measure  Help needed turning from your back to your side while in a flat bed without using bedrails?: None Help needed moving from lying on your back to sitting on the side of a flat bed without using bedrails?: A Little Help needed moving to and from a bed to a chair (including a wheelchair)?: A Little Help needed standing up from a chair using your arms (e.g., wheelchair or bedside chair)?: A Little Help needed to walk in hospital room?: A Little Help needed climbing 3-5 steps with a railing? : A Lot 6 Click Score: 18    End of Session Equipment Utilized During Treatment: Gait belt Activity Tolerance: Patient limited by pain;Patient limited by lethargy Patient left: Other (comment) (in bathroom with NT) Nurse Communication: Mobility status PT Visit Diagnosis: Other abnormalities of gait and mobility (R26.89);Muscle weakness  (generalized) (M62.81);Difficulty in walking, not elsewhere classified (R26.2);Pain Pain - Right/Left: Right Pain - part of body: Hip     Time: 1449-1511 PT Time Calculation (min) (ACUTE ONLY): 22 min  Charges:  $Gait Training: 8-22 mins                     Lamae Fosco B. Migdalia Dk PT, DPT Acute Rehabilitation Services Please use secure chat or  Call Office [phone]     [place] 01/31/2022, 4:22 PM

## 2022-01-31 NOTE — TOC Transition Note (Signed)
Transition of Care [facility]) - CM/SW Discharge Note   Patient Details  Name: Victoria Snyder MRN: [id number] Date of Birth: April 05, 1966  Transition of Care [facility]) CM/SW Contact:  Pollie Friar, RN Phone Number: 01/31/2022, 10:40 AM   Clinical Narrative:    Pt is s/p: RIGHT TOTAL HIP ARTHROPLASTY ANTERIOR APPROACH (Right: Hip). Recommendations are for [facility] services. CM has arranged [place] with [facility]. Information on the AVS.  Any needed DME will be obtained by bedside RN.  Pt has transportation home.         Patient Goals and CMS Choice        Discharge Placement                       Discharge Plan and Services                                     Social Determinants of Health (SDOH) Interventions     Readmission Risk Interventions     No data to display

## 2022-01-31 NOTE — Progress Notes (Signed)
Discharged instructions/education/Rx/AVS given to patient with husband at bedside and they both verbalized understanding. No drainage, no redness and mild swelling noted on incision site. Pain is mild to moderate and controlled by PRN meds. Patient ambulating well with assist and rolling walker. Patient finally voided @ 1930 and empty her bladder well with PVR of 0. Discharged via wheelchair on private vehicle.

## 2022-02-04 NOTE — Discharge Summary (Signed)
Patient ID: Victoria Snyder MRN: [id number] DOB/AGE: 1966-03-22 56 y.o.  Admit date: 01/30/2022 Discharge date: 01/31/2022  Admission Diagnoses:  Principal Problem:   Status post total hip replacement, right   Discharge Diagnoses:  Principal Problem:   Status post total hip replacement, right  status post Procedure(s): RIGHT TOTAL HIP ARTHROPLASTY ANTERIOR APPROACH  Past Medical History:  Diagnosis Date   Anxiety    Arthritis    Bipolar disorder ([place])    Blood transfusion without reported diagnosis    Hypertension    Hypokalemia    Low back pain with radiation    Vertigo     Surgeries: Procedure(s): RIGHT TOTAL HIP ARTHROPLASTY ANTERIOR APPROACH on 01/30/2022   Consultants:   Discharged Condition: Improved  Hospital Course: Victoria Snyder is an 56 y.o. female who was admitted 01/30/2022 for operative treatment of right hip djd.  Patient failed conservative treatments (please see the history and physical for the specifics) and had severe unremitting pain that affects sleep, daily activities and work/hobbies. After pre-op clearance, the patient was taken to the operating room on 01/30/2022 and underwent  Procedure(s): RIGHT TOTAL HIP ARTHROPLASTY ANTERIOR APPROACH.    Patient was given perioperative antibiotics:  Anti-infectives (From admission, onward)    Start     Dose/Rate Route Frequency Ordered Stop   01/30/22 1015  ceFAZolin (ANCEF) IVPB 2g/100 mL premix        2 g 200 mL/hr over 30 Minutes Intravenous On call to O.R. 01/30/22 1011 01/30/22 1347        Patient was given sequential compression devices and early ambulation to prevent DVT.   Patient benefited maximally from hospital stay and there were no complications. At the time of discharge, the patient was urinating/moving their bowels without difficulty, tolerating a regular diet, pain is controlled with oral pain medications and they have been cleared by PT/OT.   Recent vital signs: No data found.   Recent  laboratory studies: No results for input(s): "WBC", "HGB", "HCT", "PLT", "NA", "K", "CL", "CO2", "BUN", "CREATININE", "GLUCOSE", "INR", "CALCIUM" in the last 72 hours.  Invalid input(s): "PT", "2"   Discharge Medications:   Allergies as of 01/31/2022   No Known Allergies      Medication List     STOP taking these medications    methylPREDNISolone 4 MG Tbpk tablet Commonly known as: MEDROL DOSEPAK   Voltaren 1 % Gel Generic drug: diclofenac Sodium       TAKE these medications    amLODipine 5 MG tablet Commonly known as: NORVASC Take 1 tablet (5 mg total) by mouth daily.   aspirin EC 325 MG tablet Take 1 tablet (325 mg total) by mouth daily. MUST TAKE AT LEAST 4 WEEKS POSTOP FOR DVT PROPHYLAXIS   busPIRone 15 MG tablet Commonly known as: BUSPAR TAKE 1 TABLET (15 MG TOTAL) BY MOUTH 3 (THREE) TIMES DAILY.   DULoxetine HCl 40 MG Cpep Take 1 capsule (40 mg) by mouth 2 (two) times daily at 10 AM and 5 PM.   gabapentin 400 MG capsule Commonly known as: NEURONTIN Take 1 capsule (400 mg total) by mouth 3 (three) times daily. To help with back pain with radiation   hydrOXYzine 10 MG tablet Commonly known as: ATARAX Take 1 tablet (10 mg total) by mouth 3 (three) times daily as needed for anxiety.   meclizine 25 MG tablet Commonly known as: ANTIVERT Take 1 tablet (25 mg total) by mouth 3 (three) times daily as needed for dizziness.  methocarbamol 500 MG tablet Commonly known as: ROBAXIN Take 1 tablet (500 mg total) by mouth every 6 (six) hours as needed for muscle spasms.   multivitamin with minerals tablet Take 1 tablet by mouth daily.   oxyCODONE-acetaminophen 5-325 MG tablet Commonly known as: Percocet Take 1-2 tablets by mouth every 6 (six) hours as needed for severe pain.   QUEtiapine 300 MG tablet Commonly known as: SEROQUEL Take 1 tablet (300 mg total) by mouth at bedtime.   traZODone 100 MG tablet Commonly known as: DESYREL TAKE 1 TABLET (100 MG TOTAL)  BY MOUTH AT BEDTIME AS NEEDED FOR SLEEP.   vitamin C 1000 MG tablet Take 1,000 mg by mouth daily.   zinc gluconate 50 MG tablet Take 50 mg by mouth daily.        Diagnostic Studies: DG Hip Port Unilat With Pelvis 1V Right  Result Date: 01/30/2022 CLINICAL DATA:  Status post right hip arthroplasty. EXAM: DG HIP (WITH OR WITHOUT PELVIS) 1V PORT RIGHT COMPARISON:  Right hip radiographs 06/26/2021 FINDINGS: Interval total right hip arthroplasty. No perihardware lucency is seen to indicate hardware failure or loosening. Expected postoperative changes including anterior lateral surgical skin staples and lateral right hip and thigh soft tissue swelling and subcutaneous air. Moderate left femoroacetabular joint space narrowing and moderate left femoral head-neck junction degenerative osteophytes. No acute fracture or dislocation. IMPRESSION: Interval total right hip arthroplasty without evidence of hardware failure. Electronically Signed   By: Yvonne Kendall M.D.   On: 01/30/2022 16:38   DG HIP UNILAT WITH PELVIS 1V RIGHT  Result Date: 01/30/2022 CLINICAL DATA:  56 year old female under 1 right hip arthroplasty. EXAM: DG HIP (WITH OR WITHOUT PELVIS) 1V RIGHT COMPARISON:  06/26/2021 FINDINGS: Intraoperative fluoroscopic radiographic images demonstrating right total hip arthroplasty. The acetabular femoral components appear well seated without evidence of hardware loosening, fracture, or malalignment. IMPRESSION: Intraoperative right total hip arthroplasty. Electronically Signed   By: Ruthann Cancer M.D.   On: 01/30/2022 14:54   DG C-Arm 1-60 Min-No Report  Result Date: 01/30/2022 Fluoroscopy was utilized by the requesting physician.  No radiographic interpretation.   DG C-Arm 1-60 Min-No Report  Result Date: 01/30/2022 Fluoroscopy was utilized by the requesting physician.  No radiographic interpretation.       Follow-up Information     [name], MD. Schedule an appointment as soon as possible  for a visit.   Specialty: Orthopedic Surgery Why: need return office visit with dr yates one week postop Contact information: [place] [address] [phone]         Care, [facility] Follow up.   Specialty: [place] Why: The home health agency will contact you for the first home visit Contact information: [address] [place] [address] [phone]                 Discharge Plan:  discharge to home  Disposition:     Signed: Benjiman Core  02/04/2022, 10:45 AM

## 2022-02-05 ENCOUNTER — Telehealth: Payer: Self-pay | Admitting: Radiology

## 2022-02-05 NOTE — Telephone Encounter (Signed)
noted 

## 2022-02-05 NOTE — Telephone Encounter (Signed)
Received call from [place], patient's friend. Patient has been unable to have a bowel movement since total hip arthroplasty on 01/30/2022.  She started drinking prune juice and taking Miralax yesterday.  This morning she has a temperature of 100 degrees.  Braulio Conte has given her '500mg'$  tylenol for that.   Patient has post op appointment tomorrow. I advised to continue with prune juice and Miralax and to be sure that patient is taking in enough water. Is there anything else that you would recommend prior to her appointment tomorrow?  There is no drainage or redness around bandage.  CB for Braulio Conte [phone]

## 2022-02-06 ENCOUNTER — Ambulatory Visit (INDEPENDENT_AMBULATORY_CARE_PROVIDER_SITE_OTHER): Payer: Commercial Managed Care - HMO | Admitting: Orthopaedic Surgery

## 2022-02-06 ENCOUNTER — Encounter: Payer: Self-pay | Admitting: Orthopaedic Surgery

## 2022-02-06 ENCOUNTER — Ambulatory Visit: Payer: Self-pay

## 2022-02-06 ENCOUNTER — Telehealth: Payer: Self-pay | Admitting: Orthopaedic Surgery

## 2022-02-06 VITALS — BP 116/79 | HR 103 | Ht 69.25 in | Wt 167.0 lb

## 2022-02-06 DIAGNOSIS — Z96641 Presence of right artificial hip joint: Secondary | ICD-10-CM

## 2022-02-06 NOTE — Telephone Encounter (Signed)
I called and made patient appointment. She needs to come in first thing in the morning because her fiance needs to be at work by [place]. Worked in to schedule at 0815 to be the first patient.

## 2022-02-06 NOTE — Telephone Encounter (Signed)
Patient was told to come back in 1 week please advise

## 2022-02-06 NOTE — Progress Notes (Signed)
   Post-Op Visit Note   Patient: Victoria Snyder           Date of Birth: 1965/11/24           MRN: [id number] Visit Date: 02/06/2022 PCP: Tawnya Crook, MD   Assessment & Plan: Postop total of arthroplasty right hip.  She had trouble with constipation finally had a bowel movement early this morning.  She is using a walker ambulation is better she stopped taking pain medication she could use Tylenol or Aleve as needed.  Return in 1 week for staple removal Steri-Strip application.  New dressing applied.  Chief Complaint:  Chief Complaint  Patient presents with   Right Hip - Routine Post Op    01/30/2022 Right THA   Visit Diagnoses:  1. S/P total right hip arthroplasty   2. Status post total hip replacement, right     Plan: ROV one week   Follow-Up Instructions: No follow-ups on file.   Orders:  Orders Placed This Encounter  Procedures   XR HIP UNILAT W OR W/O PELVIS 2-3 VIEWS RIGHT   No orders of the defined types were placed in this encounter.   Imaging: No results found.  PMFS History: Patient Active Problem List   Diagnosis Date Noted   Status post total hip replacement, right 01/30/2022   Cervicalgia 07/13/2021   Trochanteric bursitis, right hip 06/26/2021   DDD (degenerative disc disease), lumbar 12/17/2018   Spinal stenosis of lumbar region 12/17/2018   Hypertension 03/31/2018   Low back pain with radiation 03/31/2018   History of vertigo 03/31/2018   Anxiety and depression 03/31/2018   Bipolar disorder ([place]) 03/31/2018   Past Medical History:  Diagnosis Date   Anxiety    Arthritis    Bipolar disorder ([place])    Blood transfusion without reported diagnosis    Hypertension    Hypokalemia    Low back pain with radiation    Vertigo     Family History  Problem Relation Age of Onset   Heart disease Mother        heart attack   Stroke Father    Breast cancer Neg Hx     Past Surgical History:  Procedure Laterality Date   ABDOMINAL HYSTERECTOMY      partial for fibroids   EXPLORATORY LAPAROTOMY  2011   mass in abdomen removed-uterine leiomyomata   TOTAL HIP ARTHROPLASTY Right 01/30/2022   Procedure: RIGHT TOTAL HIP ARTHROPLASTY ANTERIOR APPROACH;  Surgeon: [name], MD;  Location: [facility];  Service: Orthopedics;  Laterality: Right;   Social History   Occupational History   Occupation: Unemployed  Tobacco Use   Smoking status: Former    Types: Cigarettes    Quit date: 2001    Years since quitting: 22.7   Smokeless tobacco: Never  Vaping Use   Vaping Use: Never used  Substance and Sexual Activity   Alcohol use: Yes    Comment: Drinks occasionally   Drug use: No   Sexual activity: Yes    Birth control/protection: Surgical

## 2022-02-13 ENCOUNTER — Encounter: Payer: Self-pay | Admitting: Orthopaedic Surgery

## 2022-02-13 ENCOUNTER — Ambulatory Visit (INDEPENDENT_AMBULATORY_CARE_PROVIDER_SITE_OTHER): Payer: Commercial Managed Care - HMO | Admitting: Orthopaedic Surgery

## 2022-02-13 VITALS — BP 107/72 | HR 97 | Ht 69.25 in | Wt 167.0 lb

## 2022-02-13 DIAGNOSIS — Z96641 Presence of right artificial hip joint: Secondary | ICD-10-CM

## 2022-02-13 NOTE — Progress Notes (Signed)
   Post-Op Visit Note   Patient: Victoria Snyder           Date of Birth: July 31, 1965           MRN: [id number] Visit Date: 02/13/2022 PCP: Tawnya Crook, MD   Assessment & Plan: Post right total of arthroplasty.  Staples removed Steri-Strips applied incision looks good.  She is walking just a few steps in the house without the walker.  Continue increasing her ambulation distance.  Recheck 4 weeks.  Opposite hip has osteoarthritis but has not significantly flared since her right hip surgery.  Leg lengths are equal.   Chief Complaint:  Chief Complaint  Patient presents with   Right Hip - Routine Post Op, Follow-up    01/30/2022 Right THA   Visit Diagnoses:  1. Status post total hip replacement, right     Plan: Return 4 weeks no x-ray needed on return.  Follow-Up Instructions: No follow-ups on file.   Orders:  No orders of the defined types were placed in this encounter.  No orders of the defined types were placed in this encounter.   Imaging: No results found.  PMFS History: Patient Active Problem List   Diagnosis Date Noted   Status post total hip replacement, right 01/30/2022   Cervicalgia 07/13/2021   Trochanteric bursitis, right hip 06/26/2021   DDD (degenerative disc disease), lumbar 12/17/2018   Spinal stenosis of lumbar region 12/17/2018   Hypertension 03/31/2018   Low back pain with radiation 03/31/2018   History of vertigo 03/31/2018   Anxiety and depression 03/31/2018   Bipolar disorder ([place]) 03/31/2018   Past Medical History:  Diagnosis Date   Anxiety    Arthritis    Bipolar disorder ([place])    Blood transfusion without reported diagnosis    Hypertension    Hypokalemia    Low back pain with radiation    Vertigo     Family History  Problem Relation Age of Onset   Heart disease Mother        heart attack   Stroke Father    Breast cancer Neg Hx     Past Surgical History:  Procedure Laterality Date   ABDOMINAL HYSTERECTOMY     partial for  fibroids   EXPLORATORY LAPAROTOMY  2011   mass in abdomen removed-uterine leiomyomata   TOTAL HIP ARTHROPLASTY Right 01/30/2022   Procedure: RIGHT TOTAL HIP ARTHROPLASTY ANTERIOR APPROACH;  Surgeon: [name], MD;  Location: [facility];  Service: Orthopedics;  Laterality: Right;   Social History   Occupational History   Occupation: Unemployed  Tobacco Use   Smoking status: Former    Types: Cigarettes    Quit date: 2001    Years since quitting: 22.7   Smokeless tobacco: Never  Vaping Use   Vaping Use: Never used  Substance and Sexual Activity   Alcohol use: Yes    Comment: Drinks occasionally   Drug use: No   Sexual activity: Yes    Birth control/protection: Surgical

## 2022-02-14 ENCOUNTER — Telehealth: Payer: Self-pay | Admitting: Orthopaedic Surgery

## 2022-02-14 NOTE — Telephone Encounter (Signed)
Shirlean Mylar called from [facility] and states that pt was unable to attend therapy today bc she is not feeling well. She will start back next week   [id number]

## 2022-02-14 NOTE — Telephone Encounter (Signed)
FYI

## 2022-02-15 ENCOUNTER — Ambulatory Visit (INDEPENDENT_AMBULATORY_CARE_PROVIDER_SITE_OTHER): Payer: No Payment, Other | Admitting: Clinical

## 2022-02-15 DIAGNOSIS — F314 Bipolar disorder, current episode depressed, severe, without psychotic features: Secondary | ICD-10-CM | POA: Diagnosis not present

## 2022-02-15 NOTE — Plan of Care (Signed)
  Problem: Depression CCP Problem  1  Goal: LTG: Trayonna WILL SCORE LESS THAN 10 ON THE PATIENT HEALTH QUESTIONNAIRE (PHQ-9) Outcome: Progressing Goal: STG: Adjoa WILL IDENTIFY 3 COGNITIVE PATTERNS AND BELIEFS THAT SUPPORT DEPRESSION Outcome: Progressing   Problem: Anxiety Disorder CCP Problem  1  Goal: LTG: Patient will score less than 5 on the Generalized Anxiety Disorder 7 Scale (GAD-7) Outcome: Progressing Goal: STG: Patient will practice problem solving skills 3 times per week for the next 4 weeks Outcome: Progressing
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
## 2022-02-18 ENCOUNTER — Telehealth (INDEPENDENT_AMBULATORY_CARE_PROVIDER_SITE_OTHER): Payer: No Payment, Other | Admitting: Psychiatry

## 2022-02-18 ENCOUNTER — Other Ambulatory Visit: Payer: Self-pay

## 2022-02-18 ENCOUNTER — Encounter: Payer: Self-pay | Admitting: *Deleted

## 2022-02-18 ENCOUNTER — Encounter (HOSPITAL_COMMUNITY): Payer: Self-pay | Admitting: Psychiatry

## 2022-02-18 DIAGNOSIS — F314 Bipolar disorder, current episode depressed, severe, without psychotic features: Secondary | ICD-10-CM

## 2022-02-18 DIAGNOSIS — F419 Anxiety disorder, unspecified: Secondary | ICD-10-CM

## 2022-02-18 DIAGNOSIS — F418 Other specified anxiety disorders: Secondary | ICD-10-CM

## 2022-02-18 MED ORDER — HYDROXYZINE HCL 10 MG PO TABS
10.0000 mg | ORAL_TABLET | Freq: Three times a day (TID) | ORAL | 3 refills | Status: DC | PRN
  Filled 2022-02-18 – 2022-03-19 (×2): qty 90, 30d supply, fill #0

## 2022-02-18 MED ORDER — GABAPENTIN 400 MG PO CAPS
400.0000 mg | ORAL_CAPSULE | Freq: Three times a day (TID) | ORAL | 3 refills | Status: DC
  Filled 2022-02-18 – 2022-03-19 (×2): qty 90, 30d supply, fill #0
  Filled 2022-09-09 – 2022-10-03 (×2): qty 90, 30d supply, fill #1
  Filled 2022-10-28 – 2022-11-04 (×3): qty 90, 30d supply, fill #2

## 2022-02-18 MED ORDER — DULOXETINE HCL 40 MG PO CPEP
40.0000 mg | ORAL_CAPSULE | Freq: Two times a day (BID) | ORAL | 3 refills | Status: DC
  Filled 2022-02-18 – 2022-03-19 (×2): qty 60, 30d supply, fill #0

## 2022-02-18 MED ORDER — BUSPIRONE HCL 15 MG PO TABS
15.0000 mg | ORAL_TABLET | Freq: Three times a day (TID) | ORAL | 3 refills | Status: DC
  Filled 2022-02-18 – 2022-03-19 (×2): qty 90, 30d supply, fill #0

## 2022-02-18 MED ORDER — QUETIAPINE FUMARATE 50 MG PO TABS
50.0000 mg | ORAL_TABLET | Freq: Every day | ORAL | 3 refills | Status: DC
  Filled 2022-02-18 – 2022-03-19 (×2): qty 30, 30d supply, fill #0

## 2022-02-18 MED ORDER — QUETIAPINE FUMARATE 300 MG PO TABS
300.0000 mg | ORAL_TABLET | Freq: Every day | ORAL | 3 refills | Status: DC
  Filled 2022-02-18 – 2022-03-19 (×2): qty 30, 30d supply, fill #0

## 2022-02-18 MED ORDER — TRAZODONE HCL 100 MG PO TABS
100.0000 mg | ORAL_TABLET | Freq: Every evening | ORAL | 3 refills | Status: DC | PRN
  Filled 2022-02-18 – 2022-03-19 (×2): qty 30, 30d supply, fill #0

## 2022-02-18 NOTE — Progress Notes (Signed)
BH MD/PA/NP OP Progress Note Virtual Visit via Video Note  I connected with Victoria Snyder on 02/18/22 at  2:30 PM EDT by a video enabled telemedicine application and verified that I am speaking with the correct person using two identifiers.  Location: Patient: Home Provider: Clinic   I discussed the limitations of evaluation and management by telemedicine and the availability of in person appointments. The patient expressed understanding and agreed to proceed.  I provided 30 minutes of non-face-to-face time during this encounter.         02/18/2022 2:47 PM Victoria Snyder  MRN:  [id number]  Chief Complaint:  "I have been been depressed"   HPI: 56 year old female seen today for follow up psychiatric evaluation. She has a  psychiatric history of Bipolar 2, anxiety, and depression. She is currently being managed on Seroquel 350 mg nighty, Cymbalta 40 twice daily, Buspar 15 mg three times daily, and Hydroxyzine 10 mg three times daily as needed.  Patient was also prescribed trazodone 100 mg however notes that she has not had any in a while.  She notes that her medications are somewhat effective in managing her psychiatric conditions.   Today she was well-groomed, pleasant, cooperative, engaged in conversation, and maintained eye contact.  She informed Probation officer that she has been depressed.  She notes her depression worsened after she had hip surgery a little earlier this month (January 30, 2022).  Patient notes that at times she does not want to get out of bed.  Provider asked patient if she has been taking her medications as prescribed.  She informed Probation officer that she has been without Cymbalta and trazodone.  Today provider conducted a GAD-7 and patient scored a 14, at her last visit she scored an 18.  Provider also conducted PHQ-9 and patient scored a 17, at her last visit she scored an 18.  She endorses poor sleep noting that she sleeps 3 to 4 hours nightly.  Patient also reports that her  appetite has been poor and notes that she has lost a few pounds.  Today she endorses passive SI however denies wanting to harm self.  She denies SI/HI/VAH, mania, paranoia.  Patient informed Probation officer that she is now living with her female friend.  She notes that things are going well in their relationship.  She informed Probation officer that he has been supportive in helping her since her surgery.  Today patient is agreeable to restarting Cymbalta and trazodone.  She will continue other medications as prescribed.  No other concerns noted at this time.    Visit Diagnosis:    ICD-10-CM   1. Anxiety and depression  F41.9 busPIRone (BUSPAR) 15 MG tablet   F32.A DULoxetine HCl 40 MG CPEP    gabapentin (NEURONTIN) 400 MG capsule    hydrOXYzine (ATARAX) 10 MG tablet    QUEtiapine (SEROQUEL) 300 MG tablet    traZODone (DESYREL) 100 MG tablet    QUEtiapine (SEROQUEL) 50 MG tablet    2. Bipolar disorder, current episode depressed, severe, without psychotic features ([place])  F31.4 gabapentin (NEURONTIN) 400 MG capsule    QUEtiapine (SEROQUEL) 300 MG tablet    traZODone (DESYREL) 100 MG tablet    QUEtiapine (SEROQUEL) 50 MG tablet      Past Psychiatric History: anxiety, depression, insomnia, and bipolar 2 disorder. Past Medical History:  Past Medical History:  Diagnosis Date   Anxiety    Arthritis    Bipolar disorder ([place])    Blood transfusion without reported diagnosis  Hypertension    Hypokalemia    Low back pain with radiation    Vertigo     Past Surgical History:  Procedure Laterality Date   ABDOMINAL HYSTERECTOMY     partial for fibroids   EXPLORATORY LAPAROTOMY  2011   mass in abdomen removed-uterine leiomyomata   TOTAL HIP ARTHROPLASTY Right 01/30/2022   Procedure: RIGHT TOTAL HIP ARTHROPLASTY ANTERIOR APPROACH;  Surgeon: [name], MD;  Location: [place];  Service: Orthopedics;  Laterality: Right;    Family Psychiatric History: Notes that her half brother had a mental illness however  she is unaware of what it was.     Family History:  Family History  Problem Relation Age of Onset   Heart disease Mother        heart attack   Stroke Father    Breast cancer Neg Hx     Social History:  Social History   Socioeconomic History   Marital status: Married    Spouse name: Not on file   Number of children: 1   Years of education: Not on file   Highest education level: 12th grade  Occupational History   Occupation: Unemployed  Tobacco Use   Smoking status: Former    Types: Cigarettes    Quit date: 2001    Years since quitting: 22.7   Smokeless tobacco: Never  Vaping Use   Vaping Use: Never used  Substance and Sexual Activity   Alcohol use: Yes    Comment: Drinks occasionally   Drug use: No   Sexual activity: Yes    Birth control/protection: Surgical  Other Topics Concern   Not on file  Social History Narrative   Separated.  1 child   Not working.    Social Determinants of Health   Financial Resource Strain: Not on file  Food Insecurity: No Food Insecurity (05/03/2021)   Hunger Vital Sign    Worried About Running Out of Food in the Last Year: Never true    Ran Out of Food in the Last Year: Never true  Transportation Needs: No Transportation Needs (05/03/2021)   PRAPARE - Hydrologist (Medical): No    Lack of Transportation (Non-Medical): No  Physical Activity: Not on file  Stress: Not on file  Social Connections: Not on file    Allergies: No Known Allergies  Metabolic Disorder Labs: Lab Results  Component Value Date   HGBA1C 5.2 06/06/2021   No results found for: "PROLACTIN" Lab Results  Component Value Date   CHOL 193 06/04/2021   TRIG 118.0 06/04/2021   HDL 64.60 06/04/2021   CHOLHDL 3 06/04/2021   VLDL 23.6 06/04/2021   LDLCALC 105 (H) 06/04/2021   LDLCALC 94 08/18/2020   Lab Results  Component Value Date   TSH 1.04 06/04/2021   TSH 1.840 10/22/2017    Therapeutic Level Labs: No results found for:  "LITHIUM" No results found for: "VALPROATE" No results found for: "CBMZ"  Current Medications: Current Outpatient Medications  Medication Sig Dispense Refill   QUEtiapine (SEROQUEL) 50 MG tablet Take 1 tablet (50 mg total) by mouth at bedtime. 30 tablet 3   amLODipine (NORVASC) 5 MG tablet Take 1 tablet (5 mg total) by mouth daily. 90 tablet 3   Ascorbic Acid (VITAMIN C) 1000 MG tablet Take 1,000 mg by mouth daily.     aspirin EC 325 MG tablet Take 1 tablet (325 mg total) by mouth daily. MUST TAKE AT LEAST 4 WEEKS POSTOP FOR DVT  PROPHYLAXIS 30 tablet 0   busPIRone (BUSPAR) 15 MG tablet Take 1 tablet (15 mg total) by mouth 3 (three) times daily. 90 tablet 3   DULoxetine HCl 40 MG CPEP Take 1 capsule (40 mg) by mouth 2 (two) times daily at 10 AM and 5 PM. 60 capsule 3   gabapentin (NEURONTIN) 400 MG capsule Take 1 capsule (400 mg total) by mouth 3 (three) times daily. To help with back pain with radiation 90 capsule 3   hydrOXYzine (ATARAX) 10 MG tablet Take 1 tablet (10 mg total) by mouth 3 (three) times daily as needed for anxiety. 90 tablet 3   meclizine (ANTIVERT) 25 MG tablet Take 1 tablet (25 mg total) by mouth 3 (three) times daily as needed for dizziness. 30 tablet 3   methocarbamol (ROBAXIN) 500 MG tablet Take 1 tablet (500 mg total) by mouth every 6 (six) hours as needed for muscle spasms. 60 tablet 0   Multiple Vitamins-Minerals (MULTIVITAMIN WITH MINERALS) tablet Take 1 tablet by mouth daily.     oxyCODONE-acetaminophen (PERCOCET) 5-325 MG tablet Take 1-2 tablets by mouth every 6 (six) hours as needed for severe pain. (Patient not taking: Reported on 02/06/2022) 40 tablet 0   QUEtiapine (SEROQUEL) 300 MG tablet Take 1 tablet (300 mg total) by mouth at bedtime. 30 tablet 3   traZODone (DESYREL) 100 MG tablet Take 1 tablet (100 mg total) by mouth at bedtime as needed for sleep. 30 tablet 3   zinc gluconate 50 MG tablet Take 50 mg by mouth daily.     No current facility-administered  medications for this visit.     Musculoskeletal: Strength & Muscle Tone: within normal limits and telehealth visit Gait & Station: normal, telehealth visit Patient leans: N/A  Psychiatric Specialty Exam: Review of Systems  There were no vitals taken for this visit.There is no height or weight on file to calculate BMI.  General Appearance: Well Groomed  Eye Contact:  Good  Speech:  Clear and Coherent and Normal Rate  Volume:  Normal  Mood:  Anxious and Depressed  Affect:  Congruent  Thought Process:  Coherent, Goal Directed and Linear  Orientation:  Full (Time, Place, and Person)  Thought Content: WDL and Logical   Suicidal Thoughts:  Yes.  without intent/plan  Homicidal Thoughts:  No  Memory:  Immediate;   Good Recent;   Good Remote;   Good  Judgement:  Good  Insight:  Good  Psychomotor Activity:  Normal  Concentration:  Concentration: Good and Attention Span: Good  Recall:  Good  Fund of Knowledge: Good  Language: Good  Akathisia:  No  Handed:  Right  AIMS (if indicated): Not done  Assets:  Communication Skills Desire for Improvement Financial Resources/Insurance Housing Social Support  ADL's:  Intact  Cognition: WNL  Sleep:  Poor   Screenings: GAD-7    Flowsheet Row Video Visit from 02/18/2022 in [facility] Counselor from 02/15/2022 in [facility] Video Visit from 11/15/2021 in [facility] Video Visit from 06/01/2021 in [facility] Office Visit from 05/17/2021 in [place]  Total GAD-7 Score '14 11 18 20 21      '$ PHQ2-9    Flowsheet Row Video Visit from 02/18/2022 in [facility] Counselor from 02/15/2022 in [facility] Video Visit from 11/15/2021 in [facility] Office Visit from 10/04/2021 in [place]  Office  Visit from 07/20/2021 in [facility]  PHQ-2 Total Score '4 2 6 '$ 0 5  PHQ-9 Total Score '17 10 18 '$ -- 19      Flowsheet Row Video Visit from 02/18/2022 in [place] [facility] from 01/24/2022 in [facility] PREADMISSION TESTING Video Visit from 11/15/2021 in [place] Error: Q7 should not be populated when Q6 is No No Risk Error: Q7 should not be populated when Q6 is No        Assessment and Plan: Patient endorses symptoms of anxiety, insomnia, and depression.  She informed Probation officer that she has been without her trazodone and Cymbalta.  Today she is agreeable to restarting these medications to help manage anxiety, depression, and sleep.  She will continue her other medication as prescribed.   1. Anxiety and depression  Continue- busPIRone (BUSPAR) 15 MG tablet; TAKE 1 TABLET (15 MG TOTAL) BY MOUTH 3 (THREE) TIMES DAILY.  Dispense: 90 tablet; Refill: 3 Restart- DULoxetine HCl 40 MG CPEP; Take 40 mg by mouth 2 (two) times daily at 10 AM and 5 PM.  Dispense: 60 capsule; Refill: 3 Continue gabapentin (NEURONTIN) 400 MG capsule; Take 1 capsule (400 mg total) by mouth 3 (three) times daily. To help with back pain with radiation  Dispense: 90 capsule; Refill: 3 Continue- QUEtiapine (SEROQUEL) 300 MG tablet; Take 1 tablet (300 mg total) by mouth at bedtime.  Dispense: 30 tablet; Refill: 3 Restart- traZODone (DESYREL) 100 MG tablet; TAKE 1 TABLET (100 MG TOTAL) BY MOUTH AT BEDTIME AS NEEDED FOR SLEEP.  Dispense: 30 tablet; Refill: 3 -Continue hydrOXYzine (ATARAX) 10 MG tablet; Take 1 tablet (10 mg total) by mouth 3 (three) times daily as needed for anxiety.  Dispense: 90 tablet; Refill: 3 Continue- QUEtiapine (SEROQUEL) 50 MG tablet; Take 1 tablet (50 mg total) by mouth at bedtime.  Dispense: 30 tablet; Refill: 3  2. Bipolar disorder, current episode depressed,  severe, without psychotic features ([place])  Continue- gabapentin (NEURONTIN) 400 MG capsule; Take 1 capsule (400 mg total) by mouth 3 (three) times daily. To help with back pain with radiation  Dispense: 90 capsule; Refill: 3 Continue- QUEtiapine (SEROQUEL) 300 MG tablet; Take 1 tablet (300 mg total) by mouth at bedtime.  Dispense: 30 tablet; Refill: 3 Restart- traZODone (DESYREL) 100 MG tablet; TAKE 1 TABLET (100 MG TOTAL) BY MOUTH AT BEDTIME AS NEEDED FOR SLEEP.  Dispense: 30 tablet; Refill: 3 Continue- QUEtiapine (SEROQUEL) 50 MG tablet; Take 1 tablet (50 mg total) by mouth at bedtime.  Dispense: 30 tablet; Refill: 3    Follow-up in 3 months Follow-up with therapy   Salley Slaughter, NP 02/18/2022, 2:47 PM

## 2022-02-22 ENCOUNTER — Other Ambulatory Visit: Payer: Self-pay

## 2022-03-04 ENCOUNTER — Other Ambulatory Visit: Payer: Self-pay

## 2022-03-07 ENCOUNTER — Ambulatory Visit (INDEPENDENT_AMBULATORY_CARE_PROVIDER_SITE_OTHER): Payer: No Payment, Other | Admitting: Clinical

## 2022-03-07 DIAGNOSIS — F314 Bipolar disorder, current episode depressed, severe, without psychotic features: Secondary | ICD-10-CM | POA: Diagnosis not present

## 2022-03-07 NOTE — Progress Notes (Signed)
THERAPIST PROGRESS NOTE Virtual Visit via Video Note  I connected with Victoria Snyder on 03/07/22 at 10:00 AM EDT by a video enabled telemedicine application and verified that I am speaking with the correct person using two identifiers.  Location: Patient: home Provider: office   I discussed the limitations of evaluation and management by telemedicine and the availability of in person appointments. The patient expressed understanding and agreed to proceed.   Follow Up Instructions: I discussed the assessment and treatment plan with the patient. The patient was provided an opportunity to ask questions and all were answered. The patient agreed with the plan and demonstrated an understanding of the instructions.   The patient was advised to call back or seek an in-person evaluation if the symptoms worsen or if the condition fails to improve as anticipated.  Session Time: 35 minutes  Participation Level: Active  Behavioral Response: CasualAlertEuthymic  Type of Therapy: Individual Therapy  Treatment Goals addressed: Client will identify 3 cognitive patterns and beliefs that support depression  ProgressTowards Goals: Progressing  Interventions: CBT and Supportive  Summary:  Victoria Snyder is a 56 y.o. female who presents for the scheduled appointment oriented x5, appropriately dressed, and friendly.  Client denied hallucinations and delusions. Client reported on today she is doing pretty good.  Client reported she has had some difficulty with being able to get a night sleep due to muscle spasms since having her surgery.  Client reported she has not been able to go get her psychiatric medications from the pharmacy and her sister will pick them up for her. Client reported one of her older sisters is declining in health. Client reported she has been placed with hospice care because she will need round the clock care. Client reported her sister is unable to keep food down. Client  reported its hard to see her in this state and does not want to go visit her because she wants to have good memory of her. Client reported this has caused her and her siblings to discuss spending more time together. Client reported otherwise she continues to recover well from her surgery. Client reported her relationship with her son is good. Client reported her relationship is improved with his girlfriend and she wants her to take holiday pictures with the grandchildren.  Evidence of progress towards goal:  client reported reframing negative thoughts 7 days a week. Client reported positive use of social/ family support 7 days per week.  Suicidal/Homicidal: Nowithout intent/plan  Therapist Response:  Therapist began the appointment asking the client how she has been doing since last seen. Therapist used CBT to engage using active listening and positive emotional support. Therapist used CBT to engage and ask the client about her progress with health and its effect on her emotional well being. Therapist used CBT to ask the client open ended questions about stressors within family that have contributed to her depressed emotions. Therapist used CBT to normalize the client emotions and discuss using gratitude for the positive in her life.  Therapist assigned the client homework to practice gratitude and self care.    Plan: Return again in 4 weeks.  Diagnosis: bipolar disorder, current episode depressed, severe without psychotic features  Collaboration of Care: Patient refused AEB none requested by the client.  Patient/Guardian was advised Release of Information must be obtained prior to any record release in order to collaborate their care with an outside provider. Patient/Guardian was advised if they have not already done so to contact the  registration department to sign all necessary forms in order for [place] to release information regarding their care.   Consent: Patient/Guardian gives verbal  consent for treatment and assignment of benefits for services provided during this visit. Patient/Guardian expressed understanding and agreed to proceed.   [place], LCSW 03/07/2022

## 2022-03-07 NOTE — Plan of Care (Signed)
  Problem: Depression CCP Problem  1  Goal: LTG: [place] WILL SCORE LESS THAN 10 ON THE PATIENT HEALTH QUESTIONNAIRE (PHQ-9) Outcome: Progressing Goal: STG: [place] WILL IDENTIFY 3 COGNITIVE PATTERNS AND BELIEFS THAT SUPPORT DEPRESSION Outcome: Progressing   Problem: Anxiety Disorder CCP Problem  1  Goal: LTG: Patient will score less than 5 on the Generalized Anxiety Disorder 7 Scale (GAD-7) Outcome: Progressing Goal: STG: Patient will practice problem solving skills 3 times per week for the next 4 weeks Outcome: Progressing

## 2022-03-19 ENCOUNTER — Other Ambulatory Visit: Payer: Self-pay

## 2022-03-19 ENCOUNTER — Ambulatory Visit (INDEPENDENT_AMBULATORY_CARE_PROVIDER_SITE_OTHER): Payer: Commercial Managed Care - HMO | Admitting: Orthopaedic Surgery

## 2022-03-19 ENCOUNTER — Encounter: Payer: Self-pay | Admitting: Orthopaedic Surgery

## 2022-03-19 VITALS — BP 112/77 | HR 85 | Ht 69.25 in | Wt 167.0 lb

## 2022-03-19 DIAGNOSIS — Z96641 Presence of right artificial hip joint: Secondary | ICD-10-CM

## 2022-03-19 NOTE — Progress Notes (Signed)
   Post-Op Visit Note   Patient: Victoria Snyder           Date of Birth: March 06, 1966           MRN: [id number] Visit Date: 03/19/2022 PCP: Tawnya Crook, MD   Assessment & Plan: Post total of arthroplasty leg lengths equal.  She has some arthritis opposite left hip with flare and left hip symptoms postop after right total of arthroplasty.  She is back on her medication and stopped her Cymbalta and had some increased depression symptoms.  In the past she used to work out she can gradual increase her walking distance increased leg extension exercises gradually resume gym workout and I will recheck her in 2 months.  Chief Complaint:  Chief Complaint  Patient presents with   Right Hip - Follow-up, Routine Post Op    01/30/2022 Right THA   Visit Diagnoses:  1. Status post total hip replacement, right     Plan: Gradual increased distance activity.  She is walking in the house without her cane.  Recheck 2 months.  No x-ray needed on return.  Follow-Up Instructions: Return in about 2 months (around 05/19/2022).   Orders:  No orders of the defined types were placed in this encounter.  No orders of the defined types were placed in this encounter.   Imaging: No results found.  PMFS History: Patient Active Problem List   Diagnosis Date Noted   Status post total hip replacement, right 01/30/2022   Cervicalgia 07/13/2021   DDD (degenerative disc disease), lumbar 12/17/2018   Spinal stenosis of lumbar region 12/17/2018   Hypertension 03/31/2018   Low back pain with radiation 03/31/2018   History of vertigo 03/31/2018   Anxiety and depression 03/31/2018   Bipolar disorder ([place]) 03/31/2018   Past Medical History:  Diagnosis Date   Anxiety    Arthritis    Bipolar disorder ([place])    Blood transfusion without reported diagnosis    Hypertension    Hypokalemia    Low back pain with radiation    Vertigo     Family History  Problem Relation Age of Onset   Heart disease Mother         heart attack   Stroke Father    Breast cancer Neg Hx     Past Surgical History:  Procedure Laterality Date   ABDOMINAL HYSTERECTOMY     partial for fibroids   EXPLORATORY LAPAROTOMY  2011   mass in abdomen removed-uterine leiomyomata   TOTAL HIP ARTHROPLASTY Right 01/30/2022   Procedure: RIGHT TOTAL HIP ARTHROPLASTY ANTERIOR APPROACH;  Surgeon: [name], MD;  Location: [place];  Service: Orthopedics;  Laterality: Right;   Social History   Occupational History   Occupation: Unemployed  Tobacco Use   Smoking status: Former    Types: Cigarettes    Quit date: 2001    Years since quitting: 22.8   Smokeless tobacco: Never  Vaping Use   Vaping Use: Never used  Substance and Sexual Activity   Alcohol use: Yes    Comment: Drinks occasionally   Drug use: No   Sexual activity: Yes    Birth control/protection: Surgical

## 2022-03-26 ENCOUNTER — Other Ambulatory Visit: Payer: Self-pay

## 2022-03-26 ENCOUNTER — Ambulatory Visit (INDEPENDENT_AMBULATORY_CARE_PROVIDER_SITE_OTHER): Payer: Commercial Managed Care - HMO | Admitting: Family Medicine

## 2022-03-26 ENCOUNTER — Encounter: Payer: Self-pay | Admitting: Family Medicine

## 2022-03-26 VITALS — BP 118/82 | HR 82 | Temp 98.5°F | Ht 69.25 in | Wt 166.4 lb

## 2022-03-26 DIAGNOSIS — F419 Anxiety disorder, unspecified: Secondary | ICD-10-CM

## 2022-03-26 DIAGNOSIS — F3132 Bipolar disorder, current episode depressed, moderate: Secondary | ICD-10-CM | POA: Diagnosis not present

## 2022-03-26 DIAGNOSIS — F32A Depression, unspecified: Secondary | ICD-10-CM | POA: Diagnosis not present

## 2022-03-26 DIAGNOSIS — Z23 Encounter for immunization: Secondary | ICD-10-CM

## 2022-03-26 MED ORDER — DULOXETINE HCL 40 MG PO CPEP
40.0000 mg | ORAL_CAPSULE | Freq: Two times a day (BID) | ORAL | 0 refills | Status: DC
  Filled 2022-03-26 – 2022-05-06 (×2): qty 60, 30d supply, fill #0
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
## 2022-03-26 NOTE — Patient Instructions (Signed)
It was very nice to see you today!  Call psych for meds and let them know problems w/seroquel.  I refilled cymbalta for 1 month to get you thru.     PLEASE NOTE:  If you had any lab tests please let us know if you have not heard back within a few days. You may see your results on MyChart before we have a chance to review them but we will give you a call once they are reviewed by [place]. If we ordered any referrals today, please let us know if you have not heard from their office within the next week.   Please try these tips to maintain a healthy lifestyle:  Eat most of your calories during the day when you are active. Eliminate processed foods including packaged sweets (pies, cakes, cookies), reduce intake of potatoes, white bread, white pasta, and white rice. Look for whole grain options, oat flour or almond flour.  Each meal should contain half fruits/vegetables, one quarter protein, and one quarter carbs (no bigger than a computer mouse).  Cut down on sweet beverages. This includes juice, soda, and sweet tea. Also watch fruit intake, though this is a healthier sweet option, it still contains natural sugar! Limit to 3 servings daily.  Drink at least 1 glass of water with each meal and aim for at least 8 glasses per day  Exercise at least 150 minutes every week.

## 2022-03-27 ENCOUNTER — Other Ambulatory Visit: Payer: Self-pay

## 2022-03-28 ENCOUNTER — Other Ambulatory Visit: Payer: Self-pay

## 2022-04-01 ENCOUNTER — Other Ambulatory Visit: Payer: Self-pay

## 2022-04-03 ENCOUNTER — Telehealth: Payer: Self-pay | Admitting: *Deleted

## 2022-04-03 NOTE — Telephone Encounter (Signed)
Called patient to inform her that PA for cymbalta 40 mg dosing was denied on 03/30/2022. Gave recommendation that patient could call insurance, call psych or Duloxetine 60 mg could be sent to the pharmacy per Dr. Cherlynn Kaiser. Patient stated that she would call insurance to find out what she can do and would give office a call back.

## 2022-04-05 NOTE — Telephone Encounter (Signed)
Left message to return my call.  

## 2022-04-05 NOTE — Telephone Encounter (Signed)
Returned call to patient and she stated that she has tried one of the medications on the denial letter, doesn't remember the dose. Patient would like an appeal done. Patient informed that I will contact her when appeal is completed.

## 2022-04-23 ENCOUNTER — Encounter: Payer: Self-pay | Admitting: Orthopaedic Surgery

## 2022-04-23 ENCOUNTER — Ambulatory Visit (INDEPENDENT_AMBULATORY_CARE_PROVIDER_SITE_OTHER): Payer: Commercial Managed Care - HMO | Admitting: Orthopaedic Surgery

## 2022-04-23 VITALS — BP 117/80 | HR 87 | Ht 69.0 in | Wt 166.0 lb

## 2022-04-23 DIAGNOSIS — Z96641 Presence of right artificial hip joint: Secondary | ICD-10-CM

## 2022-04-23 NOTE — Progress Notes (Signed)
   Post-Op Visit Note   Patient: PATTI SHORB           Date of Birth: December 01, 1965           MRN: [id number] Visit Date: 04/23/2022 PCP: Tawnya Crook, MD   Assessment & Plan: Follow-up right total of arthroplasty.  She has significant arthritis in the opposite left hip.  She has some mild keloid formation over the right hip and has been using thicker dressing with what looks like Xeroform.  She is walking well with her right total hip arthroplasty.  I will check her back in a month.  We discussed keloid formation and she does have some keloid formation.  Chief Complaint:  Chief Complaint  Patient presents with   Right Hip - Pain    01/30/2022 Right THA   Visit Diagnoses:  1. Status post total hip replacement, right     Plan: Recheck 1 month.  Continue ambulation.  Follow-Up Instructions: Return in about 1 month (around 05/23/2022).   Orders:  No orders of the defined types were placed in this encounter.  No orders of the defined types were placed in this encounter.   Imaging: No results found.  PMFS History: Patient Active Problem List   Diagnosis Date Noted   Status post total hip replacement, right 01/30/2022   Cervicalgia 07/13/2021   DDD (degenerative disc disease), lumbar 12/17/2018   Spinal stenosis of lumbar region 12/17/2018   Hypertension 03/31/2018   Low back pain with radiation 03/31/2018   History of vertigo 03/31/2018   Anxiety and depression 03/31/2018   Bipolar disorder ([place]) 03/31/2018   Past Medical History:  Diagnosis Date   Anxiety    Arthritis    Bipolar disorder ([place])    Blood transfusion without reported diagnosis    Hypertension    Hypokalemia    Low back pain with radiation    Vertigo     Family History  Problem Relation Age of Onset   Heart disease Mother        heart attack   Stroke Father    Breast cancer Neg Hx     Past Surgical History:  Procedure Laterality Date   ABDOMINAL HYSTERECTOMY     partial for fibroids    EXPLORATORY LAPAROTOMY  2011   mass in abdomen removed-uterine leiomyomata   TOTAL HIP ARTHROPLASTY Right 01/30/2022   Procedure: RIGHT TOTAL HIP ARTHROPLASTY ANTERIOR APPROACH;  Surgeon: [name], MD;  Location: [place];  Service: Orthopedics;  Laterality: Right;   Social History   Occupational History   Occupation: Unemployed  Tobacco Use   Smoking status: Former    Types: Cigarettes    Quit date: 2001    Years since quitting: 22.9   Smokeless tobacco: Never  Vaping Use   Vaping Use: Never used  Substance and Sexual Activity   Alcohol use: Yes    Comment: Drinks occasionally   Drug use: No   Sexual activity: Yes    Birth control/protection: Surgical

## 2022-04-26 ENCOUNTER — Other Ambulatory Visit: Payer: Self-pay

## 2022-04-26 ENCOUNTER — Encounter (HOSPITAL_COMMUNITY): Payer: Self-pay | Admitting: Psychiatry

## 2022-04-26 ENCOUNTER — Telehealth (INDEPENDENT_AMBULATORY_CARE_PROVIDER_SITE_OTHER): Payer: Medicaid Other | Admitting: Psychiatry

## 2022-04-26 DIAGNOSIS — F419 Anxiety disorder, unspecified: Secondary | ICD-10-CM

## 2022-04-26 DIAGNOSIS — F314 Bipolar disorder, current episode depressed, severe, without psychotic features: Secondary | ICD-10-CM

## 2022-04-26 MED ORDER — TRAZODONE HCL 100 MG PO TABS
100.0000 mg | ORAL_TABLET | Freq: Every evening | ORAL | 3 refills | Status: DC | PRN
  Filled 2022-04-26: qty 30, 30d supply, fill #0

## 2022-04-26 MED ORDER — QUETIAPINE FUMARATE 300 MG PO TABS
300.0000 mg | ORAL_TABLET | Freq: Every day | ORAL | 3 refills | Status: DC
  Filled 2022-04-26: qty 30, 30d supply, fill #0

## 2022-04-26 MED ORDER — VENLAFAXINE HCL ER 75 MG PO CP24
75.0000 mg | ORAL_CAPSULE | Freq: Every day | ORAL | 3 refills | Status: DC
  Filled 2022-04-26: qty 30, 30d supply, fill #0

## 2022-04-26 MED ORDER — BUSPIRONE HCL 15 MG PO TABS
15.0000 mg | ORAL_TABLET | Freq: Three times a day (TID) | ORAL | 3 refills | Status: DC
  Filled 2022-04-26: qty 90, 30d supply, fill #0

## 2022-04-26 MED ORDER — HYDROXYZINE HCL 10 MG PO TABS
10.0000 mg | ORAL_TABLET | Freq: Three times a day (TID) | ORAL | 3 refills | Status: DC | PRN
  Filled 2022-04-26: qty 90, 30d supply, fill #0

## 2022-04-26 MED ORDER — QUETIAPINE FUMARATE 50 MG PO TABS
50.0000 mg | ORAL_TABLET | Freq: Every day | ORAL | 3 refills | Status: DC
  Filled 2022-04-26: qty 30, 30d supply, fill #0

## 2022-04-26 NOTE — Progress Notes (Signed)
BH MD/PA/NP OP Progress Note Virtual Visit via Video Note  I connected with Victoria Snyder on 04/26/22 at  9:00 AM EST by a video enabled telemedicine application and verified that I am speaking with the correct person using two identifiers.  Location: Patient: Home Provider: Clinic   I discussed the limitations of evaluation and management by telemedicine and the availability of in person appointments. The patient expressed understanding and agreed to proceed.  I provided 30 minutes of non-face-to-face time during this encounter.         04/26/2022 9:43 AM Victoria Snyder  MRN:  [id number]  Chief Complaint:  "I have been been down and depressed"   HPI: 56 year old female seen today for follow up psychiatric evaluation. She has a  psychiatric history of Bipolar 2, anxiety, and depression. She is currently being managed on Seroquel 350 mg nighty, Trazodone 100 mg nightly, Cymbalta 40 twice daily, Buspar 15 mg three times daily, and Hydroxyzine 10 mg three times daily as needed.  Patient informed Probation officer that her Cymbalta is not covered by her insurance and she has not taken it in awhile. She reports that her other medications are somewhat effective in managing her psychiatric conditions.   Today she was well-groomed, pleasant, cooperative, engaged in conversation, and maintained eye contact.  She informed Probation officer that she has been down and depressed.  She informed Probation officer that 2 weeks ago her sister passed away.  She also notes that she is having issues with her female friend who she lives with.  She notes that she has been snapping, irritable, and having racing thoughts.  She informed Probation officer that her friend has asked her to leave.  Patient informed Probation officer that she is anxious about leaving.  She reports that her husband is trying to take her home.  She also notes that she worries about her family and her health.  Provider conducted a GAD-7 and patient scored a 21, at her last visit she scored a  14.  Provider also conducted PHQ-9 and patient scored a 23, at her last visit she scored a 17.  Patient reports sleeping 5 hours nightly and notes that her appetite fluctuates.  Today she endorses passive SI but denies wanting to harm herself.  She denies SI/HI/AVH.  Patient does endorse auditory hallucinations noting that when she is alone she hears the voices more clearly.   Patient reports that she continues to have leg, hip, and back pain.  She reports that she received surgery on her right hip but may have surgery on her left hip if the pain worsens.  Provider recommended increasing Seroquel however patient was not agreeable.  At this time Cymbalta discontinued as patient insurance will not cover it.  She notes that she will try to appeal it but would like to be started on another antidepressant to help her anxiety and depression.  Today Effexor 75 mg was started to help manage anxiety, depression, and pain.  She will continue all other medications as prescribed.  No other concerns noted at this time.      Visit Diagnosis:    ICD-10-CM   1. Anxiety and depression  F41.9 busPIRone (BUSPAR) 15 MG tablet   F32.A hydrOXYzine (ATARAX) 10 MG tablet    QUEtiapine (SEROQUEL) 50 MG tablet    QUEtiapine (SEROQUEL) 300 MG tablet    traZODone (DESYREL) 100 MG tablet    2. Bipolar disorder, current episode depressed, severe, without psychotic features ([place])  F31.4 QUEtiapine (SEROQUEL) 50  MG tablet    QUEtiapine (SEROQUEL) 300 MG tablet    traZODone (DESYREL) 100 MG tablet      Past Psychiatric History: anxiety, depression, insomnia, and bipolar 2 disorder. Past Medical History:  Past Medical History:  Diagnosis Date   Anxiety    Arthritis    Bipolar disorder ([place])    Blood transfusion without reported diagnosis    Hypertension    Hypokalemia    Low back pain with radiation    Vertigo     Past Surgical History:  Procedure Laterality Date   ABDOMINAL HYSTERECTOMY     partial for  fibroids   EXPLORATORY LAPAROTOMY  2011   mass in abdomen removed-uterine leiomyomata   TOTAL HIP ARTHROPLASTY Right 01/30/2022   Procedure: RIGHT TOTAL HIP ARTHROPLASTY ANTERIOR APPROACH;  Surgeon: [name], MD;  Location: [facility];  Service: Orthopedics;  Laterality: Right;    Family Psychiatric History: Notes that her half brother had a mental illness however she is unaware of what it was.     Family History:  Family History  Problem Relation Age of Onset   Heart disease Mother        heart attack   Stroke Father    Breast cancer Neg Hx     Social History:  Social History   Socioeconomic History   Marital status: Single    Spouse name: Not on file   Number of children: 1   Years of education: Not on file   Highest education level: 12th grade  Occupational History   Occupation: Unemployed  Tobacco Use   Smoking status: Former    Types: Cigarettes    Quit date: 2001    Years since quitting: 22.9   Smokeless tobacco: Never  Vaping Use   Vaping Use: Never used  Substance and Sexual Activity   Alcohol use: Yes    Comment: Drinks occasionally   Drug use: No   Sexual activity: Yes    Birth control/protection: Surgical  Other Topics Concern   Not on file  Social History Narrative   Separated.  1 child   Not working.    Social Determinants of Health   Financial Resource Strain: Not on file  Food Insecurity: No Food Insecurity (05/03/2021)   Hunger Vital Sign    Worried About Running Out of Food in the Last Year: Never true    Ran Out of Food in the Last Year: Never true  Transportation Needs: No Transportation Needs (05/03/2021)   PRAPARE - Hydrologist (Medical): No    Lack of Transportation (Non-Medical): No  Physical Activity: Not on file  Stress: Not on file  Social Connections: Not on file    Allergies: No Known Allergies  Metabolic Disorder Labs: Lab Results  Component Value Date   HGBA1C 5.2 06/06/2021   No results  found for: "PROLACTIN" Lab Results  Component Value Date   CHOL 193 06/04/2021   TRIG 118.0 06/04/2021   HDL 64.60 06/04/2021   CHOLHDL 3 06/04/2021   VLDL 23.6 06/04/2021   LDLCALC 105 (H) 06/04/2021   LDLCALC 94 08/18/2020   Lab Results  Component Value Date   TSH 1.04 06/04/2021   TSH 1.840 10/22/2017    Therapeutic Level Labs: No results found for: "LITHIUM" No results found for: "VALPROATE" No results found for: "CBMZ"  Current Medications: Current Outpatient Medications  Medication Sig Dispense Refill   venlafaxine XR (EFFEXOR XR) 75 MG 24 hr capsule Take 1 capsule (  75 mg total) by mouth daily. 30 capsule 3   amLODipine (NORVASC) 5 MG tablet Take 1 tablet (5 mg total) by mouth daily. 90 tablet 3   Ascorbic Acid (VITAMIN C) 1000 MG tablet Take 1,000 mg by mouth daily.     aspirin EC 325 MG tablet Take 1 tablet (325 mg total) by mouth daily. MUST TAKE AT LEAST 4 WEEKS POSTOP FOR DVT PROPHYLAXIS 30 tablet 0   busPIRone (BUSPAR) 15 MG tablet Take 1 tablet (15 mg total) by mouth 3 (three) times daily. 90 tablet 3   DULoxetine HCl 40 MG CPEP Take 1 capsule (40 mg) by mouth 2 (two) times daily at 10 AM and 5 PM. 60 capsule 0   gabapentin (NEURONTIN) 400 MG capsule Take 1 capsule (400 mg total) by mouth 3 (three) times daily. To help with back pain with radiation 90 capsule 3   hydrOXYzine (ATARAX) 10 MG tablet Take 1 tablet (10 mg total) by mouth 3 (three) times daily as needed for anxiety. 90 tablet 3   meclizine (ANTIVERT) 25 MG tablet Take 1 tablet (25 mg total) by mouth 3 (three) times daily as needed for dizziness. 30 tablet 3   methocarbamol (ROBAXIN) 500 MG tablet Take 1 tablet (500 mg total) by mouth every 6 (six) hours as needed for muscle spasms. 60 tablet 0   Multiple Vitamins-Minerals (MULTIVITAMIN WITH MINERALS) tablet Take 1 tablet by mouth daily.     oxyCODONE-acetaminophen (PERCOCET) 5-325 MG tablet Take 1-2 tablets by mouth every 6 (six) hours as needed for severe  pain. 40 tablet 0   QUEtiapine (SEROQUEL) 300 MG tablet Take 1 tablet (300 mg total) by mouth at bedtime. 30 tablet 3   QUEtiapine (SEROQUEL) 50 MG tablet Take 1 tablet (50 mg total) by mouth at bedtime. 30 tablet 3   traZODone (DESYREL) 100 MG tablet Take 1 tablet (100 mg total) by mouth at bedtime as needed for sleep. 30 tablet 3   zinc gluconate 50 MG tablet Take 50 mg by mouth daily.     No current facility-administered medications for this visit.     Musculoskeletal: Strength & Muscle Tone: within normal limits and telehealth visit Gait & Station: normal, telehealth visit Patient leans: N/A  Psychiatric Specialty Exam: Review of Systems  There were no vitals taken for this visit.There is no height or weight on file to calculate BMI.  General Appearance: Well Groomed  Eye Contact:  Good  Speech:  Clear and Coherent and Normal Rate  Volume:  Normal  Mood:  Anxious and Depressed  Affect:  Congruent  Thought Process:  Coherent, Goal Directed and Linear  Orientation:  Full (Time, Place, and Person)  Thought Content: Logical and Hallucinations: Auditory   Suicidal Thoughts:  Yes.  without intent/plan  Homicidal Thoughts:  No  Memory:  Immediate;   Good Recent;   Good Remote;   Good  Judgement:  Good  Insight:  Good  Psychomotor Activity:  Normal  Concentration:  Concentration: Good and Attention Span: Good  Recall:  Good  Fund of Knowledge: Good  Language: Good  Akathisia:  No  Handed:  Right  AIMS (if indicated): Not done  Assets:  Communication Skills Desire for Improvement Financial Resources/Insurance Housing Social Support  ADL's:  Intact  Cognition: WNL  Sleep:  Fair   Screenings: GAD-7    Flowsheet Row Video Visit from 04/26/2022 in [facility] Video Visit from 02/18/2022 in [facility] Counselor from 02/15/2022  in [facility] Video Visit from 11/15/2021 in [facility] Video Visit from 06/01/2021 in [facility]  Total GAD-7 Score '21 14 11 18 20      '$ PHQ2-9    Flowsheet Row Video Visit from 04/26/2022 in [facility] Video Visit from 02/18/2022 in [facility] Counselor from 02/15/2022 in [facility] Video Visit from 11/15/2021 in [facility] Office Visit from 10/04/2021 in [place]  PHQ-2 Total Score '5 4 2 6 '$ 0  PHQ-9 Total Score '23 17 10 18 '$ --      Flowsheet Row Video Visit from 04/26/2022 in [facility] Video Visit from 02/18/2022 in [place] [facility] from 01/24/2022 in [facility] PREADMISSION TESTING  C-SSRS RISK CATEGORY Error: Q7 should not be populated when Q6 is No Error: Q7 should not be populated when Q6 is No No Risk        Assessment and Plan: Patient endorses symptoms of hypomania, anxiety, depression and AH.  Provider recommended increasing Seroquel however patient was not agreeable.  At this time Cymbalta discontinued as patient insurance will not cover it.  She notes that she will try to appeal it but would like to be started on another antidepressant to help her anxiety and depression.  Today Effexor 75 mg was started to help manage anxiety, depression, and pain.  She will continue all other medications as prescribed.   1. Anxiety and depression  Start- venlafaxine XR (EFFEXOR XR) 75 MG 24 hr capsule; Take 1 capsule (75 mg total) by mouth daily.  Dispense: 30 capsule; Refill: 3 Continue- busPIRone (BUSPAR) 15 MG tablet; Take 1 tablet (15 mg total) by mouth 3 (three) times daily.  Dispense: 90 tablet; Refill: 3 Continue- hydrOXYzine (ATARAX) 10 MG tablet; Take 1 tablet (10 mg total) by mouth 3 (three) times daily as needed for anxiety.  Dispense: 90 tablet; Refill:  3 Continue- QUEtiapine (SEROQUEL) 50 MG tablet; Take 1 tablet (50 mg total) by mouth at bedtime.  Dispense: 30 tablet; Refill: 3 Continue- QUEtiapine (SEROQUEL) 300 MG tablet; Take 1 tablet (300 mg total) by mouth at bedtime.  Dispense: 30 tablet; Refill: 3 Continue- traZODone (DESYREL) 100 MG tablet; Take 1 tablet (100 mg total) by mouth at bedtime as needed for sleep.  Dispense: 30 tablet; Refill: 3  2. Bipolar disorder, current episode depressed, severe, without psychotic features ([place])  Continue- QUEtiapine (SEROQUEL) 50 MG tablet; Take 1 tablet (50 mg total) by mouth at bedtime.  Dispense: 30 tablet; Refill: 3 Continue- QUEtiapine (SEROQUEL) 300 MG tablet; Take 1 tablet (300 mg total) by mouth at bedtime.  Dispense: 30 tablet; Refill: 3 Continue- traZODone (DESYREL) 100 MG tablet; Take 1 tablet (100 mg total) by mouth at bedtime as needed for sleep.  Dispense: 30 tablet; Refill: 3   Follow-up in 3 months Follow-up with therapy   Salley Slaughter, NP 04/26/2022, 9:43 AM

## 2022-04-29 ENCOUNTER — Other Ambulatory Visit: Payer: Self-pay

## 2022-05-01 ENCOUNTER — Ambulatory Visit (INDEPENDENT_AMBULATORY_CARE_PROVIDER_SITE_OTHER): Payer: Medicaid Other | Admitting: Clinical

## 2022-05-01 DIAGNOSIS — F314 Bipolar disorder, current episode depressed, severe, without psychotic features: Secondary | ICD-10-CM | POA: Diagnosis not present

## 2022-05-02 NOTE — Progress Notes (Signed)
THERAPIST PROGRESS NOTE Virtual Visit via Video Note  I connected with Victoria Snyder on 05/01/2022 at 11:00 AM EST by a video enabled telemedicine application and verified that I am speaking with the correct person using two identifiers.  Location: Patient: home Provider: office   I discussed the limitations of evaluation and management by telemedicine and the availability of in person appointments. The patient expressed understanding and agreed to proceed.   Follow Up Instructions: I discussed the assessment and treatment plan with the patient. The patient was provided an opportunity to ask questions and all were answered. The patient agreed with the plan and demonstrated an understanding of the instructions.   The patient was advised to call back or seek an in-person evaluation if the symptoms worsen or if the condition fails to improve as anticipated.   Session Time: 30 minutes  Participation Level: Active  Behavioral Response: CasualAlertDepressed  Type of Therapy: Individual Therapy  Treatment Goals addressed: client will identify 3 cognitive patterns and beliefs that support depression  ProgressTowards Goals: Progressing  Interventions: CBT and Supportive  Summary:  Victoria Snyder is a 56 y.o. female who presents for the scheduled appointment oriented times five, appropriately dressed, and friendly. Client denied hallucinations and delusions. Client reported on today she is doing fairly okay. Client reported her older sister passed away. Client reported she has mixed emotions because it took her along time to get over her mothers death. Client reported this sister always called her to check in and talk to her the most. Client reported she is staying close with her sisters and they check on her regularly. Client reported her niece, the daughter of sister who passed has been trying to get her out the house. Client reported she is thinking about going to church with her.  Client reported she still has issues with her husband ongoing about settling some financial issues. Client reported they will be going to court by the end of the year. Client reported she tries to improve her irritability but it can still be a issue with her friend who is letting her stay with him. Evidence of progress towards goal:  client reported creating a routine at least 5 days per week to help with depression.  Suicidal/Homicidal: Nowithout intent/plan  Therapist Response:  Therapist began the appointment asking the client how she has been doing since last seen. Therapist used CBT to engage using active listening and positive emotional support. Therapist used CBT to engage and ask the client how she is doing with processing her grief. Therapist used CBT to normalize the clients emotional response. Therapist used CBT to reinforce communicating what is on her mind to others so it does not project as anger towards others. Therapist used CBT to discuss creating a routine to incorporate her at hoe physical rehab activity and her positive activity. Therapist used CBT ask the client to identify her progress with frequency of use with coping skills with continued practice in her daily activity.    Therapist assigned the client homework to practice self care.   Plan: Return again in 3 weeks.  Diagnosis: bipolar disorder, current episode depressed, severe, without psychotic features  Collaboration of Care: Patient refused AEB none requested by the client.  Patient/Guardian was advised Release of Information must be obtained prior to any record release in order to collaborate their care with an outside provider. Patient/Guardian was advised if they have not already done so to contact the registration department to sign all necessary  forms in order for [place] to release information regarding their care.   Consent: Patient/Guardian gives verbal consent for treatment and assignment of benefits for  services provided during this visit. Patient/Guardian expressed understanding and agreed to proceed.   [place], LCSW 05/02/2022

## 2022-05-03 ENCOUNTER — Other Ambulatory Visit: Payer: Self-pay

## 2022-05-06 ENCOUNTER — Other Ambulatory Visit: Payer: Self-pay

## 2022-05-06 ENCOUNTER — Other Ambulatory Visit: Payer: Self-pay | Admitting: Family Medicine

## 2022-05-06 DIAGNOSIS — F32A Depression, unspecified: Secondary | ICD-10-CM

## 2022-05-06 NOTE — Telephone Encounter (Signed)
Per psych, changed to effexor

## 2022-05-13 ENCOUNTER — Other Ambulatory Visit: Payer: Self-pay

## 2022-05-15 ENCOUNTER — Ambulatory Visit (INDEPENDENT_AMBULATORY_CARE_PROVIDER_SITE_OTHER): Payer: Medicaid Other | Admitting: Clinical

## 2022-05-15 DIAGNOSIS — F314 Bipolar disorder, current episode depressed, severe, without psychotic features: Secondary | ICD-10-CM
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
## 2022-05-23 ENCOUNTER — Ambulatory Visit (INDEPENDENT_AMBULATORY_CARE_PROVIDER_SITE_OTHER): Payer: Commercial Managed Care - HMO | Admitting: Physician Assistant

## 2022-05-23 ENCOUNTER — Encounter: Payer: Self-pay | Admitting: Physician Assistant

## 2022-05-23 DIAGNOSIS — Z96641 Presence of right artificial hip joint: Secondary | ICD-10-CM

## 2022-05-23 NOTE — Progress Notes (Signed)
   Post-Op Visit Note   Patient: Victoria Snyder           Date of Birth: 13-Aug-1965           MRN: [id number] Visit Date: 05/23/2022 PCP: Tawnya Crook, MD   Assessment & Plan:  Chief Complaint:  Chief Complaint  Patient presents with   Right Hip - Follow-up    Right total hip arthroplasty 01/30/2022   Visit Diagnoses:  1. Status post total hip replacement, right     Plan: Patient is a pleasant 56 year old female patient of Dr. Lorin Mercy who comes in today about 3-1/2 months status post right total hip replacement 01/30/2022.  She has been doing well in regards to the hip joint itself.  She does note that her incision is taking a while to heal as there is a keloid.  She is trying to keep this moist.  Examination of her right hip reveals a well-healed incision with a keloid.  No evidence of infection or cellulitis.  She has painless hip flexion and logroll.  She is neurovascular intact distally.  At this point, she will continue working on her home exercise program.  I recommended Mederma or vitamin E oil to rub over the keloid.  She will follow-up with Dr. Lorin Mercy in 3 months for repeat evaluation.  Call with concerns or questions in the meantime.  Follow-Up Instructions: Return in about 3 months (around 08/22/2022) for with Dr. Lorin Mercy.   Orders:  No orders of the defined types were placed in this encounter.  No orders of the defined types were placed in this encounter.   Imaging: No new imaging  PMFS History: Patient Active Problem List   Diagnosis Date Noted   Status post total hip replacement, right 01/30/2022   Cervicalgia 07/13/2021   DDD (degenerative disc disease), lumbar 12/17/2018   Spinal stenosis of lumbar region 12/17/2018   Hypertension 03/31/2018   Low back pain with radiation 03/31/2018   History of vertigo 03/31/2018   Anxiety and depression 03/31/2018   Bipolar disorder ([place]) 03/31/2018   Past Medical History:  Diagnosis Date   Anxiety    Arthritis     Bipolar disorder ([place])    Blood transfusion without reported diagnosis    Hypertension    Hypokalemia    Low back pain with radiation    Vertigo     Family History  Problem Relation Age of Onset   Heart disease Mother        heart attack   Stroke Father    Breast cancer Neg Hx     Past Surgical History:  Procedure Laterality Date   ABDOMINAL HYSTERECTOMY     partial for fibroids   EXPLORATORY LAPAROTOMY  2011   mass in abdomen removed-uterine leiomyomata   TOTAL HIP ARTHROPLASTY Right 01/30/2022   Procedure: RIGHT TOTAL HIP ARTHROPLASTY ANTERIOR APPROACH;  Surgeon: [name], MD;  Location: [facility];  Service: Orthopedics;  Laterality: Right;   Social History   Occupational History   Occupation: Unemployed  Tobacco Use   Smoking status: Former    Types: Cigarettes    Quit date: 2001    Years since quitting: 23.0   Smokeless tobacco: Never  Vaping Use   Vaping Use: Never used  Substance and Sexual Activity   Alcohol use: Yes    Comment: Drinks occasionally   Drug use: No   Sexual activity: Yes    Birth control/protection: Surgical

## 2022-05-28 ENCOUNTER — Ambulatory Visit: Payer: Commercial Managed Care - HMO | Admitting: Orthopaedic Surgery

## 2022-05-29 ENCOUNTER — Ambulatory Visit: Payer: Commercial Managed Care - HMO | Admitting: Orthopaedic Surgery

## 2022-06-03 ENCOUNTER — Other Ambulatory Visit: Payer: Self-pay

## 2022-06-04 ENCOUNTER — Telehealth: Payer: Self-pay | Admitting: Family Medicine

## 2022-06-04 NOTE — Telephone Encounter (Signed)
Requesting TOC from [place] to [employer]

## 2022-06-04 NOTE — Telephone Encounter (Signed)
Ok with me 

## 2022-06-06 NOTE — Telephone Encounter (Signed)
Pt has been sch for 06-19-2022

## 2022-06-07 ENCOUNTER — Ambulatory Visit (INDEPENDENT_AMBULATORY_CARE_PROVIDER_SITE_OTHER): Payer: Medicaid Other | Admitting: Clinical

## 2022-06-07 DIAGNOSIS — F314 Bipolar disorder, current episode depressed, severe, without psychotic features: Secondary | ICD-10-CM

## 2022-06-08 NOTE — Progress Notes (Signed)
THERAPIST PROGRESS NOTE Virtual Visit via Video Note  I connected with Victoria Snyder on 06/07/2022 at 11:00 AM EST by a video enabled telemedicine application and verified that I am speaking with the correct person using two identifiers.  Location: Patient: parked car Provider: office   I discussed the limitations of evaluation and management by telemedicine and the availability of in person appointments. The patient expressed understanding and agreed to proceed.   Follow Up Instructions: I discussed the assessment and treatment plan with the patient. The patient was provided an opportunity to ask questions and all were answered. The patient agreed with the plan and demonstrated an understanding of the instructions.   The patient was advised to call back or seek an in-person evaluation if the symptoms worsen or if the condition fails to improve as anticipated.   Session Time: 25 minutes  Participation Level: Active  Behavioral Response: CasualAlertEuthymic  Type of Therapy: Individual Therapy  Treatment Goals addressed: client will identify 3 cognitive patterns and beliefs that support depression ProgressTowards Goals: Progressing  Interventions: CBT and Supportive  Summary:  Victoria Snyder is a 57 y.o. female who presents for the scheduled appointment oriented times five, appropriately dressed, and friendly. Client denied hallucinations, delusions, suicidal and homicidal ideations. Client reported on today she is doing fairly well. Client reported she is out with her sister. Client reported she is waiting in the car while her sister is at a doctor appointment. Client reported her sister wanted her to accompany her. Client reported she has been staying in contact with her sisters. Client reported they try to get her out of the house by inviting her to different things. Client reported they are understanding of what she is and is not comfortable with doing which makes her happy.  Client reported she has also been thinking about wanting to spend more time with her grandchildren because they have kept her encouraged. Client reported she will speak with her son about that. Client reported she did get approved for medicaid and is happy so she can find doctors to help with her conditions. Client reported she and her friend who she is staying with had a talk following the last argument and have seemed to work things out. Evidence of progress towards goal:  Client reported she has started jounaling at least 3x out of 7 days per week to help with her mood.   Suicidal/Homicidal: Nowithout intent/plan  Therapist Response:  Therapist began the appointment asking the client how she has been doing since last seen. Therapist used CBT to engage using active listening and positive emotional support. Therapist used CBT to engage and ask the client how she has practiced effective communication skills to help resolve stressors in interpersonal relationships. Therapist used CBT to discuss healthy coping skills to combat triggers. Therapist used CBT ask the client to identify her progress with frequency of use with coping skills with continued practice in her daily activity.    Therapist assigned the client homework to practice self care.   Plan: Return again in 3 weeks.  Diagnosis: bipolar disorder, current episode depressed, severe, without psychotic features  Collaboration of Care: Patient refused AEB none requested by the client.  Patient/Guardian was advised Release of Information must be obtained prior to any record release in order to collaborate their care with an outside provider. Patient/Guardian was advised if they have not already done so to contact the registration department to sign all necessary forms in order for [place] to release  information regarding their care.   Consent: Patient/Guardian gives verbal consent for treatment and assignment of benefits for services provided  during this visit. Patient/Guardian expressed understanding and agreed to proceed.   [place], LCSW 06/07/2022

## 2022-06-19 ENCOUNTER — Other Ambulatory Visit: Payer: Self-pay

## 2022-06-19 ENCOUNTER — Telehealth: Payer: Self-pay | Admitting: *Deleted

## 2022-06-19 ENCOUNTER — Ambulatory Visit (INDEPENDENT_AMBULATORY_CARE_PROVIDER_SITE_OTHER): Payer: 59 | Admitting: Family Medicine

## 2022-06-19 ENCOUNTER — Encounter: Payer: Self-pay | Admitting: Family Medicine

## 2022-06-19 VITALS — BP 102/78 | HR 80 | Temp 98.6°F | Ht 69.0 in | Wt 165.5 lb

## 2022-06-19 DIAGNOSIS — F419 Anxiety disorder, unspecified: Secondary | ICD-10-CM

## 2022-06-19 DIAGNOSIS — F32A Depression, unspecified: Secondary | ICD-10-CM | POA: Diagnosis not present

## 2022-06-19 DIAGNOSIS — Z1231 Encounter for screening mammogram for malignant neoplasm of breast: Secondary | ICD-10-CM

## 2022-06-19 DIAGNOSIS — L720 Epidermal cyst: Secondary | ICD-10-CM

## 2022-06-19 DIAGNOSIS — Z23 Encounter for immunization: Secondary | ICD-10-CM

## 2022-06-19 DIAGNOSIS — M545 Low back pain, unspecified: Secondary | ICD-10-CM | POA: Diagnosis not present

## 2022-06-19 DIAGNOSIS — R69 Illness, unspecified: Secondary | ICD-10-CM | POA: Diagnosis not present

## 2022-06-19 DIAGNOSIS — Z1211 Encounter for screening for malignant neoplasm of colon: Secondary | ICD-10-CM

## 2022-06-19 MED ORDER — DULOXETINE HCL 60 MG PO CPEP
60.0000 mg | ORAL_CAPSULE | Freq: Every day | ORAL | 1 refills | Status: DC
  Filled 2022-06-19: qty 30, 30d supply, fill #0
  Filled 2022-09-09: qty 30, 30d supply, fill #1

## 2022-06-19 NOTE — Telephone Encounter (Signed)
Message sent to the referral coordinator as fax received from [place] requesting notes and imaging be faxed to the [place] office and to call [phone] opt#2 with questions.

## 2022-06-19 NOTE — Telephone Encounter (Signed)
-----  Message from Lennox Solders sent at 06/19/2022  1:42 PM EST ----- Please abstract and route to provider.

## 2022-06-19 NOTE — Assessment & Plan Note (Signed)
I seeing providers for this, last visit was on 04/26/2022 for medication management, pt never picked up the effexor, I advised that we could send a new rx for cymbalta 60 mg daily now that she has new insurance, pt is agreeable to this dosing. If this medication is not covered then I instructed the patient to get the effexor 75 mg daily filled instead.

## 2022-06-19 NOTE — Progress Notes (Signed)
Established Patient Office Visit  Subjective   Patient ID: Victoria Snyder, female    DOB: 09-12-1965  Age: 57 y.o. MRN: [id number]  Chief Complaint  Patient presents with   Establish Care    Pt is here for transition of care visit. I have reviewed her last note from Dr. Cherlynn Kaiser. Patient reports she was having trouble getting the cymbalta rx -- states that her insurance has changed recently and would like a new rx. I reviewed her note from her [facility] provider who had written her effexor 75 mg daily in December 2023. Pt never picked up the rx. We discussed changing the cymbalta rx to 60 mg once daily since this is a more common dose it might be more affordable. [place] is agreeable to this plan.  HTN-- BP is well controlled today, she reports compliance with the amlodipine 5 mg daily, she denies any side effects to this medication.   Patient is also reporting a "lump" on the right side of her chin. She reports it started out as a smaller cyst, however it has grown in size to the point that there is facial assymmetry. Patient denies any tenderness, pain, redness or itching of the lump. States that it is soft, she has been using hot compresses on it but it hasn't reduced the size.   We reviewed her HM, she will be due for her mammogram soon and also her colonoscopy. I have placed orders for these tests.   I reviewed her immunizations, she is due for her 2nd shingles vaccine.    Current Outpatient Medications  Medication Instructions   amLODipine (NORVASC) 5 mg, Oral, Daily   aspirin EC 325 mg, Oral, Daily, MUST TAKE AT LEAST 4 WEEKS POSTOP FOR DVT PROPHYLAXIS   busPIRone (BUSPAR) 15 mg, Oral, 3 times daily   DULoxetine (CYMBALTA) 60 mg, Oral, Daily   gabapentin (NEURONTIN) 400 mg, Oral, 3 times daily, To help with back pain with radiation   hydrOXYzine (ATARAX) 10 mg, Oral, 3 times daily PRN   meclizine (ANTIVERT) 25 mg, Oral, 3 times daily PRN   methocarbamol (ROBAXIN) 500 mg, Oral, Every 6  hours PRN   Multiple Vitamins-Minerals (MULTIVITAMIN WITH MINERALS) tablet 1 tablet, Oral, Daily   oxyCODONE-acetaminophen (PERCOCET) 5-325 MG tablet 1-2 tablets, Oral, Every 6 hours PRN   QUEtiapine (SEROQUEL) 50 mg, Oral, Daily at bedtime   QUEtiapine (SEROQUEL) 300 mg, Oral, Daily at bedtime   traZODone (DESYREL) 100 mg, Oral, At bedtime PRN   venlafaxine XR (EFFEXOR XR) 75 mg, Oral, Daily   vitamin C 1,000 mg, Oral, Daily   zinc gluconate 50 mg, Oral, Daily    Patient Active Problem List   Diagnosis Date Noted   Status post total hip replacement, right 01/30/2022   Cervicalgia 07/13/2021   DDD (degenerative disc disease), lumbar 12/17/2018   Spinal stenosis of lumbar region 12/17/2018   Hypertension 03/31/2018   Low back pain with radiation 03/31/2018   History of vertigo 03/31/2018   Anxiety and depression 03/31/2018   Bipolar disorder ([place]) 03/31/2018      Review of Systems  All other systems reviewed and are negative.     Objective:     BP 102/78 (BP Location: Left Arm, Patient Position: Sitting, Cuff Size: Normal)   Pulse 80   Temp 98.6 F (37 C) (Oral)   Ht '5\' 9"'$  (1.753 m)   Wt 165 lb 8 oz (75.1 kg)   SpO2 98%   BMI 24.44 kg/m  Physical Exam Vitals reviewed.  Constitutional:      Appearance: Normal appearance. She is well-groomed and normal weight.  Eyes:     Conjunctiva/sclera: Conjunctivae normal.  Neck:     Thyroid: No thyromegaly.  Cardiovascular:     Rate and Rhythm: Normal rate and regular rhythm.     Pulses: Normal pulses.     Heart sounds: S1 normal and S2 normal.  Pulmonary:     Effort: Pulmonary effort is normal.     Breath sounds: Normal breath sounds and air entry.  Abdominal:     General: Bowel sounds are normal.  Musculoskeletal:     Right lower leg: No edema.     Left lower leg: No edema.  Neurological:     Mental Status: She is alert and oriented to person, place, and time. Mental status is at baseline.     Gait: Gait is  intact.  Psychiatric:        Mood and Affect: Mood and affect normal.        Speech: Speech normal.        Behavior: Behavior normal.        Judgment: Judgment normal.      No results found for any visits on 06/19/22.  Last metabolic panel Lab Results  Component Value Date   GLUCOSE 165 (H) 01/31/2022   NA 134 (L) 01/31/2022   K 3.7 01/31/2022   CL 101 01/31/2022   CO2 24 01/31/2022   BUN 14 01/31/2022   CREATININE 0.92 01/31/2022   GFRNONAA >60 01/31/2022   CALCIUM 8.7 (L) 01/31/2022   PROT 7.3 06/04/2021   ALBUMIN 4.7 06/04/2021   LABGLOB 2.5 08/18/2020   AGRATIO 2.0 08/18/2020   BILITOT 0.7 06/04/2021   ALKPHOS 89 06/04/2021   AST 21 06/04/2021   ALT 22 06/04/2021   ANIONGAP 9 01/31/2022      The 10-year ASCVD risk score (Arnett DK, et al., 2019) is: 2.3%    Assessment & Plan:   Problem List Items Addressed This Visit       Unprioritized   Low back pain with radiation    Chronic, ongoing, I reviewed the results of the MRI of lumbar spine from July 2023, it showed some mild degenerative disc disease and facet arthropathy, there is a narrowing at L4-5 which could be pressing on the left nerve root. I recommended referral to [facility] for further recommendations/ possible injections or procedures. Patient reports that the gabapentin is not effective for her pain.       Relevant Orders   Ambulatory referral to Pain Clinic   Anxiety and depression    I seeing providers for this, last visit was on 04/26/2022 for medication management, pt never picked up the effexor, I advised that we could send a new rx for cymbalta 60 mg daily now that she has new insurance, pt is agreeable to this dosing. If this medication is not covered then I instructed the patient to get the effexor 75 mg daily filled instead.       Relevant Medications   DULoxetine (CYMBALTA) 60 MG capsule   Other Visit Diagnoses     Colon cancer screening    -  Primary   Relevant Orders    Ambulatory referral to Gastroenterology   Breast cancer screening by mammogram       Relevant Orders   MM Digital Screening   Immunization due       Relevant Orders   Zoster Recombinant (  Shingrix ) (Completed)   Epidermal cyst of face       Relevant Orders   Patient has a soft, nontender cyst like structure on the right side of her chin that is causing a facial asymmetry. She denies any trauma to the area. I recommended a referral to plastics for evaluation and correction. Pt is agreeable to the referral.  Ambulatory referral to Plastic Surgery       Return in about 6 months (around 12/18/2022).    Farrel Conners, MD

## 2022-06-19 NOTE — Patient Instructions (Signed)
I sent a prescription for cymbalta 60 mg once daily. If this is too expensive or not covered, you can ask the pharmacist to fill the effexor 75 mg once daily that was called in on 04/26/2022.

## 2022-06-19 NOTE — Telephone Encounter (Signed)
Resent

## 2022-06-19 NOTE — Assessment & Plan Note (Signed)
Chronic, ongoing, I reviewed the results of the MRI of lumbar spine from July 2023, it showed some mild degenerative disc disease and facet arthropathy, there is a narrowing at L4-5 which could be pressing on the left nerve root. I recommended referral to [facility] for further recommendations/ possible injections or procedures. Patient reports that the gabapentin is not effective for her pain.

## 2022-06-21 ENCOUNTER — Other Ambulatory Visit: Payer: Self-pay

## 2022-06-21 MED ORDER — AMOXICILLIN 500 MG PO CAPS
2000.0000 mg | ORAL_CAPSULE | ORAL | 0 refills | Status: DC
  Filled 2022-06-21 – 2022-06-28 (×2): qty 16, 4d supply, fill #0

## 2022-06-27 ENCOUNTER — Other Ambulatory Visit: Payer: Self-pay

## 2022-06-28 ENCOUNTER — Other Ambulatory Visit: Payer: Self-pay

## 2022-07-03 ENCOUNTER — Encounter (HOSPITAL_COMMUNITY): Payer: Self-pay | Admitting: Psychiatry

## 2022-07-03 ENCOUNTER — Other Ambulatory Visit: Payer: Self-pay

## 2022-07-03 ENCOUNTER — Telehealth (INDEPENDENT_AMBULATORY_CARE_PROVIDER_SITE_OTHER): Payer: Medicaid Other | Admitting: Psychiatry

## 2022-07-03 DIAGNOSIS — F314 Bipolar disorder, current episode depressed, severe, without psychotic features: Secondary | ICD-10-CM | POA: Diagnosis not present

## 2022-07-03 DIAGNOSIS — F419 Anxiety disorder, unspecified: Secondary | ICD-10-CM

## 2022-07-03 DIAGNOSIS — F32A Depression, unspecified: Secondary | ICD-10-CM

## 2022-07-03 MED ORDER — QUETIAPINE FUMARATE 300 MG PO TABS
300.0000 mg | ORAL_TABLET | Freq: Every day | ORAL | 3 refills | Status: DC
  Filled 2022-07-03: qty 30, 30d supply, fill #0
  Filled 2022-09-09: qty 30, 30d supply, fill #1

## 2022-07-03 MED ORDER — TRAZODONE HCL 100 MG PO TABS
100.0000 mg | ORAL_TABLET | Freq: Every evening | ORAL | 3 refills | Status: DC | PRN
  Filled 2022-07-03: qty 30, 30d supply, fill #0

## 2022-07-03 MED ORDER — BUSPIRONE HCL 15 MG PO TABS
15.0000 mg | ORAL_TABLET | Freq: Three times a day (TID) | ORAL | 3 refills | Status: DC
  Filled 2022-07-03: qty 90, 30d supply, fill #0
  Filled 2022-09-09: qty 90, 30d supply, fill #1

## 2022-07-03 MED ORDER — HYDROXYZINE HCL 10 MG PO TABS
10.0000 mg | ORAL_TABLET | Freq: Three times a day (TID) | ORAL | 3 refills | Status: DC | PRN
  Filled 2022-07-03: qty 90, 30d supply, fill #0

## 2022-07-03 MED ORDER — QUETIAPINE FUMARATE 50 MG PO TABS
50.0000 mg | ORAL_TABLET | Freq: Every day | ORAL | 3 refills | Status: DC
  Filled 2022-07-03: qty 30, 30d supply, fill #0

## 2022-07-03 NOTE — Progress Notes (Signed)
BH MD/PA/NP OP Progress Note Virtual Visit via Video Note  I connected with Victoria Snyder on 07/03/22 at  8:30 AM EST by a video enabled telemedicine application and verified that I am speaking with the correct person using two identifiers.  Location: Patient: Home Provider: Clinic   I discussed the limitations of evaluation and management by telemedicine and the availability of in person appointments. The patient expressed understanding and agreed to proceed.  I provided 30 minutes of non-face-to-face time during this encounter.         07/03/2022 9:06 AM Victoria Snyder  MRN:  [id number]  Chief Complaint:  "I feel better"   HPI: 57 year old female seen today for follow up psychiatric evaluation. She has a  psychiatric history of Bipolar 2, anxiety, and depression. She is currently being managed on Seroquel 350 mg nighty, Trazodone 100 mg nightly, Effexor XR 75 mg,  Buspar 15 mg three times daily, and Hydroxyzine 10 mg three times daily as needed.  Patient informed Probation officer that her Cymbalta was recently restarted by her PCP at 60 mg and she no longer takes Effexor.  She reports that her other medications are effective in managing her psychiatric conditions.   Today she was well-groomed, pleasant, cooperative, engaged in conversation, and maintained eye contact.  She informed Probation officer that she feels better. She notes that she recently got insurance and has been able to go to specialist for her physical health. She notes that she has hip, back, and neck pain and has an appointment tomorrow at [place]. She quantifies her pains as 7/10.   Patient reports that her anxiety and depression has also improved due to left life stressors.  She informed Probation officer her living situation has improved. She reports that her female friend is now allowing her to stay in his home until she gets her disability and is able to afford her own home.  She also notes that she has been more persistent and taking her  medications and feels more mentally stable.  Today provider conducted a GAD-7 and patient scored a 13, at her last visit she scored a 21.  Provider also conducted PHQ-9 and patient scored a 17, at her last visit she scored a 23.  She endorses adequate sleep (6 hours) and appetite.  Patient reports that her hallucinations are not as frequent.  Today she denies SI/HI/VAH, mania, or paranoia.  Patient informed Probation officer that she has been working on completing task with her counselors.  She notes that now she uses a journal to stay organized.  No medication changes made today.  Patient will continue medications as prescribed.  Patient Cymbalta will be filled by her PCP.  She will follow-up with outpatient counseling for therapy.  No other concerns noted at this time.       Visit Diagnosis:    ICD-10-CM   1. Anxiety and depression  F41.9 traZODone (DESYREL) 100 MG tablet   F32.A QUEtiapine (SEROQUEL) 300 MG tablet    QUEtiapine (SEROQUEL) 50 MG tablet    hydrOXYzine (ATARAX) 10 MG tablet    busPIRone (BUSPAR) 15 MG tablet    2. Bipolar disorder, current episode depressed, severe, without psychotic features ([place])  F31.4 traZODone (DESYREL) 100 MG tablet    QUEtiapine (SEROQUEL) 300 MG tablet    QUEtiapine (SEROQUEL) 50 MG tablet      Past Psychiatric History: anxiety, depression, insomnia, and bipolar 2 disorder. Past Medical History:  Past Medical History:  Diagnosis Date   Anxiety  Arthritis    Bipolar disorder ([place])    Blood transfusion without reported diagnosis    Hypertension    Hypokalemia    Low back pain with radiation    Vertigo     Past Surgical History:  Procedure Laterality Date   ABDOMINAL HYSTERECTOMY     partial for fibroids   EXPLORATORY LAPAROTOMY  2011   mass in abdomen removed-uterine leiomyomata   TOTAL HIP ARTHROPLASTY Right 01/30/2022   Procedure: RIGHT TOTAL HIP ARTHROPLASTY ANTERIOR APPROACH;  Surgeon: [name], MD;  Location: [facility];  Service:  Orthopedics;  Laterality: Right;    Family Psychiatric History: Notes that her half brother had a mental illness however she is unaware of what it was.     Family History:  Family History  Problem Relation Age of Onset   Heart disease Mother        heart attack   Stroke Father    Breast cancer Neg Hx     Social History:  Social History   Socioeconomic History   Marital status: Single    Spouse name: Not on file   Number of children: 1   Years of education: Not on file   Highest education level: 12th grade  Occupational History   Occupation: Unemployed  Tobacco Use   Smoking status: Former    Types: Cigarettes    Quit date: 2001    Years since quitting: 23.1   Smokeless tobacco: Never  Vaping Use   Vaping Use: Never used  Substance and Sexual Activity   Alcohol use: Yes    Comment: Drinks occasionally   Drug use: No   Sexual activity: Yes    Birth control/protection: Surgical  Other Topics Concern   Not on file  Social History Narrative   Separated.  1 child   Not working.    Social Determinants of Health   Financial Resource Strain: Not on file  Food Insecurity: No Food Insecurity (05/03/2021)   Hunger Vital Sign    Worried About Running Out of Food in the Last Year: Never true    Ran Out of Food in the Last Year: Never true  Transportation Needs: No Transportation Needs (05/03/2021)   PRAPARE - Hydrologist (Medical): No    Lack of Transportation (Non-Medical): No  Physical Activity: Not on file  Stress: Not on file  Social Connections: Not on file    Allergies: No Known Allergies  Metabolic Disorder Labs: Lab Results  Component Value Date   HGBA1C 5.2 06/06/2021   No results found for: "PROLACTIN" Lab Results  Component Value Date   CHOL 193 06/04/2021   TRIG 118.0 06/04/2021   HDL 64.60 06/04/2021   CHOLHDL 3 06/04/2021   VLDL 23.6 06/04/2021   LDLCALC 105 (H) 06/04/2021   LDLCALC 94 08/18/2020   Lab Results   Component Value Date   TSH 1.04 06/04/2021   TSH 1.840 10/22/2017    Therapeutic Level Labs: No results found for: "LITHIUM" No results found for: "VALPROATE" No results found for: "CBMZ"  Current Medications: Current Outpatient Medications  Medication Sig Dispense Refill   amLODipine (NORVASC) 5 MG tablet Take 1 tablet (5 mg total) by mouth daily. 90 tablet 3   amoxicillin (AMOXIL) 500 MG capsule Take 4 capsules (2,000 mg total) by mouth 1 hour prior to appointment. 16 capsule 0   Ascorbic Acid (VITAMIN C) 1000 MG tablet Take 1,000 mg by mouth daily.     aspirin EC  325 MG tablet Take 1 tablet (325 mg total) by mouth daily. MUST TAKE AT LEAST 4 WEEKS POSTOP FOR DVT PROPHYLAXIS 30 tablet 0   busPIRone (BUSPAR) 15 MG tablet Take 1 tablet (15 mg total) by mouth 3 (three) times daily. 90 tablet 3   DULoxetine (CYMBALTA) 60 MG capsule Take 1 capsule (60 mg total) by mouth daily. 90 capsule 1   gabapentin (NEURONTIN) 400 MG capsule Take 1 capsule (400 mg total) by mouth 3 (three) times daily. To help with back pain with radiation 90 capsule 3   hydrOXYzine (ATARAX) 10 MG tablet Take 1 tablet (10 mg total) by mouth 3 (three) times daily as needed for anxiety. 90 tablet 3   meclizine (ANTIVERT) 25 MG tablet Take 1 tablet (25 mg total) by mouth 3 (three) times daily as needed for dizziness. 30 tablet 3   methocarbamol (ROBAXIN) 500 MG tablet Take 1 tablet (500 mg total) by mouth every 6 (six) hours as needed for muscle spasms. 60 tablet 0   Multiple Vitamins-Minerals (MULTIVITAMIN WITH MINERALS) tablet Take 1 tablet by mouth daily.     oxyCODONE-acetaminophen (PERCOCET) 5-325 MG tablet Take 1-2 tablets by mouth every 6 (six) hours as needed for severe pain. 40 tablet 0   QUEtiapine (SEROQUEL) 300 MG tablet Take 1 tablet (300 mg total) by mouth at bedtime. 30 tablet 3   QUEtiapine (SEROQUEL) 50 MG tablet Take 1 tablet (50 mg total) by mouth at bedtime. 30 tablet 3   traZODone (DESYREL) 100 MG  tablet Take 1 tablet (100 mg total) by mouth at bedtime as needed for sleep. 30 tablet 3   zinc gluconate 50 MG tablet Take 50 mg by mouth daily.     No current facility-administered medications for this visit.     Musculoskeletal: Strength & Muscle Tone: within normal limits and telehealth visit Gait & Station: normal, telehealth visit Patient leans: N/A  Psychiatric Specialty Exam: Review of Systems  There were no vitals taken for this visit.There is no height or weight on file to calculate BMI.  General Appearance: Well Groomed  Eye Contact:  Good  Speech:  Clear and Coherent and Normal Rate  Volume:  Normal  Mood:  Anxious and Depressed  Affect:  Congruent  Thought Process:  Coherent, Goal Directed and Linear  Orientation:  Full (Time, Place, and Person)  Thought Content: WDL and Logical   Suicidal Thoughts:  Yes.  without intent/plan  Homicidal Thoughts:  No  Memory:  Immediate;   Good Recent;   Good Remote;   Good  Judgement:  Good  Insight:  Good  Psychomotor Activity:  Normal  Concentration:  Concentration: Good and Attention Span: Good  Recall:  Good  Fund of Knowledge: Good  Language: Good  Akathisia:  No  Handed:  Right  AIMS (if indicated): Not done  Assets:  Communication Skills Desire for Improvement Financial Resources/Insurance Housing Social Support  ADL's:  Intact  Cognition: WNL  Sleep:  Fair   Screenings: GAD-7    Flowsheet Row Video Visit from 07/03/2022 in [facility] Video Visit from 04/26/2022 in [facility] Video Visit from 02/18/2022 in [facility] Counselor from 02/15/2022 in [facility] Video Visit from 11/15/2021 in [facility]  Total GAD-7 Score '13 21 14 11 18      '$ PHQ2-9    Flowsheet Row Video Visit from 07/03/2022 in [facility] Office Visit  from 06/19/2022 in [facility] Video Visit from 04/26/2022 in [facility] Video Visit from 02/18/2022 in [facility] Counselor from 02/15/2022 in [place]  PHQ-2 Total Score '4 5 5 4 2  '$ PHQ-9 Total Score '17 22 23 17 10      '$ Flowsheet Row Video Visit from 07/03/2022 in [facility] Video Visit from 04/26/2022 in [facility] Video Visit from 02/18/2022 in [place] Error: Q7 should not be populated when Q6 is No Error: Q7 should not be populated when Q6 is No Error: Q7 should not be populated when Q6 is No        Assessment and Plan: Patient reports that her anxiety, depression, and sleep has improved since her last visit.  She also notes that she is hallucinating less frequently reports that overall her mood is stable.No medication changes made today.  Patient will continue medications as prescribed.  Patient Cymbalta will be filled by her PC  1. Anxiety and depression  Continue- traZODone (DESYREL) 100 MG tablet; Take 1 tablet (100 mg total) by mouth at bedtime as needed for sleep.  Dispense: 30 tablet; Refill: 3 Continue- QUEtiapine (SEROQUEL) 300 MG tablet; Take 1 tablet (300 mg total) by mouth at bedtime.  Dispense: 30 tablet; Refill: 3 Continue- QUEtiapine (SEROQUEL) 50 MG tablet; Take 1 tablet (50 mg total) by mouth at bedtime.  Dispense: 30 tablet; Refill: 3 Continue- hydrOXYzine (ATARAX) 10 MG tablet; Take 1 tablet (10 mg total) by mouth 3 (three) times daily as needed for anxiety.  Dispense: 90 tablet; Refill: 3 Continue- busPIRone (BUSPAR) 15 MG tablet; Take 1 tablet (15 mg total) by mouth 3 (three) times daily.  Dispense: 90 tablet; Refill: 3  2. Bipolar disorder, current episode depressed, severe, without psychotic features ([employer])  Continue- traZODone (DESYREL) 100 MG tablet; Take 1  tablet (100 mg total) by mouth at bedtime as needed for sleep.  Dispense: 30 tablet; Refill: 3 Continue- QUEtiapine (SEROQUEL) 300 MG tablet; Take 1 tablet (300 mg total) by mouth at bedtime.  Dispense: 30 tablet; Refill: 3 Continue- QUEtiapine (SEROQUEL) 50 MG tablet; Take 1 tablet (50 mg total) by mouth at bedtime.  Dispense: 30 tablet; Refill: 3    Follow-up in 3 months Follow-up with therapy   Salley Slaughter, NP 07/03/2022, 9:06 AM

## 2022-07-04 DIAGNOSIS — M5451 Vertebrogenic low back pain: Secondary | ICD-10-CM | POA: Diagnosis not present

## 2022-07-04 DIAGNOSIS — G8929 Other chronic pain: Secondary | ICD-10-CM | POA: Diagnosis not present

## 2022-07-04 DIAGNOSIS — M5116 Intervertebral disc disorders with radiculopathy, lumbar region: Secondary | ICD-10-CM | POA: Diagnosis not present

## 2022-07-04 DIAGNOSIS — M5416 Radiculopathy, lumbar region: Secondary | ICD-10-CM | POA: Diagnosis not present

## 2022-07-08 ENCOUNTER — Other Ambulatory Visit: Payer: Self-pay

## 2022-07-17 ENCOUNTER — Institutional Professional Consult (permissible substitution): Payer: 59 | Admitting: Plastic Surgery

## 2022-07-18 DIAGNOSIS — M5416 Radiculopathy, lumbar region: Secondary | ICD-10-CM | POA: Diagnosis not present

## 2022-07-24 ENCOUNTER — Ambulatory Visit (INDEPENDENT_AMBULATORY_CARE_PROVIDER_SITE_OTHER): Payer: 59 | Admitting: Plastic Surgery

## 2022-07-24 ENCOUNTER — Encounter: Payer: Self-pay | Admitting: Plastic Surgery

## 2022-07-24 VITALS — BP 135/84 | HR 105 | Ht 69.0 in | Wt 163.0 lb

## 2022-07-24 DIAGNOSIS — R220 Localized swelling, mass and lump, head: Secondary | ICD-10-CM | POA: Diagnosis not present

## 2022-07-24 DIAGNOSIS — D489 Neoplasm of uncertain behavior, unspecified: Secondary | ICD-10-CM

## 2022-07-24 NOTE — Progress Notes (Signed)
Referring Provider Farrel Conners, MD [place],  [place] [address]   CC:  Chief Complaint  Patient presents with   Consult      MYLEEN FINFROCK is an 57 y.o. female.  HPI: Ms. Zborowski presents today for evaluation of a mass on the right side of her chin.  She states it has been there for quite some time but is not exactly sure how long.  It has been growing.  She has been seen by dermatologist who referred her for removal of the mass.  No Known Allergies  Outpatient Encounter Medications as of 07/24/2022  Medication Sig   amLODipine (NORVASC) 5 MG tablet Take 1 tablet (5 mg total) by mouth daily.   amoxicillin (AMOXIL) 500 MG capsule Take 4 capsules (2,000 mg total) by mouth 1 hour prior to appointment.   Ascorbic Acid (VITAMIN C) 1000 MG tablet Take 1,000 mg by mouth daily.   aspirin EC 325 MG tablet Take 1 tablet (325 mg total) by mouth daily. MUST TAKE AT LEAST 4 WEEKS POSTOP FOR DVT PROPHYLAXIS   busPIRone (BUSPAR) 15 MG tablet Take 1 tablet (15 mg total) by mouth 3 (three) times daily.   DULoxetine (CYMBALTA) 60 MG capsule Take 1 capsule (60 mg total) by mouth daily.   gabapentin (NEURONTIN) 400 MG capsule Take 1 capsule (400 mg total) by mouth 3 (three) times daily. To help with back pain with radiation   hydrOXYzine (ATARAX) 10 MG tablet Take 1 tablet (10 mg total) by mouth 3 (three) times daily as needed for anxiety.   meclizine (ANTIVERT) 25 MG tablet Take 1 tablet (25 mg total) by mouth 3 (three) times daily as needed for dizziness.   methocarbamol (ROBAXIN) 500 MG tablet Take 1 tablet (500 mg total) by mouth every 6 (six) hours as needed for muscle spasms.   Multiple Vitamins-Minerals (MULTIVITAMIN WITH MINERALS) tablet Take 1 tablet by mouth daily.   oxyCODONE-acetaminophen (PERCOCET) 5-325 MG tablet Take 1-2 tablets by mouth every 6 (six) hours as needed for severe pain.   QUEtiapine (SEROQUEL) 300 MG tablet Take 1 tablet (300 mg total) by mouth at  bedtime.   QUEtiapine (SEROQUEL) 50 MG tablet Take 1 tablet (50 mg total) by mouth at bedtime.   traZODone (DESYREL) 100 MG tablet Take 1 tablet (100 mg total) by mouth at bedtime as needed for sleep.   zinc gluconate 50 MG tablet Take 50 mg by mouth daily.   No facility-administered encounter medications on file as of 07/24/2022.     Past Medical History:  Diagnosis Date   Anxiety    Arthritis    Bipolar disorder ([place])    Blood transfusion without reported diagnosis    Hypertension    Hypokalemia    Low back pain with radiation    Vertigo     Past Surgical History:  Procedure Laterality Date   ABDOMINAL HYSTERECTOMY     partial for fibroids   EXPLORATORY LAPAROTOMY  2011   mass in abdomen removed-uterine leiomyomata   TOTAL HIP ARTHROPLASTY Right 01/30/2022   Procedure: RIGHT TOTAL HIP ARTHROPLASTY ANTERIOR APPROACH;  Surgeon: [name], MD;  Location: [facility];  Service: Orthopedics;  Laterality: Right;    Family History  Problem Relation Age of Onset   Heart disease Mother        heart attack   Stroke Father    Breast cancer Neg Hx     Social History   Social History Narrative  Separated.  1 child   Not working.      Review of Systems General: Denies fevers, chills, weight loss CV: Denies chest pain, shortness of breath, palpitations Skin: Small mass at the border of the chin on the right side.  Denies any pain or drainage or erythema.  Physical Exam    07/24/2022    8:23 AM 06/19/2022    8:01 AM 04/23/2022    9:03 AM  Vitals with BMI  Height '5\' 9"'$  '5\' 9"'$  '5\' 9"'$   Weight 163 lbs 165 lbs 8 oz 166 lbs  BMI 24.06 [id number]  Systolic [id number]  Diastolic 84 78 80  Pulse [id number] 80 87    General:  No acute distress,  Alert and oriented, Non-Toxic, Normal speech and affect Integument: A 1-1/2 to 2 cm mass at the border of the chin on the right side.  The mass is soft but I cannot tell if it is fixed to the underlying structures.  It does not appear to be a  sebaceous cyst but is more consistent with a lipoma. Mammogram: No mammograms documented since 2022 Assessment/Plan Soft tissue mass, face: Discussed the mass and removal with the patient.  She understands that it will not be possible to remove the mass without there being a scar of some type.  Also the fact that the mass does not appear to be a sebaceous cyst and may be a lipoma puts the branches of the mental nerve at risk.  We discussed the possibility of numbness of the lip postoperatively.  She understands all of this and request that I proceed.  Will schedule for removal in the office.  Camillia Herter 07/24/2022, 8:34 AM

## 2022-07-31 ENCOUNTER — Encounter: Payer: 59 | Admitting: Physician Assistant

## 2022-08-02 ENCOUNTER — Encounter: Payer: Self-pay | Admitting: Physician Assistant

## 2022-08-15 ENCOUNTER — Other Ambulatory Visit: Payer: Self-pay

## 2022-08-21 ENCOUNTER — Ambulatory Visit: Payer: Commercial Managed Care - HMO | Admitting: Orthopaedic Surgery

## 2022-08-22 ENCOUNTER — Ambulatory Visit (INDEPENDENT_AMBULATORY_CARE_PROVIDER_SITE_OTHER): Payer: 59 | Admitting: Plastic Surgery

## 2022-08-22 ENCOUNTER — Encounter: Payer: Self-pay | Admitting: Plastic Surgery

## 2022-08-22 VITALS — BP 142/90 | HR 98 | Ht 69.0 in | Wt 156.0 lb

## 2022-08-22 DIAGNOSIS — D170 Benign lipomatous neoplasm of skin and subcutaneous tissue of head, face and neck: Secondary | ICD-10-CM | POA: Diagnosis not present

## 2022-08-22 DIAGNOSIS — D489 Neoplasm of uncertain behavior, unspecified: Secondary | ICD-10-CM

## 2022-08-22 NOTE — Progress Notes (Signed)
Procedure Note  Preoperative Dx: Subcutaneous mass, chin  Postoperative Dx: Same  Procedure: Excision of mass   Anesthesia: Lidocaine 1% with 1:100,000 epinephrine and 0.25% Sensorcaine   Indication for Procedure: Removal for pathologic diagnosis for slowly growing mass  Description of Procedure: Risks and complications were explained to the patient including recurrence.  Consent was confirmed and the patient understands the risks and benefits.  The potential complications and alternatives were explained and the patient consents.  The patient expressed understanding the option of not having the procedure and the risks of a scar.  Time out was called and all information was confirmed to be correct.    The area was prepped and drapped.  Local anesthetic was injected in the subcutaneous tissues.  After waiting for the local to take affect a 1.5 cm incision was made on the inferior border of the chin at the lateral border of the mass.  A combination of sharp and blunt dissection was used to isolate the mass which was removed in 1 piece..  After obtaining hemostasis, the surgical wound was closed with interrupted 4-0 Monocryl in the dermis and interrupted 5-0 Prolene sutures and the skin.  The surgical wound measured 1.5 cm.  A dressing was applied.  The patient was given instructions on how to care for the area and a follow up appointment.  Alicianna tolerated the procedure well and there were no complications. The specimen was sent to pathology.

## 2022-08-23 ENCOUNTER — Telehealth: Payer: Self-pay | Admitting: Plastic Surgery

## 2022-08-23 NOTE — Telephone Encounter (Signed)
Vena, Capello [id number] pt is on phone asking if she can use an ice pack on face she had PX done yesterday, Joss responded yes after speaking to both Merry Proud and Derry PAs here.

## 2022-08-27 ENCOUNTER — Encounter: Payer: Self-pay | Admitting: Physician Assistant

## 2022-08-27 ENCOUNTER — Ambulatory Visit (INDEPENDENT_AMBULATORY_CARE_PROVIDER_SITE_OTHER): Payer: 59 | Admitting: Orthopaedic Surgery

## 2022-08-27 ENCOUNTER — Ambulatory Visit (INDEPENDENT_AMBULATORY_CARE_PROVIDER_SITE_OTHER): Payer: 59 | Admitting: Physician Assistant

## 2022-08-27 ENCOUNTER — Ambulatory Visit: Payer: Commercial Managed Care - HMO | Admitting: Orthopaedic Surgery

## 2022-08-27 ENCOUNTER — Other Ambulatory Visit: Payer: Self-pay

## 2022-08-27 ENCOUNTER — Encounter: Payer: Self-pay | Admitting: Orthopaedic Surgery

## 2022-08-27 VITALS — BP 114/75 | HR 72 | Ht 69.0 in | Wt 163.0 lb

## 2022-08-27 VITALS — BP 131/88 | HR 76

## 2022-08-27 DIAGNOSIS — D170 Benign lipomatous neoplasm of skin and subcutaneous tissue of head, face and neck: Secondary | ICD-10-CM

## 2022-08-27 DIAGNOSIS — Z96641 Presence of right artificial hip joint: Secondary | ICD-10-CM | POA: Diagnosis not present

## 2022-08-27 MED ORDER — CEPHALEXIN 500 MG PO CAPS
500.0000 mg | ORAL_CAPSULE | Freq: Three times a day (TID) | ORAL | 0 refills | Status: DC
  Filled 2022-08-27: qty 15, 5d supply, fill #0

## 2022-08-27 NOTE — Progress Notes (Signed)
Patient is a pleasant 57 year old female with PMH of chin mass s/p excision performed in office on 08/22/2022 by Dr. Lovena Le who presents to clinic for postprocedural follow-up.  Reviewed procedure note and the excision site was closed with deep Monocryl and 5-0 Prolene interrupted sutures for the skin.  Pathology was consistent with lipoma, negative for malignancy or atypia.  Today, patient is doing well.  She states that the area is mildly swollen as well as mildly tender.  Otherwise, no drainage or other complaints.  On exam, she does have some localized swelling at the site of her excision.  Skin edges appear well-approximated.  Area is mildly tender.  A total of four 5-0 Prolene sutures are removed without complication or difficulty.  No bleeding.  Well-tolerated by patient.  No erythema or obvious cellulitic changes.  Skin is not indurated.  While there is no obvious infection, given that patient does have an area of localized swelling at the site of her incision in addition to mild tenderness, will cover her with Keflex x 5 days.  No obvious fluctuance or seroma.  Suspect inflammation.  Recommending gentle mechanical massage regularly in addition to NSAIDs.  Suspect that it will improve.  If it is still bothersome after 1 week, encouraged her to follow-up with Dr. Lovena Le for reevaluation.  Discussed use of silicone scar gels, as well.  Picture(s) obtained of the patient and placed in the chart were with the patient's or guardian's permission.

## 2022-08-27 NOTE — Progress Notes (Signed)
Office Visit Note   Patient: Victoria Snyder           Date of Birth: [birth year]           MRN: [id number] Visit Date: 08/27/2022              Requested by: Tawnya Crook, MD [place],  [place] [address] PCP: Farrel Conners, MD   Assessment & Plan: Visit Diagnoses:  1. Status post total hip replacement, right     Plan: Return in 6 months single standing AP x-ray of pelvis to see both hips on return.  Follow-Up Instructions: Return in about 6 months (around 02/26/2023).   Orders:  No orders of the defined types were placed in this encounter.  No orders of the defined types were placed in this encounter.     Procedures: No procedures performed   Clinical Data: No additional findings.   Subjective: Chief Complaint  Patient presents with   Right Hip - Follow-up    01/30/2022 Right THA    HPI follow-up right total hip arthroplasty direct anterior approach done September 2023.  She had some widening of the scar with keloid that is not raised.  Some tight close tend to bother her and she had some swelling which is decreasing every month.  She is walking without limping happy with the results of pain relief from her hip.  Opposite hips not bothering her but she does have osteoarthritis in the right total hip arthroplasty.  Review of Systems all systems noncontributory to HPI.   Objective: Vital Signs: BP 114/75   Pulse 72   Ht 5\' 9"  (1.753 m)   Wt 163 lb (73.9 kg)   BMI 24.07 kg/m   Physical Exam Constitutional:      Appearance: She is well-developed.  HENT:     Head: Normocephalic.     Right Ear: External ear normal.     Left Ear: External ear normal. There is no impacted cerumen.  Eyes:     Pupils: Pupils are equal, round, and reactive to light.  Neck:     Thyroid: No thyromegaly.     Trachea: No tracheal deviation.  Cardiovascular:     Rate and Rhythm: Normal rate.  Pulmonary:     Effort: Pulmonary effort is normal.  Abdominal:      Palpations: Abdomen is soft.  Musculoskeletal:     Cervical back: No rigidity.  Skin:    General: Skin is warm and dry.  Neurological:     Mental Status: She is alert and oriented to person, place, and time.  Psychiatric:        Behavior: Behavior normal.     Ortho Exam patient is amatory without limp.  Scar is widened slightly more than a centimeter not raised.  Leg lengths are equal.  No pain with internal/external rotation opposite left hip.  Specialty Comments:  No specialty comments available.  Imaging: No results found.   PMFS History: Patient Active Problem List   Diagnosis Date Noted   Status post total hip replacement, right 01/30/2022   Cervicalgia 07/13/2021   DDD (degenerative disc disease), lumbar 12/17/2018   Spinal stenosis of lumbar region 12/17/2018   Hypertension 03/31/2018   Low back pain with radiation 03/31/2018   History of vertigo 03/31/2018   Anxiety and depression 03/31/2018   Bipolar disorder 03/31/2018   Past Medical History:  Diagnosis Date   Anxiety    Arthritis    Bipolar disorder  Blood transfusion without reported diagnosis    Hypertension    Hypokalemia    Low back pain with radiation    Vertigo     Family History  Problem Relation Age of Onset   Heart disease Mother        heart attack   Stroke Father    Breast cancer Neg Hx     Past Surgical History:  Procedure Laterality Date   ABDOMINAL HYSTERECTOMY     partial for fibroids   EXPLORATORY LAPAROTOMY  2011   mass in abdomen removed-uterine leiomyomata   TOTAL HIP ARTHROPLASTY Right 01/30/2022   Procedure: RIGHT TOTAL HIP ARTHROPLASTY ANTERIOR APPROACH;  Surgeon: [name], MD;  Location: [facility];  Service: Orthopedics;  Laterality: Right;   Social History   Occupational History   Occupation: Unemployed  Tobacco Use   Smoking status: Former    Types: Cigarettes    Quit date: 2001    Years since quitting: 23.2   Smokeless tobacco: Never  Vaping Use    Vaping Use: Never used  Substance and Sexual Activity   Alcohol use: Yes    Comment: Drinks occasionally   Drug use: No   Sexual activity: Yes    Birth control/protection: Surgical

## 2022-08-28 NOTE — Progress Notes (Signed)
09/04/2022 Victoria Snyder [id number] 28-Jan-1966  Referring provider: Karie Georges, MD Primary GI doctor: Victoria Snyder  ASSESSMENT AND PLAN:   Chronic idiopathic constipation/IBS-C Has been for years, no alarm symptoms, overdue for colonoscopy May need to day prep pending response to linzess, given instruction for 2 day prep - linzess samples given 290 mcg - instructions how to take given to patient in AVS - lifestyle changes discussed   Screen for colon cancer We have discussed the risks of bleeding, infection, perforation, medication reactions, and remote risk of death associated with colonoscopy. All questions were answered and the patient acknowledges these risk and wishes to proceed.  Hemorrhoids -Sitz baths, increase fiber, increase water - follow up for evaluation here in the office after colonoscopy.    Patient Care Team: Victoria Georges, MD as PCP - General (Family Medicine)  HISTORY OF PRESENT ILLNESS: 57 y.o. female with a past medical history of hyperlipidemia, hypertension, anxiety, depression, bipolar disorder and others listed below presents for evaluation of constipation.   Patient denies family history of colon cancer or other gastrointestinal malignancies. Patient without history of anemia, but developed post surgical anemia on 01/31/2022 Hgb 12.3, HCT 33.6, MCV 89.6 after right THR with Dr. Ophelia Charter History of fecal disimpaction 2015 She states she has had constipation for 10-15 years or longer.  Better with movement, wants to join the gym. She has gotten fiber supplement from [employer].  Has tried miralax and stool softener without help.  She has BM once a week. She has bloating, occ she gets really constipated and has back pain and has to take a laxative.  Rare BRB on TP occ, no associated rectal pain. Happens every 3-6 months, has not happened recently.  Denies changes in appetite, unintentional weight loss.  Patient denies GERD.  Patient denies  dysphagia, nausea, vomiting, melena.   She denies tobacco use, 1-2 x a week she will drink wine, no drug use.  She denies NSAIDS and states she is no long on her oxycodone for her hip.    She  reports that she quit smoking about 23 years ago. Her smoking use included cigarettes. She has never used smokeless tobacco. She reports current alcohol use. She reports that she does not use drugs.  RELEVANT LABS AND IMAGING: CBC    Component Value Date/Time   WBC 7.8 01/31/2022 1101   RBC 3.75 (L) 01/31/2022 1101   HGB 12.3 01/31/2022 1101   HGB 15.3 08/18/2020 1133   HCT 33.6 (L) 01/31/2022 1101   HCT 43.4 08/18/2020 1133   PLT 242 01/31/2022 1101   PLT 297 08/18/2020 1133   MCV 89.6 01/31/2022 1101   MCV 91 08/18/2020 1133   MCH 32.8 01/31/2022 1101   MCHC 36.6 (H) 01/31/2022 1101   RDW 12.0 01/31/2022 1101   RDW 11.6 (L) 08/18/2020 1133   LYMPHSABS 1.2 09/25/2013 1437   MONOABS 0.6 09/25/2013 1437   EOSABS 0.1 09/25/2013 1437   BASOSABS 0.0 09/25/2013 1437   Recent Labs    01/24/22 0858 01/31/22 1101  HGB 15.0 12.3     CMP     Component Value Date/Time   NA 134 (L) 01/31/2022 1101   NA 141 08/18/2020 1133   K 3.7 01/31/2022 1101   CL 101 01/31/2022 1101   CO2 24 01/31/2022 1101   GLUCOSE 165 (H) 01/31/2022 1101   BUN 14 01/31/2022 1101   BUN 12 08/18/2020 1133   CREATININE 0.92 01/31/2022 1101   CALCIUM  8.7 (L) 01/31/2022 1101   PROT 7.3 06/04/2021 1409   PROT 7.4 08/18/2020 1133   ALBUMIN 4.7 06/04/2021 1409   ALBUMIN 4.9 08/18/2020 1133   AST 21 06/04/2021 1409   ALT 22 06/04/2021 1409   ALKPHOS 89 06/04/2021 1409   BILITOT 0.7 06/04/2021 1409   BILITOT 0.7 08/18/2020 1133   GFRNONAA >60 01/31/2022 1101   GFRAA 86 06/09/2019 0938      Latest Ref Rng & Units 06/04/2021    2:09 PM 08/18/2020   11:33 AM 10/22/2017   10:53 AM  Hepatic Function  Total Protein 6.0 - 8.3 g/dL 7.3  7.4  7.6   Albumin 3.5 - 5.2 g/dL 4.7  4.9  4.9   AST 0 - 37 U/L 21  18  20     ALT 0 - 35 U/L 22  20  20    Alk Phosphatase 39 - 117 U/L 89  90  87   Total Bilirubin 0.2 - 1.2 mg/dL 0.7  0.7  0.6       Current Medications:    Current Outpatient Medications (Cardiovascular):    amLODipine (NORVASC) 5 MG tablet, Take 1 tablet (5 mg total) by mouth daily.   Current Outpatient Medications (Analgesics):    oxyCODONE-acetaminophen (PERCOCET) 5-325 MG tablet, Take 1-2 tablets by mouth every 6 (six) hours as needed for severe pain.   Current Outpatient Medications (Other):    Ascorbic Acid (VITAMIN C) 1000 MG tablet, Take 1,000 mg by mouth daily.   busPIRone (BUSPAR) 15 MG tablet, Take 1 tablet (15 mg total) by mouth 3 (three) times daily.   DULoxetine (CYMBALTA) 60 MG capsule, Take 1 capsule (60 mg total) by mouth daily.   gabapentin (NEURONTIN) 400 MG capsule, Take 1 capsule (400 mg total) by mouth 3 (three) times daily. To help with back pain with radiation   hydrOXYzine (ATARAX) 10 MG tablet, Take 1 tablet (10 mg total) by mouth 3 (three) times daily as needed for anxiety.   Multiple Vitamins-Minerals (MULTIVITAMIN WITH MINERALS) tablet, Take 1 tablet by mouth daily.   QUEtiapine (SEROQUEL) 300 MG tablet, Take 1 tablet (300 mg total) by mouth at bedtime.   traZODone (DESYREL) 100 MG tablet, Take 1 tablet (100 mg total) by mouth at bedtime as needed for sleep.   zinc gluconate 50 MG tablet, Take 50 mg by mouth daily.  Medical History:  Past Medical History:  Diagnosis Date   Anxiety    Arthritis    Bipolar disorder    Blood transfusion without reported diagnosis    Hypertension    Hypokalemia    Low back pain with radiation    Vertigo    Allergies: No Known Allergies   Surgical History:  She  has a past surgical history that includes Abdominal hysterectomy; Exploratory laparotomy (2011); and Total hip arthroplasty (Right, 01/30/2022). Family History:  Her family history includes Heart disease in her mother; Stroke in her father.  REVIEW OF SYSTEMS  :  All other systems reviewed and negative except where noted in the History of Present Illness.  PHYSICAL EXAM: BP 110/70   Pulse 73   Ht 5\' 9"  (1.753 m)   Wt 162 lb (73.5 kg)   BMI 23.92 kg/m  General Appearance: Well nourished, in no apparent distress. Head:   Normocephalic and atraumatic. Eyes:  sclerae anicteric,conjunctive pink  Respiratory: Respiratory effort normal, BS equal bilaterally without rales, rhonchi, wheezing. Cardio: RRR with no MRGs. Peripheral pulses intact.  Abdomen: Soft,  Flat ,active bowel  sounds. No tenderness . Without guarding and Without rebound. No masses. Rectal: Not evaluated Musculoskeletal: Full ROM, Normal gait. Without edema. Skin:  Dry and intact without significant lesions or rashes Neuro: Alert and  oriented x4;  No focal deficits. Psych:  Cooperative. Normal mood and affect.    Doree AlbeeAmanda R Miyoko Hashimi, PA-C 9:01 AM

## 2022-08-29 ENCOUNTER — Other Ambulatory Visit: Payer: Self-pay

## 2022-08-29 DIAGNOSIS — M5451 Vertebrogenic low back pain: Secondary | ICD-10-CM | POA: Diagnosis not present

## 2022-08-29 DIAGNOSIS — M5416 Radiculopathy, lumbar region: Secondary | ICD-10-CM | POA: Diagnosis not present

## 2022-08-29 DIAGNOSIS — M5116 Intervertebral disc disorders with radiculopathy, lumbar region: Secondary | ICD-10-CM | POA: Diagnosis not present

## 2022-08-29 DIAGNOSIS — G8929 Other chronic pain: Secondary | ICD-10-CM | POA: Diagnosis not present

## 2022-08-30 ENCOUNTER — Ambulatory Visit
Admission: RE | Admit: 2022-08-30 | Discharge: 2022-08-30 | Disposition: A | Discharge status: Home / Self Care | Payer: 59 | Source: Ambulatory Visit | Attending: Family Medicine | Admitting: Family Medicine

## 2022-08-30 DIAGNOSIS — Z1231 Encounter for screening mammogram for malignant neoplasm of breast: Secondary | ICD-10-CM | POA: Diagnosis not present

## 2022-09-03 ENCOUNTER — Ambulatory Visit (INDEPENDENT_AMBULATORY_CARE_PROVIDER_SITE_OTHER): Payer: 59 | Admitting: Physician Assistant

## 2022-09-03 DIAGNOSIS — D170 Benign lipomatous neoplasm of skin and subcutaneous tissue of head, face and neck: Secondary | ICD-10-CM

## 2022-09-03 NOTE — Progress Notes (Signed)
Patient is a pleasant 57 year old female with PMH of chin mass s/p excision performed in office 08/22/2022 by Dr. Ladona Ridgel who presents to clinic for postprocedural follow-up.    She was last seen here in clinic on 08/27/2022.  At that time, skin edges were well-approximated and the Prolene sutures were removed without complication or difficulty.  However, there was an area of localized swelling at the site of her excision that was tender to palpation.  No erythema or other obvious cellulitic changes.  No fluctuance otherwise concerning for abscess.  Suspected inflammation, but Keflex x 5 days was ordered to cover for possible early infection.  Recommended gentle massage regularly in addition to NSAIDs.  Advised to follow-up in 1 week with Dr. Ladona Ridgel if it continue to be bothersome and swollen.  Today, patient is feeling improved.  She states that she has been taking the antibiotics, as prescribed.  She also reports that she has been regularly performing mechanical massage to the chin.  She states that the area is no longer tender.  She states that compared to how she was prior to the lipoma excision, she is quite pleased with the cosmetic outcome.  She still does appreciate a small bump, but it is not particularly bothersome at this time.  On exam, the local swelling/mass appreciated at last visit appears to be slightly improved.  The area is nontender.  No overlying erythema, induration, or other skin changes otherwise concerning for infection.  The excision site appears well-approximated and healing nicely.  There is still a discrete, 1.25 x 1.25 cm mass just beneath the excision site.  Unclear if this is residual lipoma versus inflammation from the excision.  Fortunately she has had improvement since last week and states the area is no longer tender.  Also feels as though the localized swelling is reduced, although there remains a discrete 1.25 cm mass.  Recommending daily mechanical massage as well as  silicone scar gel for the excision.  She is exceedingly pleased with the excision and improved chin symmetry.  If she performs mechanical massage for the next 3 to 6 months, suspect that small area of firmness will improve.  However, if it is persistent and bothersome, she understands that she can call the clinic to see Dr. Ladona Ridgel for reevaluation and possible reexcision.  Picture(s) obtained of the patient and placed in the chart were with the patient's or guardian's permission.

## 2022-09-04 ENCOUNTER — Encounter: Payer: Self-pay | Admitting: Physician Assistant

## 2022-09-04 ENCOUNTER — Ambulatory Visit (INDEPENDENT_AMBULATORY_CARE_PROVIDER_SITE_OTHER): Payer: 59 | Admitting: Physician Assistant

## 2022-09-04 ENCOUNTER — Other Ambulatory Visit: Payer: Self-pay

## 2022-09-04 VITALS — BP 110/70 | HR 73 | Ht 69.0 in | Wt 162.0 lb

## 2022-09-04 DIAGNOSIS — K5904 Chronic idiopathic constipation: Secondary | ICD-10-CM

## 2022-09-04 DIAGNOSIS — Z1211 Encounter for screening for malignant neoplasm of colon: Secondary | ICD-10-CM

## 2022-09-04 DIAGNOSIS — K649 Unspecified hemorrhoids: Secondary | ICD-10-CM

## 2022-09-04 MED ORDER — PLENVU 140 G PO SOLR
1.0000 | Freq: Once | ORAL | 0 refills | Status: AC
  Filled 2022-09-04: qty 1, 1d supply, fill #0
  Filled 2022-10-16: qty 3, 1d supply, fill #0

## 2022-09-04 NOTE — Patient Instructions (Addendum)
Please do the following: Purchase a bottle of Miralax over the counter as well as a box of 5 mg dulcolax tablets. Take 4 dulcolax tablets. Wait 1 hour. You will then drink 6-8 capfuls of Miralax mixed in an adequate amount of water/juice/gatorade (you may choose which of these liquids to drink) over the next 2-3 hours. You should expect results within 1 to 6 hours after completing the bowel purge. Go to the er if you have severe AB pain, can not pass gas or stool in over 12 hours, can not hold down any food.   Linzess 290 mcg *IBS-C patients may begin to experience relief from belly pain and overall abdominal symptoms (pain, discomfort, and bloating) in about 1 week,  with symptoms typically improving over 12 weeks.  Take at least 30 minutes before the first meal of the day on an empty stomach You can have a loose stool if you eat a high-fat breakfast. Give it at least 7 days, may have more bowel movements during that time.   The diarrhea should go away and you should start having normal, complete, full bowel movements.  It may be helpful to start treatment when you can be near the comfort of your own bathroom, such as a weekend.  After you are out we can send in a prescription if you did well, there is a prescription card  Recommend starting on a fiber supplement, can try metamucil first but if this causes gas/bloating switch to benefiber or citracel, these do not cause gas.  Take with fiber with with a full 8 oz glass of water once a day. This can take 1 month to start helping, so try for at least one month.  Recommend increasing water and physical activity.   - Drink at least 64-80 ounces of water/liquid per day. - Establish a time to try to move your bowels every day.  For many people, this is after a cup of coffee or after a meal such as breakfast. - Sit all of the way back on the toilet keeping your back fairly straight and while sitting up, try to rest the tops of your forearms on  your upper thighs.   - Raising your feet with a step stool/squatty potty can be helpful to improve the angle that allows your stool to pass through the rectum. - Relax the rectum feeling it bulge toward the toilet water.  If you feel your rectum raising toward your body, you are contracting rather than relaxing. - Breathe in and slowly exhale. "Belly breath" by expanding your belly towards your belly button. Keep belly expanded as you gently direct pressure down and back to the anus.  A low pitched GRRR sound can assist with increasing intra-abdominal pressure.  - Repeat 3-4 times. If unsuccessful, contract the pelvic floor to restore normal tone and get off the toilet.  Avoid excessive straining. - To reduce excessive wiping by teaching your anus to normally contract, place hands on outer aspect of knees and resist knee movement outward.  Hold 5-10 second then place hands just inside of knees and resist inward movement of knees.  Hold 5 seconds.  Repeat a few times each way.  Go to the ER if unable to pass gas, severe AB pain, unable to hold down food, any shortness of breath of chest pain.  You have been scheduled for a colonoscopy. Please follow written instructions given to you at your visit today.  Please pick up your prep supplies at the pharmacy  within the next 1-3 days. If you use inhalers (even only as needed), please bring them with you on the day of your procedure.

## 2022-09-04 NOTE — Progress Notes (Signed)
I agree with the assessment and plan as outlined by Ms. Collier. 

## 2022-09-09 ENCOUNTER — Other Ambulatory Visit: Payer: Self-pay

## 2022-09-10 ENCOUNTER — Other Ambulatory Visit: Payer: Self-pay

## 2022-09-13 ENCOUNTER — Other Ambulatory Visit: Payer: Self-pay

## 2022-09-25 ENCOUNTER — Other Ambulatory Visit: Payer: Self-pay

## 2022-09-25 ENCOUNTER — Telehealth (INDEPENDENT_AMBULATORY_CARE_PROVIDER_SITE_OTHER): Payer: Medicaid Other | Admitting: Psychiatry

## 2022-09-25 ENCOUNTER — Encounter (HOSPITAL_COMMUNITY): Payer: Self-pay | Admitting: Psychiatry

## 2022-09-25 DIAGNOSIS — F419 Anxiety disorder, unspecified: Secondary | ICD-10-CM | POA: Diagnosis not present

## 2022-09-25 DIAGNOSIS — F32A Depression, unspecified: Secondary | ICD-10-CM

## 2022-09-25 DIAGNOSIS — F314 Bipolar disorder, current episode depressed, severe, without psychotic features: Secondary | ICD-10-CM | POA: Diagnosis not present

## 2022-09-25 MED ORDER — DULOXETINE HCL 40 MG PO CPEP
40.0000 mg | ORAL_CAPSULE | Freq: Every day | ORAL | 3 refills | Status: DC
  Filled 2022-09-25 – 2022-10-28 (×3): qty 30, 30d supply, fill #0

## 2022-09-25 MED ORDER — QUETIAPINE FUMARATE 300 MG PO TABS
300.0000 mg | ORAL_TABLET | Freq: Every day | ORAL | 3 refills | Status: DC
Start: 2022-09-25 — End: 2022-12-04
  Filled 2022-09-25 – 2022-10-03 (×3): qty 30, 30d supply, fill #0
  Filled 2022-10-28 – 2022-11-04 (×3): qty 30, 30d supply, fill #1

## 2022-09-25 MED ORDER — HYDROXYZINE HCL 10 MG PO TABS
10.0000 mg | ORAL_TABLET | Freq: Three times a day (TID) | ORAL | 3 refills | Status: DC | PRN
  Filled 2022-09-25 – 2022-10-03 (×2): qty 90, 30d supply, fill #0

## 2022-09-25 MED ORDER — TRAZODONE HCL 100 MG PO TABS
100.0000 mg | ORAL_TABLET | Freq: Every evening | ORAL | 3 refills | Status: DC | PRN
Start: 2022-09-25 — End: 2022-12-04
  Filled 2022-09-25 – 2022-10-03 (×2): qty 30, 30d supply, fill #0

## 2022-09-25 MED ORDER — BUSPIRONE HCL 15 MG PO TABS
15.0000 mg | ORAL_TABLET | Freq: Three times a day (TID) | ORAL | 3 refills | Status: DC
  Filled 2022-09-25 – 2022-10-03 (×2): qty 90, 30d supply, fill #0
  Filled 2022-10-28 – 2022-11-04 (×3): qty 90, 30d supply, fill #1

## 2022-09-25 NOTE — Progress Notes (Signed)
BH MD/PA/NP OP Progress Note Virtual Visit via Video Note  I connected with Victoria Snyder on 09/25/22 at  8:30 AM EDT by a video enabled telemedicine application and verified that I am speaking with the correct person using two identifiers.  Location: Patient: Home Provider: Clinic   I discussed the limitations of evaluation and management by telemedicine and the availability of in person appointments. The patient expressed understanding and agreed to proceed.  I provided 30 minutes of non-face-to-face time during this encounter.         09/25/2022 8:33 AM Victoria Snyder  MRN:  [id number]  Chief Complaint:  "I have been doing good"   HPI: 57 year old female seen today for follow up psychiatric evaluation. She has a  psychiatric history of Bipolar 2, anxiety, and depression. She is currently being managed on Seroquel 300 mg nighty, Trazodone 100 mg nightly, Cymbalta 60 mg (by PCP),  Buspar 15 mg three times daily, and Hydroxyzine 10 mg three times daily as needed.  Patient informed writer that her medication are somewhat effective in managing her psychiatric conditions.   Today she was well-groomed, pleasant, cooperative, engaged in conversation, and maintained eye contact.  She informed Clinical research associate that she is doing good. She notes that she continues to work on her physical health. She notes that she has been setting up appointments such as her eye exam, mammogram, and colonoscopy. Patient notes that her hip continue to bother her. She informed Clinical research associate that recently she has been having hip spasms.  She quantifies her pains as 6/10.   Patient notes that she was approved for disability which she reports has reduced a lot of her stress. She does note that she feels paranoid that her ex husband is out to get her. She not longer lives with him and reports that he is letting the home go into foreclosure. She notes that she has hired a Clinical research associate to assist with this but has not heard back from them.  Dispite this stressor she notes that her anxiety and depression continues to improve.  Today provider conducted a GAD-7 and patient scored a 11, at her last visit she scored a 13.  Provider also conducted PHQ-9 and patient scored a 11, at her last visit she scored a 17.  She endorses adequate sleep (5 hours) and appetite.  Patient reports that her hallucinations are not as frequent.  Patient endorses passive SI but denies wanting to harm herself today.  She informed Clinical research associate that recently she has been thinking more of her sister who passed away.  She notes that she fears death.  Today she denies SI/HI/VAH or mania.  Patient notes that Cymbalta 60 makes her feel weird and request that provider reduce it today. Today provider reduced Cymbalta 60 mg to 40 mg. She will continue all other medications as prescribed and follow-up with outpatient counseling for therapy.  No other concerns noted at this time.       Visit Diagnosis:  No diagnosis found.   Past Psychiatric History: anxiety, depression, insomnia, and bipolar 2 disorder. Past Medical History:  Past Medical History:  Diagnosis Date   Anxiety    Arthritis    Bipolar disorder (HCC)    Blood transfusion without reported diagnosis    Hypertension    Hypokalemia    Low back pain with radiation    Vertigo     Past Surgical History:  Procedure Laterality Date   ABDOMINAL HYSTERECTOMY     partial for fibroids  EXPLORATORY LAPAROTOMY  2011   mass in abdomen removed-uterine leiomyomata   TOTAL HIP ARTHROPLASTY Right 01/30/2022   Procedure: RIGHT TOTAL HIP ARTHROPLASTY ANTERIOR APPROACH;  Surgeon: Eldred Manges, MD;  Location: [facility] OR;  Service: Orthopedics;  Laterality: Right;    Family Psychiatric History: Notes that her half brother had a mental illness however she is unaware of what it was.     Family History:  Family History  Problem Relation Age of Onset   Heart disease Mother        heart attack   Stroke Father    Breast cancer  Neg Hx     Social History:  Social History   Socioeconomic History   Marital status: Single    Spouse name: Not on file   Number of children: 1   Years of education: Not on file   Highest education level: 12th grade  Occupational History   Occupation: Unemployed  Tobacco Use   Smoking status: Former    Types: Cigarettes    Quit date: 2001    Years since quitting: 23.3   Smokeless tobacco: Never  Vaping Use   Vaping Use: Never used  Substance and Sexual Activity   Alcohol use: Yes    Comment: Drinks occasionally   Drug use: No   Sexual activity: Yes    Birth control/protection: Surgical  Other Topics Concern   Not on file  Social History Narrative   Separated.  1 child   Not working.    Social Determinants of Health   Financial Resource Strain: Not on file  Food Insecurity: No Food Insecurity (05/03/2021)   Hunger Vital Sign    Worried About Running Out of Food in the Last Year: Never true    Ran Out of Food in the Last Year: Never true  Transportation Needs: No Transportation Needs (05/03/2021)   PRAPARE - Administrator, Civil Service (Medical): No    Lack of Transportation (Non-Medical): No  Physical Activity: Not on file  Stress: Not on file  Social Connections: Not on file    Allergies: No Known Allergies  Metabolic Disorder Labs: Lab Results  Component Value Date   HGBA1C 5.2 06/06/2021   No results found for: "PROLACTIN" Lab Results  Component Value Date   CHOL 193 06/04/2021   TRIG 118.0 06/04/2021   HDL 64.60 06/04/2021   CHOLHDL 3 06/04/2021   VLDL 23.6 06/04/2021   LDLCALC 105 (H) 06/04/2021   LDLCALC 94 08/18/2020   Lab Results  Component Value Date   TSH 1.04 06/04/2021   TSH 1.840 10/22/2017    Therapeutic Level Labs: No results found for: "LITHIUM" No results found for: "VALPROATE" No results found for: "CBMZ"  Current Medications: Current Outpatient Medications  Medication Sig Dispense Refill   amLODipine  (NORVASC) 5 MG tablet Take 1 tablet (5 mg total) by mouth daily. 90 tablet 3   Ascorbic Acid (VITAMIN C) 1000 MG tablet Take 1,000 mg by mouth daily.     busPIRone (BUSPAR) 15 MG tablet Take 1 tablet (15 mg total) by mouth 3 (three) times daily. 90 tablet 3   DULoxetine (CYMBALTA) 60 MG capsule Take 1 capsule (60 mg total) by mouth daily. 90 capsule 1   gabapentin (NEURONTIN) 400 MG capsule Take 1 capsule (400 mg total) by mouth 3 (three) times daily. To help with back pain with radiation 90 capsule 3   hydrOXYzine (ATARAX) 10 MG tablet Take 1 tablet (10 mg total) by mouth 3 (  three) times daily as needed for anxiety. 90 tablet 3   Multiple Vitamins-Minerals (MULTIVITAMIN WITH MINERALS) tablet Take 1 tablet by mouth daily.     oxyCODONE-acetaminophen (PERCOCET) 5-325 MG tablet Take 1-2 tablets by mouth every 6 (six) hours as needed for severe pain. 40 tablet 0   QUEtiapine (SEROQUEL) 300 MG tablet Take 1 tablet (300 mg total) by mouth at bedtime. 30 tablet 3   traZODone (DESYREL) 100 MG tablet Take 1 tablet (100 mg total) by mouth at bedtime as needed for sleep. 30 tablet 3   zinc gluconate 50 MG tablet Take 50 mg by mouth daily.     No current facility-administered medications for this visit.     Musculoskeletal: Strength & Muscle Tone: within normal limits and telehealth visit Gait & Station: normal, telehealth visit Patient leans: N/A  Psychiatric Specialty Exam: Review of Systems  There were no vitals taken for this visit.There is no height or weight on file to calculate BMI.  General Appearance: Well Groomed  Eye Contact:  Good  Speech:  Clear and Coherent and Normal Rate  Volume:  Normal  Mood:  Euthymic  Affect:  Congruent  Thought Process:  Coherent, Goal Directed and Linear  Orientation:  Full (Time, Place, and Person)  Thought Content: WDL and Logical   Suicidal Thoughts:  Yes.  without intent/plan  Homicidal Thoughts:  No  Memory:  Immediate;   Good Recent;    Good Remote;   Good  Judgement:  Good  Insight:  Good  Psychomotor Activity:  Normal  Concentration:  Concentration: Good and Attention Span: Good  Recall:  Good  Fund of Knowledge: Good  Language: Good  Akathisia:  No  Handed:  Right  AIMS (if indicated): Not done  Assets:  Communication Skills Desire for Improvement Financial Resources/Insurance Housing Social Support  ADL's:  Intact  Cognition: WNL  Sleep:  Fair   Screenings: GAD-7    Flowsheet Row Video Visit from 07/03/2022 in [facility] Video Visit from 04/26/2022 in [facility] Video Visit from 02/18/2022 in [facility] Counselor from 02/15/2022 in [facility] Video Visit from 11/15/2021 in [facility]  Total GAD-7 Score 13 21 14 11 18       PHQ2-9    Flowsheet Row Video Visit from 07/03/2022 in [facility] Office Visit from 06/19/2022 in [facility] Video Visit from 04/26/2022 in [facility] Video Visit from 02/18/2022 in [facility] Counselor from 02/15/2022 in [facility]  PHQ-2 Total Score 4 5 5 4 2   PHQ-9 Total Score 17 22 23 17 10       Flowsheet Row Video Visit from 07/03/2022 in [facility] Video Visit from 04/26/2022 in [facility] Video Visit from 02/18/2022 in [facility]  C-SSRS RISK CATEGORY Error: Q7 should not be populated when Q6 is No Error: Q7 should not be populated when Q6 is No Error: Q7 should not be populated when Q6 is No        Assessment and Plan: Patient reports that her anxiety, depression, and VAH has improved since her last visit.  She also notes that she is hallucinating less but feels that her ex husband is out to get her.  Patient notes that she finds Cymbalta effective in a lower doses and request that  provider lower it today as she will not see her PCP for a while. Today Cymbalta reduced from 60 mg daily to 40 mg daily. She will continue all other medications as prescribed.    1. Anxiety and depression  Continue- busPIRone (BUSPAR) 15 MG tablet; Take 1 tablet (15 mg total) by mouth 3 (three) times daily.  Dispense: 90 tablet; Refill: 3 Reduced- DULoxetine HCl 40 MG CPEP; Take 1 capsule (40 mg total) by mouth daily.  Dispense: 30 capsule; Refill: 3 Continue- hydrOXYzine (ATARAX) 10 MG tablet; Take 1 tablet (10 mg total) by mouth 3 (three) times daily as needed for anxiety.  Dispense: 90 tablet; Refill: 3 Continue- QUEtiapine (SEROQUEL) 300 MG tablet; Take 1 tablet (300 mg total) by mouth at bedtime.  Dispense: 30 tablet; Refill: 3 Continue- traZODone (DESYREL) 100 MG tablet; Take 1 tablet (100 mg total) by mouth at bedtime as needed for sleep.  Dispense: 30 tablet; Refill: 3  2. Bipolar disorder, current episode depressed, severe, without psychotic features (HCC)  Continue- QUEtiapine (SEROQUEL) 300 MG tablet; Take 1 tablet (300 mg total) by mouth at bedtime.  Dispense: 30 tablet; Refill: 3 Continue- traZODone (DESYREL) 100 MG tablet; Take 1 tablet (100 mg total) by mouth at bedtime as needed for sleep.  Dispense: 30 tablet; Refill: 3   Follow-up in 2.5 months Follow-up with therapy   Shanna Cisco, NP 09/25/2022, 8:33 AM

## 2022-10-02 ENCOUNTER — Other Ambulatory Visit: Payer: Self-pay

## 2022-10-03 ENCOUNTER — Other Ambulatory Visit: Payer: Self-pay

## 2022-10-08 ENCOUNTER — Telehealth: Payer: Self-pay | Admitting: Orthopaedic Surgery

## 2022-10-08 NOTE — Telephone Encounter (Signed)
Patient called asked if she can get something called into her pharmacy for muscle spasms. Patient said lately she has been having a lot of spasm in her right leg.  Patient uses [facility]. The number to contact patient is [phone]

## 2022-10-08 NOTE — Telephone Encounter (Signed)
noted 

## 2022-10-08 NOTE — Telephone Encounter (Signed)
Please advise 

## 2022-10-14 ENCOUNTER — Encounter: Payer: Self-pay | Admitting: Certified Registered Nurse Anesthetist

## 2022-10-16 ENCOUNTER — Other Ambulatory Visit: Payer: Self-pay

## 2022-10-18 ENCOUNTER — Encounter: Payer: Self-pay | Admitting: Internal Medicine

## 2022-10-18 ENCOUNTER — Ambulatory Visit (AMBULATORY_SURGERY_CENTER): Payer: 59 | Admitting: Internal Medicine

## 2022-10-18 VITALS — BP 127/80 | HR 68 | Temp 97.7°F | Resp 14 | Ht 69.0 in | Wt 162.0 lb

## 2022-10-18 DIAGNOSIS — F419 Anxiety disorder, unspecified: Secondary | ICD-10-CM | POA: Diagnosis not present

## 2022-10-18 DIAGNOSIS — I10 Essential (primary) hypertension: Secondary | ICD-10-CM | POA: Diagnosis not present

## 2022-10-18 DIAGNOSIS — K5904 Chronic idiopathic constipation: Secondary | ICD-10-CM | POA: Diagnosis not present

## 2022-10-18 DIAGNOSIS — Z1211 Encounter for screening for malignant neoplasm of colon: Secondary | ICD-10-CM | POA: Diagnosis not present

## 2022-10-18 DIAGNOSIS — F319 Bipolar disorder, unspecified: Secondary | ICD-10-CM | POA: Diagnosis not present

## 2022-10-18 HISTORY — PX: COLONOSCOPY: SHX174

## 2022-10-18 MED ORDER — SODIUM CHLORIDE 0.9 % IV SOLN
500.0000 mL | Freq: Once | INTRAVENOUS | Status: DC
Start: 2022-10-18 — End: 2022-10-18

## 2022-10-18 NOTE — Patient Instructions (Addendum)
-  Handout on hemorrhoids and diverticulosis provided -repeat colonoscopy  in 10 years for surveillance. -Continue present medications  -Follow up in office in 6 week. To be scheduled from Dr. Derek Mound office   YOU HAD AN ENDOSCOPIC PROCEDURE TODAY AT THE [facility]:   Refer to the procedure report that was given to you for any specific questions about what was found during the examination.  If the procedure report does not answer your questions, please call your gastroenterologist to clarify.  If you requested that your care partner not be given the details of your procedure findings, then the procedure report has been included in a sealed envelope for you to review at your convenience later.  YOU SHOULD EXPECT: Some feelings of bloating in the abdomen. Passage of more gas than usual.  Walking can help get rid of the air that was put into your GI tract during the procedure and reduce the bloating. If you had a lower endoscopy (such as a colonoscopy or flexible sigmoidoscopy) you may notice spotting of blood in your stool or on the toilet paper. If you underwent a bowel prep for your procedure, you may not have a normal bowel movement for a few days.  Please Note:  You might notice some irritation and congestion in your nose or some drainage.  This is from the oxygen used during your procedure.  There is no need for concern and it should clear up in a day or so.  SYMPTOMS TO REPORT IMMEDIATELY:  Following lower endoscopy (colonoscopy or flexible sigmoidoscopy):  Excessive amounts of blood in the stool  Significant tenderness or worsening of abdominal pains  Swelling of the abdomen that is new, acute  Fever of 100F or higher  For urgent or emergent issues, a gastroenterologist can be reached at any hour by calling [phone]. Do not use MyChart messaging for urgent concerns.    DIET:  We do recommend a small meal at first, but then you may proceed to your regular diet.  Drink  plenty of fluids but you should avoid alcoholic beverages for 24 hours.  ACTIVITY:  You should plan to take it easy for the rest of today and you should NOT DRIVE or use heavy machinery until tomorrow (because of the sedation medicines used during the test).    FOLLOW UP: Our staff will call the number listed on your records the next business day following your procedure.  We will call around 7:15- 8:00 am to check on you and address any questions or concerns that you may have regarding the information given to you following your procedure. If we do not reach you, we will leave a message.     If any biopsies were taken you will be contacted by phone or by letter within the next 1-3 weeks.  Please call us at [phone] if you have not heard about the biopsies in 3 weeks.    SIGNATURES/CONFIDENTIALITY: You and/or your care partner have signed paperwork which will be entered into your electronic medical record.  These signatures attest to the fact that that the information above on your After Visit Summary has been reviewed and is understood.  Full responsibility of the confidentiality of this discharge information lies with you and/or your care-partner.

## 2022-10-18 NOTE — Progress Notes (Signed)
GASTROENTEROLOGY PROCEDURE H&P NOTE   Primary Care Physician: Karie Georges, MD    Reason for Procedure:   Colon cancer screening  Plan:    Colonoscopy  Patient is appropriate for endoscopic procedure(s) in the ambulatory ([facility]) setting.  The nature of the procedure, as well as the risks, benefits, and alternatives were carefully and thoroughly reviewed with the patient. Ample time for discussion and questions allowed. The patient understood, was satisfied, and agreed to proceed.     HPI: Victoria Snyder is a 57 y.o. female who presents for colonoscopy for evaluation of colon cancer screening .  Patient was most recently seen in the Gastroenterology Clinic on 09/04/22.  This is her first colonoscopy. No interval change in medical history since that appointment. Please refer to that note for full details regarding GI history and clinical presentation.   Past Medical History:  Diagnosis Date   Anxiety    Arthritis    Bipolar disorder (HCC)    Blood transfusion without reported diagnosis    Hypertension    Hypokalemia    Low back pain with radiation    Vertigo     Past Surgical History:  Procedure Laterality Date   ABDOMINAL HYSTERECTOMY     partial for fibroids   COLONOSCOPY  10/18/2022   EXPLORATORY LAPAROTOMY  2011   mass in abdomen removed-uterine leiomyomata   TOTAL HIP ARTHROPLASTY Right 01/30/2022   Procedure: RIGHT TOTAL HIP ARTHROPLASTY ANTERIOR APPROACH;  Surgeon: Eldred Manges, MD;  Location: [facility] OR;  Service: Orthopedics;  Laterality: Right;    Prior to Admission medications   Medication Sig Start Date End Date Taking? Authorizing Provider  amLODipine (NORVASC) 5 MG tablet Take 1 tablet (5 mg total) by mouth daily. 12/05/21 12/05/22 Yes Willow Ora, MD  Ascorbic Acid (VITAMIN C) 1000 MG tablet Take 1,000 mg by mouth daily.   Yes [provider]  busPIRone (BUSPAR) 15 MG tablet Take 1 tablet (15 mg total) by mouth 3 (three) times daily. 09/25/22   Yes Toy Cookey E, NP  DULoxetine HCl 40 MG CPEP Take 1 capsule (40 mg total) by mouth daily. 09/25/22  Yes Toy Cookey E, NP  gabapentin (NEURONTIN) 400 MG capsule Take 1 capsule (400 mg total) by mouth 3 (three) times daily. To help with back pain with radiation 02/18/22  Yes Toy Cookey E, NP  hydrOXYzine (ATARAX) 10 MG tablet Take 1 tablet (10 mg total) by mouth 3 (three) times daily as needed for anxiety. 09/25/22  Yes Shanna Cisco, NP  Multiple Vitamins-Minerals (MULTIVITAMIN WITH MINERALS) tablet Take 1 tablet by mouth daily.   Yes [provider]  OVER THE COUNTER MEDICATION Sea moss   Yes [provider]  QUEtiapine (SEROQUEL) 300 MG tablet Take 1 tablet (300 mg total) by mouth at bedtime. 09/25/22  Yes Toy Cookey E, NP  traZODone (DESYREL) 100 MG tablet Take 1 tablet (100 mg total) by mouth at bedtime as needed for sleep. 09/25/22  Yes Toy Cookey E, NP  zinc gluconate 50 MG tablet Take 50 mg by mouth daily.   Yes [provider]    Current Outpatient Medications  Medication Sig Dispense Refill   amLODipine (NORVASC) 5 MG tablet Take 1 tablet (5 mg total) by mouth daily. 90 tablet 3   Ascorbic Acid (VITAMIN C) 1000 MG tablet Take 1,000 mg by mouth daily.     busPIRone (BUSPAR) 15 MG tablet Take 1 tablet (15 mg total) by mouth 3 (three)  times daily. 90 tablet 3   DULoxetine HCl 40 MG CPEP Take 1 capsule (40 mg total) by mouth daily. 30 capsule 3   gabapentin (NEURONTIN) 400 MG capsule Take 1 capsule (400 mg total) by mouth 3 (three) times daily. To help with back pain with radiation 90 capsule 3   hydrOXYzine (ATARAX) 10 MG tablet Take 1 tablet (10 mg total) by mouth 3 (three) times daily as needed for anxiety. 90 tablet 3   Multiple Vitamins-Minerals (MULTIVITAMIN WITH MINERALS) tablet Take 1 tablet by mouth daily.     OVER THE COUNTER MEDICATION Sea moss     QUEtiapine (SEROQUEL) 300 MG tablet Take 1 tablet (300 mg total) by  mouth at bedtime. 30 tablet 3   traZODone (DESYREL) 100 MG tablet Take 1 tablet (100 mg total) by mouth at bedtime as needed for sleep. 30 tablet 3   zinc gluconate 50 MG tablet Take 50 mg by mouth daily.     Current Facility-Administered Medications  Medication Dose Route Frequency Provider Last Rate Last Admin   0.9 %  sodium chloride infusion  500 mL Intravenous Once Imogene Burn, MD        Allergies as of 10/18/2022   (No Known Allergies)    Family History  Problem Relation Age of Onset   Heart disease Mother        heart attack   Stroke Father    Breast cancer Neg Hx     Social History   Socioeconomic History   Marital status: Single    Spouse name: Not on file   Number of children: 1   Years of education: Not on file   Highest education level: 12th grade  Occupational History   Occupation: Unemployed  Tobacco Use   Smoking status: Former    Types: Cigarettes    Quit date: 2001    Years since quitting: 23.4   Smokeless tobacco: Never  Vaping Use   Vaping Use: Never used  Substance and Sexual Activity   Alcohol use: Yes    Comment: Drinks occasionally   Drug use: No   Sexual activity: Yes    Birth control/protection: Surgical, Post-menopausal  Other Topics Concern   Not on file  Social History Narrative   Separated.  1 child   Not working.    Social Determinants of Health   Financial Resource Strain: Not on file  Food Insecurity: No Food Insecurity (05/03/2021)   Hunger Vital Sign    Worried About Running Out of Food in the Last Year: Never true    Ran Out of Food in the Last Year: Never true  Transportation Needs: No Transportation Needs (05/03/2021)   PRAPARE - Administrator, Civil Service (Medical): No    Lack of Transportation (Non-Medical): No  Physical Activity: Not on file  Stress: Not on file  Social Connections: Not on file  Intimate Partner Violence: Not on file    Physical Exam: Vital signs in last 24 hours: BP 126/82    Pulse 76   Temp 97.7 F (36.5 C) (Temporal)   Ht 5\' 9"  (1.753 m)   Wt 162 lb (73.5 kg)   SpO2 98%   BMI 23.92 kg/m  GEN: NAD EYE: Sclerae anicteric ENT: MMM CV: Non-tachycardic Pulm: No increased WOB GI: Soft NEURO:  Alert & Oriented   Eulah Pont, MD [facility]   10/18/2022 8:25 AM

## 2022-10-18 NOTE — Progress Notes (Signed)
Pt's states no medical or surgical changes since previsit or office visit. 

## 2022-10-18 NOTE — Op Note (Signed)
[facility] Patient Name: Victoria Snyder Procedure Date: 10/18/2022 8:31 AM MRN: [id number] Endoscopist: Madelyn Brunner Beauregard , , [id number] Age: 57 Referring MD:  Date of Birth: 1965/07/28 Gender: Female Account #: [id number] Procedure:                Colonoscopy Indications:              Screening for colorectal malignant neoplasm, This                            is the patient's first colonoscopy Medicines:                Monitored Anesthesia Care Procedure:                Pre-Anesthesia Assessment:                           - Prior to the procedure, a History and Physical                            was performed, and patient medications and                            allergies were reviewed. The patient's tolerance of                            previous anesthesia was also reviewed. The risks                            and benefits of the procedure and the sedation                            options and risks were discussed with the patient.                            All questions were answered, and informed consent                            was obtained. Prior Anticoagulants: The patient has                            taken no anticoagulant or antiplatelet agents. ASA                            Grade Assessment: II - A patient with mild systemic                            disease. After reviewing the risks and benefits,                            the patient was deemed in satisfactory condition to                            undergo the procedure.  After obtaining informed consent, the colonoscope                            was passed under direct vision. Throughout the                            procedure, the patient's blood pressure, pulse, and                            oxygen saturations were monitored continuously. The                            Olympus CF-HQ190L [id number]) Colonoscope was                            introduced through the  anus and advanced to the the                            terminal ileum. The colonoscopy was performed                            without difficulty. The patient tolerated the                            procedure well. The quality of the bowel                            preparation was good. The terminal ileum, ileocecal                            valve, appendiceal orifice, and rectum were                            photographed. Scope In: 8:33:38 AM Scope Out: 8:54:59 AM Scope Withdrawal Time: 0 hours 13 minutes 43 seconds  Total Procedure Duration: 0 hours 21 minutes 21 seconds  Findings:                 The terminal ileum appeared normal.                           Multiple diverticula were found in the sigmoid                            colon.                           Non-bleeding internal hemorrhoids were found during                            retroflexion. Complications:            No immediate complications. Estimated Blood Loss:     Estimated blood loss: none. Impression:               - The examined portion of the ileum was normal.                           -  Diverticulosis in the sigmoid colon.                           - Non-bleeding internal hemorrhoids.                           - No specimens collected. Recommendation:           - Discharge patient to home (with escort).                           - Repeat colonoscopy in 10 years for screening                            purposes.                           - Return to GI clinic in 6 weeks for follow up of                            constipation.                           - The findings and recommendations were discussed                            with the patient. Dr Particia Lather "[name]" [name],  10/18/2022 8:58:15 AM

## 2022-10-22 ENCOUNTER — Telehealth: Payer: Self-pay | Admitting: *Deleted

## 2022-10-22 NOTE — Telephone Encounter (Signed)
  Follow up Call-     10/18/2022    7:18 AM  Call back number  Post procedure Call Back phone  # [phone]  Permission to leave phone message Yes     Patient questions:  Do you have a fever, pain , or abdominal swelling? No. Pain Score  0 *  Have you tolerated food without any problems? Yes.    Have you been able to return to your normal activities? Yes.    Do you have any questions about your discharge instructions: Diet   No. Medications  No. Follow up visit  No.  Do you have questions or concerns about your Care? No.  Actions: * If pain score is 4 or above: No action needed, pain <4.

## 2022-10-24 ENCOUNTER — Telehealth (HOSPITAL_COMMUNITY): Payer: Medicaid Other | Admitting: Psychiatry

## 2022-10-28 ENCOUNTER — Other Ambulatory Visit: Payer: Self-pay

## 2022-10-28 MED ORDER — VENLAFAXINE HCL ER 75 MG PO CP24
75.0000 mg | ORAL_CAPSULE | Freq: Every day | ORAL | 3 refills | Status: DC
  Filled 2022-10-28 – 2022-11-04 (×2): qty 30, 30d supply, fill #0

## 2022-11-04 ENCOUNTER — Other Ambulatory Visit: Payer: Self-pay

## 2022-11-12 ENCOUNTER — Other Ambulatory Visit: Payer: Self-pay | Admitting: Family Medicine

## 2022-11-14 ENCOUNTER — Other Ambulatory Visit: Payer: Self-pay

## 2022-11-14 MED ORDER — ZINC GLUCONATE 50 MG PO TABS
50.0000 mg | ORAL_TABLET | Freq: Every day | ORAL | 0 refills | Status: AC
  Filled 2022-11-14 – 2023-02-13 (×2): qty 30, 30d supply, fill #0

## 2022-12-04 ENCOUNTER — Telehealth (INDEPENDENT_AMBULATORY_CARE_PROVIDER_SITE_OTHER): Payer: 59 | Admitting: Psychiatry

## 2022-12-04 ENCOUNTER — Encounter (HOSPITAL_COMMUNITY): Payer: Self-pay | Admitting: Psychiatry

## 2022-12-04 ENCOUNTER — Encounter (HOSPITAL_COMMUNITY): Payer: Self-pay

## 2022-12-04 ENCOUNTER — Other Ambulatory Visit: Payer: Self-pay

## 2022-12-04 DIAGNOSIS — F419 Anxiety disorder, unspecified: Secondary | ICD-10-CM | POA: Diagnosis not present

## 2022-12-04 DIAGNOSIS — F314 Bipolar disorder, current episode depressed, severe, without psychotic features: Secondary | ICD-10-CM | POA: Diagnosis not present

## 2022-12-04 DIAGNOSIS — F32A Depression, unspecified: Secondary | ICD-10-CM

## 2022-12-04 MED ORDER — VENLAFAXINE HCL ER 75 MG PO CP24
75.0000 mg | ORAL_CAPSULE | Freq: Every day | ORAL | 3 refills | Status: DC
  Filled 2022-12-04 – 2022-12-16 (×2): qty 30, 30d supply, fill #0
  Filled 2023-02-13: qty 30, 30d supply, fill #1

## 2022-12-04 MED ORDER — TRAZODONE HCL 100 MG PO TABS
100.0000 mg | ORAL_TABLET | Freq: Every evening | ORAL | 3 refills | Status: DC | PRN
Start: 2022-12-04 — End: 2023-03-19
  Filled 2022-12-04 – 2022-12-16 (×2): qty 30, 30d supply, fill #0

## 2022-12-04 MED ORDER — GABAPENTIN 400 MG PO CAPS
400.0000 mg | ORAL_CAPSULE | Freq: Three times a day (TID) | ORAL | 3 refills | Status: DC
Start: 2022-12-04 — End: 2023-03-19
  Filled 2022-12-04 – 2022-12-16 (×2): qty 90, 30d supply, fill #0
  Filled 2023-02-13: qty 90, 30d supply, fill #1

## 2022-12-04 MED ORDER — BUSPIRONE HCL 15 MG PO TABS
15.0000 mg | ORAL_TABLET | Freq: Three times a day (TID) | ORAL | 3 refills | Status: DC
Start: 2022-12-04 — End: 2023-03-19
  Filled 2022-12-04 – 2022-12-16 (×2): qty 90, 30d supply, fill #0
  Filled 2023-02-13: qty 90, 30d supply, fill #1

## 2022-12-04 MED ORDER — QUETIAPINE FUMARATE 300 MG PO TABS
300.0000 mg | ORAL_TABLET | Freq: Every day | ORAL | 3 refills | Status: DC
Start: 2022-12-04 — End: 2023-03-19
  Filled 2022-12-04 – 2022-12-16 (×2): qty 30, 30d supply, fill #0
  Filled 2023-02-13: qty 30, 30d supply, fill #1
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
## 2022-12-11 ENCOUNTER — Ambulatory Visit: Payer: 59 | Admitting: Internal Medicine

## 2022-12-11 ENCOUNTER — Other Ambulatory Visit: Payer: Self-pay

## 2022-12-12 ENCOUNTER — Other Ambulatory Visit: Payer: Self-pay

## 2022-12-16 ENCOUNTER — Other Ambulatory Visit: Payer: Self-pay

## 2022-12-16 ENCOUNTER — Other Ambulatory Visit: Payer: Self-pay | Admitting: Family Medicine

## 2022-12-16 DIAGNOSIS — I10 Essential (primary) hypertension: Secondary | ICD-10-CM

## 2022-12-17 ENCOUNTER — Other Ambulatory Visit: Payer: Self-pay

## 2022-12-18 ENCOUNTER — Encounter: Payer: Self-pay | Admitting: Family Medicine

## 2022-12-18 ENCOUNTER — Ambulatory Visit (INDEPENDENT_AMBULATORY_CARE_PROVIDER_SITE_OTHER): Payer: 59 | Admitting: Family Medicine

## 2022-12-18 ENCOUNTER — Other Ambulatory Visit: Payer: Self-pay

## 2022-12-18 VITALS — BP 110/80 | HR 75 | Temp 98.7°F | Ht 69.0 in | Wt 159.0 lb

## 2022-12-18 DIAGNOSIS — F419 Anxiety disorder, unspecified: Secondary | ICD-10-CM | POA: Diagnosis not present

## 2022-12-18 DIAGNOSIS — I10 Essential (primary) hypertension: Secondary | ICD-10-CM

## 2022-12-18 DIAGNOSIS — Z13220 Encounter for screening for lipoid disorders: Secondary | ICD-10-CM

## 2022-12-18 DIAGNOSIS — F32A Depression, unspecified: Secondary | ICD-10-CM | POA: Diagnosis not present

## 2022-12-18 MED ORDER — AMLODIPINE BESYLATE 5 MG PO TABS
5.0000 mg | ORAL_TABLET | Freq: Every day | ORAL | 3 refills | Status: DC
Start: 2022-12-18 — End: 2023-10-17
  Filled 2022-12-18: qty 30, 30d supply, fill #0
  Filled 2023-02-13: qty 30, 30d supply, fill #1
  Filled 2023-03-31: qty 30, 30d supply, fill #2
  Filled 2023-05-08: qty 30, 30d supply, fill #3
  Filled 2023-06-17: qty 30, 30d supply, fill #4
  Filled 2023-07-22: qty 30, 30d supply, fill #5
  Filled 2023-08-28: qty 30, 30d supply, fill #6
  Filled 2023-10-01: qty 30, 30d supply, fill #7

## 2022-12-18 NOTE — Assessment & Plan Note (Signed)
Chronic, controlled, continue amlodipine 5 mg daily, needs new CMP and lipid panel for screening.

## 2022-12-18 NOTE — Progress Notes (Signed)
Established Patient Office Visit  Subjective   Patient ID: Victoria Snyder, female    DOB: 1965-09-04  Age: 57 y.o. MRN: [id number]  Chief Complaint  Patient presents with   Medical Management of Chronic Issues    Pt is here for 6 month follow up today.  HTN -- BP in office performed and is well controlled. She  reports no side effects to the medications, no chest pain, SOB, dizziness or headaches. She has a BP cuff at home and is checking BP regularly, reports they are in the normal range.   There are no preventive care reminders to display for this patient.   Current Outpatient Medications  Medication Instructions   amLODipine (NORVASC) 5 mg, Oral, Daily   busPIRone (BUSPAR) 15 mg, Oral, 3 times daily   gabapentin (NEURONTIN) 400 mg, Oral, 3 times daily, To help with back pain with radiation   hydrOXYzine (ATARAX) 10 mg, Oral, 3 times daily PRN   Multiple Vitamins-Minerals (MULTIVITAMIN WITH MINERALS) tablet 1 tablet, Oral, Daily   OVER THE COUNTER MEDICATION Sea moss   QUEtiapine (SEROQUEL) 300 mg, Oral, Daily at bedtime   traZODone (DESYREL) 100 mg, Oral, At bedtime PRN   venlafaxine XR (EFFEXOR-XR) 75 mg, Oral, Daily   vitamin C 1,000 mg, Oral, Daily   zinc gluconate 50 mg, Oral, Daily    Patient Active Problem List   Diagnosis Date Noted   Status post total hip replacement, right 01/30/2022   Cervicalgia 07/13/2021   DDD (degenerative disc disease), lumbar 12/17/2018   Spinal stenosis of lumbar region 12/17/2018   Essential hypertension 03/31/2018   Low back pain with radiation 03/31/2018   History of vertigo 03/31/2018   Anxiety and depression 03/31/2018   Bipolar disorder (HCC) 03/31/2018      Review of Systems  All other systems reviewed and are negative.     Objective:     BP 110/80 (BP Location: Left Arm, Patient Position: Sitting, Cuff Size: Normal)   Pulse 75   Temp 98.7 F (37.1 C) (Oral)   Ht 5\' 9"  (1.753 m)   Wt 159 lb (72.1 kg)   SpO2 98%    BMI 23.48 kg/m    Physical Exam Vitals reviewed.  Constitutional:      Appearance: Normal appearance. She is normal weight.  Neck:     Thyroid: No thyromegaly.  Cardiovascular:     Rate and Rhythm: Normal rate and regular rhythm.     Pulses: Normal pulses.     Heart sounds: Normal heart sounds.  Pulmonary:     Effort: Pulmonary effort is normal.     Breath sounds: Normal breath sounds. No wheezing, rhonchi or rales.  Abdominal:     General: Bowel sounds are normal.  Neurological:     Mental Status: She is alert and oriented to person, place, and time. Mental status is at baseline.  Psychiatric:        Mood and Affect: Mood normal.        Behavior: Behavior normal.        Thought Content: Thought content normal.      No results found for any visits on 12/18/22.    The 10-year ASCVD risk score (Arnett DK, et al., 2019) is: 3.2%    Assessment & Plan:  Essential hypertension Assessment & Plan: Chronic, controlled, continue amlodipine 5 mg daily, needs new CMP and lipid panel for screening.   Orders: -     Comprehensive metabolic panel; Future -  amLODIPine Besylate; Take 1 tablet (5 mg total) by mouth daily.  Dispense: 90 tablet; Refill: 3  Anxiety and depression Assessment & Plan: Reviewed her last notes from [facility], will continue to follow as needed, BH is managing her medications at this time.   Orders: -     CBC with Differential/Platelet; Future  Lipid screening -     Lipid panel; Future     Return in about 6 months (around 06/20/2023) for annual physical exam.    Karie Georges, MD

## 2022-12-18 NOTE — Assessment & Plan Note (Signed)
Reviewed her last notes from [facility], will continue to follow as needed, BH is managing her medications at this time.

## 2022-12-24 ENCOUNTER — Other Ambulatory Visit (INDEPENDENT_AMBULATORY_CARE_PROVIDER_SITE_OTHER): Payer: 59

## 2022-12-24 DIAGNOSIS — I10 Essential (primary) hypertension: Secondary | ICD-10-CM | POA: Diagnosis not present

## 2022-12-24 DIAGNOSIS — F32A Depression, unspecified: Secondary | ICD-10-CM

## 2022-12-24 DIAGNOSIS — F419 Anxiety disorder, unspecified: Secondary | ICD-10-CM

## 2022-12-24 DIAGNOSIS — Z13220 Encounter for screening for lipoid disorders: Secondary | ICD-10-CM | POA: Diagnosis not present

## 2022-12-24 LAB — LIPID PANEL
Cholesterol: 190 mg/dL (ref 0–200)
HDL: 83.9 mg/dL (ref >39.00)
LDL Cholesterol: 87 mg/dL (ref 0–99)
NonHDL: 105.92
Total CHOL/HDL Ratio: 2
Triglycerides: 96 mg/dL (ref 0.0–149.0)
VLDL: 19.2 mg/dL (ref 0.0–40.0)

## 2022-12-24 LAB — CBC WITH DIFFERENTIAL/PLATELET
Basophils Absolute: 0 10*3/uL (ref 0.0–0.1)
Basophils Relative: 0.8 % (ref 0.0–3.0)
Eosinophils Absolute: 0.1 10*3/uL (ref 0.0–0.7)
Eosinophils Relative: 2.3 % (ref 0.0–5.0)
HCT: 44.7 % (ref 36.0–46.0)
Hemoglobin: 15.1 g/dL — ABNORMAL HIGH (ref 12.0–15.0)
Lymphocytes Relative: 36.1 % (ref 12.0–46.0)
Lymphs Abs: 2 10*3/uL (ref 0.7–4.0)
MCHC: 33.8 g/dL (ref 30.0–36.0)
MCV: 92.6 fl (ref 78.0–100.0)
Monocytes Absolute: 0.5 10*3/uL (ref 0.1–1.0)
Monocytes Relative: 8.4 % (ref 3.0–12.0)
Neutro Abs: 2.9 10*3/uL (ref 1.4–7.7)
Neutrophils Relative %: 52.4 % (ref 43.0–77.0)
Platelets: 284 10*3/uL (ref 150.0–400.0)
RBC: 4.82 Mil/uL (ref 3.87–5.11)
RDW: 13 % (ref 11.5–15.5)
WBC: 5.6 10*3/uL (ref 4.0–10.5)

## 2022-12-24 LAB — COMPREHENSIVE METABOLIC PANEL
ALT: 16 U/L (ref 0–35)
AST: 16 U/L (ref 0–37)
Albumin: 4.7 g/dL (ref 3.5–5.2)
Alkaline Phosphatase: 90 U/L (ref 39–117)
BUN: 11 mg/dL (ref 6–23)
CO2: 29 mEq/L (ref 19–32)
Calcium: 10.2 mg/dL (ref 8.4–10.5)
Chloride: 102 mEq/L (ref 96–112)
Creatinine, Ser: 0.89 mg/dL (ref 0.40–1.20)
GFR: 71.58 mL/min (ref >60.00)
Glucose, Bld: 98 mg/dL (ref 70–99)
Potassium: 3.9 mEq/L (ref 3.5–5.1)
Sodium: 140 mEq/L (ref 135–145)
Total Bilirubin: 0.9 mg/dL (ref 0.2–1.2)
Total Protein: 7.4 g/dL (ref 6.0–8.3)

## 2023-02-03 DIAGNOSIS — M5416 Radiculopathy, lumbar region: Secondary | ICD-10-CM | POA: Diagnosis not present

## 2023-02-03 DIAGNOSIS — G8929 Other chronic pain: Secondary | ICD-10-CM | POA: Diagnosis not present

## 2023-02-03 DIAGNOSIS — M5116 Intervertebral disc disorders with radiculopathy, lumbar region: Secondary | ICD-10-CM | POA: Diagnosis not present

## 2023-02-03 DIAGNOSIS — M5451 Vertebrogenic low back pain: Secondary | ICD-10-CM | POA: Diagnosis not present

## 2023-02-13 ENCOUNTER — Other Ambulatory Visit: Payer: Self-pay

## 2023-02-17 ENCOUNTER — Other Ambulatory Visit: Payer: Self-pay

## 2023-02-25 ENCOUNTER — Other Ambulatory Visit: Payer: Self-pay

## 2023-02-25 ENCOUNTER — Other Ambulatory Visit (INDEPENDENT_AMBULATORY_CARE_PROVIDER_SITE_OTHER): Payer: Self-pay

## 2023-02-25 ENCOUNTER — Encounter: Payer: Self-pay | Admitting: Orthopaedic Surgery

## 2023-02-25 ENCOUNTER — Ambulatory Visit (INDEPENDENT_AMBULATORY_CARE_PROVIDER_SITE_OTHER): Payer: 59 | Admitting: Orthopaedic Surgery

## 2023-02-25 VITALS — BP 114/77 | HR 73

## 2023-02-25 DIAGNOSIS — Z96641 Presence of right artificial hip joint: Secondary | ICD-10-CM

## 2023-02-25 NOTE — Progress Notes (Signed)
Office Visit Note   Patient: Victoria Snyder           Date of Birth: 1965/12/24           MRN: [id number] Visit Date: 02/25/2023              Requested by: Karie Georges, MD [address] [place],  [place] [address] PCP: Karie Georges, MD   Assessment & Plan: Visit Diagnoses:  1. Status post total hip replacement, right     Plan: Reviewed x-rays with patient she has some heterotopic ossification medially which is unusual.  Does not bother her.  She can resume work activities and can return if she starts having progressive left hip discomfort.  At present with her minimal symptoms she certainly could wait on an epidural for her back on the left at L4-5 where her previous disc bulge with some lateral recess narrowing was noted.  Follow-Up Instructions: No follow-ups on file.   Orders:  Orders Placed This Encounter  Procedures   XR Pelvis 1-2 Views   No orders of the defined types were placed in this encounter.     Procedures: No procedures performed   Clinical Data: No additional findings.   Subjective: Chief Complaint  Patient presents with   Right Hip - Routine Post Op    HPI patient returns she states her hip is feeling better she is walking better without limping and states she is ready to resume working out at the gym.  She has had some problems in her back in the past with disc bulge at L4-5 by MRI and states she had an epidural 9 months ago that did well and she is wondering if she should get another one now.  She denies weakness in her leg just some intermittent aching that goes into her buttocks and into her left thigh.  Review of Systems all systems updated unchanged.   Objective: Vital Signs: BP 114/77   Pulse 73   Physical Exam Constitutional:      Appearance: She is well-developed.  HENT:     Head: Normocephalic.     Right Ear: External ear normal.     Left Ear: External ear normal. There is no impacted cerumen.  Eyes:      Pupils: Pupils are equal, round, and reactive to light.  Neck:     Thyroid: No thyromegaly.     Trachea: No tracheal deviation.  Cardiovascular:     Rate and Rhythm: Normal rate.  Pulmonary:     Effort: Pulmonary effort is normal.  Abdominal:     Palpations: Abdomen is soft.  Musculoskeletal:     Cervical back: No rigidity.  Skin:    General: Skin is warm and dry.  Neurological:     Mental Status: She is alert and oriented to person, place, and time.  Psychiatric:        Behavior: Behavior normal.     Ortho Exam healed right direct anterior incision portion of the incision is widened slightly with keloid but is not raised.  No pain with hip range of motion she is ambulating without limping.  Good strength in hip flexors and abductor.  Specialty Comments:  No specialty comments available.  Imaging: XR Pelvis 1-2 Views  Result Date: 02/25/2023 Standing AP pelvis showing both hips comparison 01/30/2022 images.  There is well position right total hip arthroplasty.  Heterotopic calcification noted medial to the lesser calcar without subsidence of the femoral prosthesis.  Opposite  left hip again demonstrates osteoarthritis with joint space narrowing and osteophytes. Impression: Post right total of arthroplasty with heterotopic ossification medial to the lesser trochanter.    PMFS History: Patient Active Problem List   Diagnosis Date Noted   Status post total hip replacement, right 01/30/2022   Cervicalgia 07/13/2021   DDD (degenerative disc disease), lumbar 12/17/2018   Spinal stenosis of lumbar region 12/17/2018   Essential hypertension 03/31/2018   Low back pain with radiation 03/31/2018   History of vertigo 03/31/2018   Anxiety and depression 03/31/2018   Bipolar disorder (HCC) 03/31/2018   Past Medical History:  Diagnosis Date   Anxiety    Arthritis    Bipolar disorder (HCC)    Blood transfusion without reported diagnosis    Hypertension    Hypokalemia    Low back  pain with radiation    Vertigo     Family History  Problem Relation Age of Onset   Heart disease Mother        heart attack   Stroke Father    Breast cancer Neg Hx     Past Surgical History:  Procedure Laterality Date   ABDOMINAL HYSTERECTOMY     partial for fibroids   COLONOSCOPY  10/18/2022   EXPLORATORY LAPAROTOMY  2011   mass in abdomen removed-uterine leiomyomata   TOTAL HIP ARTHROPLASTY Right 01/30/2022   Procedure: RIGHT TOTAL HIP ARTHROPLASTY ANTERIOR APPROACH;  Surgeon: Eldred Manges, MD;  Location: [facility] OR;  Service: Orthopedics;  Laterality: Right;   Social History   Occupational History   Occupation: Unemployed  Tobacco Use   Smoking status: Former    Current packs/day: 0.00    Types: Cigarettes    Quit date: 2001    Years since quitting: 23.7   Smokeless tobacco: Never  Vaping Use   Vaping status: Never Used  Substance and Sexual Activity   Alcohol use: Yes    Comment: Drinks occasionally   Drug use: No   Sexual activity: Yes    Birth control/protection: Surgical, Post-menopausal

## 2023-02-27 ENCOUNTER — Other Ambulatory Visit: Payer: Self-pay

## 2023-03-05 ENCOUNTER — Encounter (HOSPITAL_COMMUNITY): Payer: Self-pay

## 2023-03-05 ENCOUNTER — Other Ambulatory Visit: Payer: Self-pay

## 2023-03-05 ENCOUNTER — Telehealth (HOSPITAL_COMMUNITY): Payer: 59 | Admitting: Psychiatry

## 2023-03-19 ENCOUNTER — Other Ambulatory Visit: Payer: Self-pay

## 2023-03-19 ENCOUNTER — Telehealth (HOSPITAL_COMMUNITY): Payer: 59 | Admitting: Psychiatry

## 2023-03-19 ENCOUNTER — Encounter (HOSPITAL_COMMUNITY): Payer: Self-pay | Admitting: Psychiatry

## 2023-03-19 DIAGNOSIS — F411 Generalized anxiety disorder: Secondary | ICD-10-CM

## 2023-03-19 DIAGNOSIS — F3131 Bipolar disorder, current episode depressed, mild: Secondary | ICD-10-CM | POA: Diagnosis not present

## 2023-03-19 MED ORDER — BUSPIRONE HCL 15 MG PO TABS
15.0000 mg | ORAL_TABLET | Freq: Three times a day (TID) | ORAL | 3 refills | Status: DC
  Filled 2023-03-19: qty 90, 30d supply, fill #0
  Filled 2023-05-08: qty 90, 30d supply, fill #1
  Filled 2023-06-17: qty 90, 30d supply, fill #2

## 2023-03-19 MED ORDER — TRAZODONE HCL 100 MG PO TABS
100.0000 mg | ORAL_TABLET | Freq: Every evening | ORAL | 3 refills | Status: DC | PRN
Start: 2023-03-19 — End: 2023-06-19
  Filled 2023-03-19: qty 30, 30d supply, fill #0

## 2023-03-19 MED ORDER — VENLAFAXINE HCL ER 75 MG PO CP24
75.0000 mg | ORAL_CAPSULE | Freq: Every day | ORAL | 3 refills | Status: AC
Start: 2023-03-19 — End: ?
  Filled 2023-03-19: qty 30, 30d supply, fill #0
  Filled 2023-05-08: qty 30, 30d supply, fill #1
  Filled 2023-06-17: qty 30, 30d supply, fill #2

## 2023-03-19 MED ORDER — GABAPENTIN 400 MG PO CAPS
400.0000 mg | ORAL_CAPSULE | Freq: Three times a day (TID) | ORAL | 3 refills | Status: DC
  Filled 2023-03-19: qty 90, 30d supply, fill #0
  Filled 2023-05-08: qty 90, 30d supply, fill #1
  Filled 2023-06-17: qty 90, 30d supply, fill #2

## 2023-03-19 MED ORDER — HYDROXYZINE HCL 10 MG PO TABS
10.0000 mg | ORAL_TABLET | Freq: Three times a day (TID) | ORAL | 3 refills | Status: DC | PRN
Start: 2023-03-19 — End: 2023-06-19
  Filled 2023-03-19: qty 90, 30d supply, fill #0

## 2023-03-19 MED ORDER — QUETIAPINE FUMARATE 300 MG PO TABS
300.0000 mg | ORAL_TABLET | Freq: Every day | ORAL | 3 refills | Status: DC
Start: 2023-03-19 — End: 2023-06-19
  Filled 2023-03-19: qty 30, 30d supply, fill #0
  Filled 2023-05-08: qty 30, 30d supply, fill #1
  Filled 2023-06-17: qty 30, 30d supply, fill #2

## 2023-03-19 NOTE — Progress Notes (Signed)
BH MD/PA/NP OP Progress Note Virtual Visit via Video Note  I connected with Victoria Snyder on 03/19/23 at  8:30 AM EDT by a video enabled telemedicine application and verified that I am speaking with the correct person using two identifiers.  Location: Patient: Doctor Office Provider: Clinic   I discussed the limitations of evaluation and management by telemedicine and the availability of in person appointments. The patient expressed understanding and agreed to proceed.  I provided 30 minutes of non-face-to-face time during this encounter.         03/19/2023 8:47 AM Victoria Snyder  MRN:  [id number]  Chief Complaint:  "I have been trying to stay focused"   HPI: 57 year old female seen today for follow up psychiatric evaluation. She has a  psychiatric history of Bipolar 2, anxiety, and depression. She is currently being managed on Seroquel 300 mg nighty, Trazodone 100 mg nightly, Effexor 75 mg daily,  Buspar 15 mg three times daily, Hydroxyzine 10 mg three times daily as needed and gabapentin 400 mg 3 times daily (reports taking 2-3 times daily).  She informed Clinical research associate that her medication are effective in managing her psychiatric conditions.   Today she was well-groomed, pleasant, cooperative, engaged in conversation, and maintained eye contact.  She informed Clinical research associate that she has been trying to stay focused.  She informed Clinical research associate that her ex-husband has been in and out of court disputing marital property.  Patient informed Clinical research associate that physically her body continues to ache.  She quantifies her back and leg pain as 5 out of 10.  She notes that currently she is at the doctor's office awaiting to get an injection to help manage this pain.   Since her last visit she informed writer that her mood continues to be stable and reports that she has minimal anxiety and depression.  Today provider conducted GAD-7 and patient scored a 10, at her last visit she scored an 16.  Provider also conducted PHQ-9  patient scored a 8, at her last visit she scored an 13.  She endorses adequate sleep and appetite.  Today she endorses passive SI but denies wanting to harm herself.  She denies SI/HI/VAH, mania, paranoia.  Patient informed Clinical research associate that she is looking forward to spending the weekend with her grandchildren, son, and stepdaughter.  No medication changes made today.  Patient agreeable to continue medications as prescribed.  No other concerns noted at this time.  Concerns noted at this time.       Visit Diagnosis:    ICD-10-CM   1. Bipolar affective disorder, currently depressed, mild (HCC)  F31.31 gabapentin (NEURONTIN) 400 MG capsule    QUEtiapine (SEROQUEL) 300 MG tablet    2. Generalized anxiety disorder  F41.1 busPIRone (BUSPAR) 15 MG tablet    gabapentin (NEURONTIN) 400 MG capsule    hydrOXYzine (ATARAX) 10 MG tablet    QUEtiapine (SEROQUEL) 300 MG tablet    traZODone (DESYREL) 100 MG tablet    venlafaxine XR (EFFEXOR-XR) 75 MG 24 hr capsule       Past Psychiatric History: anxiety, depression, insomnia, and bipolar 2 disorder. Past Medical History:  Past Medical History:  Diagnosis Date   Anxiety    Arthritis    Bipolar disorder (HCC)    Blood transfusion without reported diagnosis    Hypertension    Hypokalemia    Low back pain with radiation    Vertigo     Past Surgical History:  Procedure Laterality Date   ABDOMINAL HYSTERECTOMY  partial for fibroids   COLONOSCOPY  10/18/2022   EXPLORATORY LAPAROTOMY  2011   mass in abdomen removed-uterine leiomyomata   TOTAL HIP ARTHROPLASTY Right 01/30/2022   Procedure: RIGHT TOTAL HIP ARTHROPLASTY ANTERIOR APPROACH;  Surgeon: Eldred Manges, MD;  [facility]: [facility];  Service: Orthopedics;  Laterality: Right;    Family Psychiatric History: Notes that her half brother had a mental illness however she is unaware of what it was.     Family History:  Family History  Problem Relation Age of Onset   Heart disease Mother         heart attack   Stroke Father    Breast cancer Neg Hx     Social History:  Social History   Socioeconomic History   Marital status: Single    Spouse name: Not on file   Number of children: 1   Years of education: Not on file   Highest education level: 12th grade  Occupational History   Occupation: Unemployed  Tobacco Use   Smoking status: Former    Current packs/day: 0.00    Types: Cigarettes    Quit date: 2001    Years since quitting: 23.8   Smokeless tobacco: Never  Vaping Use   Vaping status: Never Used  Substance and Sexual Activity   Alcohol use: Yes    Comment: Drinks occasionally   Drug use: No   Sexual activity: Yes    Birth control/protection: Surgical, Post-menopausal  Other Topics Concern   Not on file  Social History Narrative   Separated.  1 child   Not working.    Social Determinants of Health   Financial Resource Strain: Not on file  Food Insecurity: No Food Insecurity (05/03/2021)   Hunger Vital Sign    Worried About Running Out of Food in the Last Year: Never true    Ran Out of Food in the Last Year: Never true  Transportation Needs: No Transportation Needs (05/03/2021)   PRAPARE - Administrator, Civil Service (Medical): No    Lack of Transportation (Non-Medical): No  Physical Activity: Not on file  Stress: Not on file  Social Connections: Not on file    Allergies: No Known Allergies  Metabolic Disorder Labs: Lab Results  Component Value Date   HGBA1C 5.2 06/06/2021   No results found for: "PROLACTIN" Lab Results  Component Value Date   CHOL 190 12/24/2022   TRIG 96.0 12/24/2022   HDL 83.90 12/24/2022   CHOLHDL 2 12/24/2022   VLDL 19.2 12/24/2022   LDLCALC 87 12/24/2022   LDLCALC 105 (H) 06/04/2021   Lab Results  Component Value Date   TSH 1.04 06/04/2021   TSH 1.840 10/22/2017    Therapeutic Level Labs: No results found for: "LITHIUM" No results found for: "VALPROATE" No results found for: "CBMZ"  Current  Medications: Current Outpatient Medications  Medication Sig Dispense Refill   amLODipine (NORVASC) 5 MG tablet Take 1 tablet (5 mg total) by mouth daily. 90 tablet 3   Ascorbic Acid (VITAMIN C) 1000 MG tablet Take 1,000 mg by mouth daily.     busPIRone (BUSPAR) 15 MG tablet Take 1 tablet (15 mg total) by mouth 3 (three) times daily. 90 tablet 3   gabapentin (NEURONTIN) 400 MG capsule Take 1 capsule (400 mg total) by mouth 3 (three) times daily. To help with back pain with radiation 90 capsule 3   hydrOXYzine (ATARAX) 10 MG tablet Take 1 tablet (10 mg total) by mouth 3 (three) times  daily as needed for anxiety. 90 tablet 3   Multiple Vitamins-Minerals (MULTIVITAMIN WITH MINERALS) tablet Take 1 tablet by mouth daily.     OVER THE COUNTER MEDICATION Sea moss     QUEtiapine (SEROQUEL) 300 MG tablet Take 1 tablet (300 mg total) by mouth at bedtime. 30 tablet 3   traZODone (DESYREL) 100 MG tablet Take 1 tablet (100 mg total) by mouth at bedtime as needed for sleep. 30 tablet 3   venlafaxine XR (EFFEXOR-XR) 75 MG 24 hr capsule Take 1 capsule (75 mg total) by mouth daily. 30 capsule 3   zinc gluconate 50 MG tablet Take 1 tablet (50 mg total) by mouth daily. 30 tablet 0   No current facility-administered medications for this visit.     Musculoskeletal: Strength & Muscle Tone: within normal limits and telehealth visit Gait & Station: normal, telehealth visit Patient leans: N/A  Psychiatric Specialty Exam: Review of Systems  There were no vitals taken for this visit.There is no height or weight on file to calculate BMI.  General Appearance: Well Groomed  Eye Contact:  Good  Speech:  Clear and Coherent and Normal Rate  Volume:  Normal  Mood:  Euthymic  Affect:  Congruent  Thought Process:  Coherent, Goal Directed and Linear  Orientation:  Full (Time, Place, and Person)  Thought Content: WDL and Logical   Suicidal Thoughts:  Yes.  without intent/plan  Homicidal Thoughts:  No  Memory:   Immediate;   Good Recent;   Good Remote;   Good  Judgement:  Good  Insight:  Good  Psychomotor Activity:  Normal  Concentration:  Concentration: Good and Attention Span: Good  Recall:  Good  Fund of Knowledge: Good  Language: Good  Akathisia:  No  Handed:  Right  AIMS (if indicated): Not done  Assets:  Communication Skills Desire for Improvement Financial Resources/Insurance Housing Social Support  ADL's:  Intact  Cognition: WNL  Sleep:  Good   Screenings: GAD-7    Flowsheet Row Video Visit from 03/19/2023 in [facility] Office Visit from 12/18/2022 in [facility] [place] Video Visit from 12/04/2022 in [facility] Video Visit from 09/25/2022 in [facility] Video Visit from 07/03/2022 in [facility]  Total GAD-7 Score 10 5 16 11 13       PHQ2-9    Flowsheet Row Video Visit from 03/19/2023 in [facility] Office Visit from 12/18/2022 in [facility] [place] Video Visit from 12/04/2022 in [facility] Video Visit from 09/25/2022 in [facility] Video Visit from 07/03/2022 in [facility]  PHQ-2 Total Score 2 3 3 3 4   PHQ-9 Total Score 8 6 13 11 17       Flowsheet Row Video Visit from 03/19/2023 in [facility] Video Visit from 12/04/2022 in [facility] Video Visit from 09/25/2022 in [facility]  C-SSRS RISK CATEGORY Error: Q3, 4, or 5 should not be populated when Q2 is No Error: Q7 should not be populated when Q6 is No Error: Q7 should not be populated when Q6 is No        Assessment and Plan: Patient notes that she is doing well on her current medication regimen.  No medication changes made today.  Patient agreeable to continue  medications as prescribed.  1. Bipolar affective disorder, currently  depressed, mild (HCC)  Continue- gabapentin (NEURONTIN) 400 MG capsule; Take 1 capsule (400 mg total) by mouth 3 (three) times daily. To help with back pain with radiation  Dispense: 90 capsule; Refill: 3 Continue- QUEtiapine (SEROQUEL) 300 MG tablet; Take 1 tablet (300 mg total) by mouth at bedtime.  Dispense: 30 tablet; Refill: 3  2. Generalized anxiety disorder  Continue- busPIRone (BUSPAR) 15 MG tablet; Take 1 tablet (15 mg total) by mouth 3 (three) times daily.  Dispense: 90 tablet; Refill: 3 Continue- gabapentin (NEURONTIN) 400 MG capsule; Take 1 capsule (400 mg total) by mouth 3 (three) times daily. To help with back pain with radiation  Dispense: 90 capsule; Refill: 3 Continue- hydrOXYzine (ATARAX) 10 MG tablet; Take 1 tablet (10 mg total) by mouth 3 (three) times daily as needed for anxiety.  Dispense: 90 tablet; Refill: 3 Continue- QUEtiapine (SEROQUEL) 300 MG tablet; Take 1 tablet (300 mg total) by mouth at bedtime.  Dispense: 30 tablet; Refill: 3 Continue- traZODone (DESYREL) 100 MG tablet; Take 1 tablet (100 mg total) by mouth at bedtime as needed for sleep.  Dispense: 30 tablet; Refill: 3 Continue- venlafaxine XR (EFFEXOR-XR) 75 MG 24 hr capsule; Take 1 capsule (75 mg total) by mouth daily.  Dispense: 30 capsule; Refill: 3    Follow-up in 2.5 months Follow-up with therapy   Shanna Cisco, NP 03/19/2023, 8:47 AM

## 2023-03-24 ENCOUNTER — Ambulatory Visit: Payer: 59 | Admitting: Internal Medicine

## 2023-03-27 ENCOUNTER — Other Ambulatory Visit: Payer: Self-pay

## 2023-03-31 ENCOUNTER — Other Ambulatory Visit: Payer: Self-pay

## 2023-04-03 DIAGNOSIS — M5416 Radiculopathy, lumbar region: Secondary | ICD-10-CM | POA: Diagnosis not present

## 2023-04-11 ENCOUNTER — Encounter (HOSPITAL_COMMUNITY): Payer: Self-pay

## 2023-04-11 ENCOUNTER — Ambulatory Visit (HOSPITAL_COMMUNITY): Payer: 59 | Admitting: Clinical

## 2023-05-06 ENCOUNTER — Other Ambulatory Visit: Payer: Self-pay

## 2023-05-06 DIAGNOSIS — M5451 Vertebrogenic low back pain: Secondary | ICD-10-CM | POA: Diagnosis not present

## 2023-05-06 DIAGNOSIS — M5116 Intervertebral disc disorders with radiculopathy, lumbar region: Secondary | ICD-10-CM | POA: Diagnosis not present

## 2023-05-06 DIAGNOSIS — G8929 Other chronic pain: Secondary | ICD-10-CM | POA: Diagnosis not present

## 2023-05-06 DIAGNOSIS — M5416 Radiculopathy, lumbar region: Secondary | ICD-10-CM | POA: Diagnosis not present

## 2023-05-06 MED ORDER — PREDNISONE 10 MG PO TABS
ORAL_TABLET | ORAL | 0 refills | Status: AC
Start: 2023-05-06 — End: 2023-05-14
  Filled 2023-05-06: qty 21, 6d supply, fill #0

## 2023-05-08 ENCOUNTER — Other Ambulatory Visit: Payer: Self-pay

## 2023-06-04 ENCOUNTER — Encounter (HOSPITAL_COMMUNITY): Payer: Self-pay

## 2023-06-04 ENCOUNTER — Other Ambulatory Visit: Payer: Self-pay

## 2023-06-04 ENCOUNTER — Telehealth (HOSPITAL_COMMUNITY): Payer: Medicaid Other | Admitting: Psychiatry

## 2023-06-04 DIAGNOSIS — G8929 Other chronic pain: Secondary | ICD-10-CM | POA: Diagnosis not present

## 2023-06-04 DIAGNOSIS — M5451 Vertebrogenic low back pain: Secondary | ICD-10-CM | POA: Diagnosis not present

## 2023-06-04 DIAGNOSIS — M5416 Radiculopathy, lumbar region: Secondary | ICD-10-CM | POA: Diagnosis not present

## 2023-06-04 DIAGNOSIS — M5116 Intervertebral disc disorders with radiculopathy, lumbar region: Secondary | ICD-10-CM | POA: Diagnosis not present

## 2023-06-04 MED ORDER — PREDNISONE 10 MG (21) PO TBPK
ORAL_TABLET | ORAL | 0 refills | Status: DC
  Filled 2023-06-04 – 2023-06-17 (×2): qty 21, 6d supply, fill #0

## 2023-06-16 ENCOUNTER — Other Ambulatory Visit: Payer: Self-pay

## 2023-06-17 ENCOUNTER — Other Ambulatory Visit: Payer: Self-pay

## 2023-06-19 ENCOUNTER — Other Ambulatory Visit: Payer: Self-pay

## 2023-06-19 ENCOUNTER — Encounter (HOSPITAL_COMMUNITY): Payer: Self-pay | Admitting: Psychiatry

## 2023-06-19 ENCOUNTER — Telehealth (INDEPENDENT_AMBULATORY_CARE_PROVIDER_SITE_OTHER): Payer: Medicaid Other | Admitting: Psychiatry

## 2023-06-19 DIAGNOSIS — F411 Generalized anxiety disorder: Secondary | ICD-10-CM | POA: Diagnosis not present

## 2023-06-19 DIAGNOSIS — F3131 Bipolar disorder, current episode depressed, mild: Secondary | ICD-10-CM | POA: Diagnosis not present

## 2023-06-19 MED ORDER — HYDROXYZINE HCL 10 MG PO TABS
10.0000 mg | ORAL_TABLET | Freq: Three times a day (TID) | ORAL | 3 refills | Status: DC | PRN
  Filled 2023-06-19 – 2023-08-28 (×2): qty 90, 30d supply, fill #0

## 2023-06-19 MED ORDER — GABAPENTIN 400 MG PO CAPS
400.0000 mg | ORAL_CAPSULE | Freq: Three times a day (TID) | ORAL | 3 refills | Status: DC
  Filled 2023-06-19: qty 90, 30d supply, fill #0

## 2023-06-19 MED ORDER — TRAZODONE HCL 100 MG PO TABS
100.0000 mg | ORAL_TABLET | Freq: Every evening | ORAL | 3 refills | Status: DC | PRN
  Filled 2023-06-19 – 2023-08-28 (×2): qty 30, 30d supply, fill #0

## 2023-06-19 MED ORDER — VENLAFAXINE HCL ER 75 MG PO CP24
75.0000 mg | ORAL_CAPSULE | Freq: Every day | ORAL | 3 refills | Status: DC
  Filled 2023-06-19 – 2023-08-28 (×2): qty 30, 30d supply, fill #0

## 2023-06-19 MED ORDER — BUSPIRONE HCL 15 MG PO TABS
15.0000 mg | ORAL_TABLET | Freq: Three times a day (TID) | ORAL | 3 refills | Status: DC
  Filled 2023-06-19 – 2023-08-28 (×2): qty 90, 30d supply, fill #0

## 2023-06-19 MED ORDER — QUETIAPINE FUMARATE 300 MG PO TABS
300.0000 mg | ORAL_TABLET | Freq: Every day | ORAL | 3 refills | Status: DC
  Filled 2023-06-19 – 2023-08-28 (×2): qty 30, 30d supply, fill #0

## 2023-06-19 NOTE — Progress Notes (Signed)
BH MD/PA/NP OP Progress Note Virtual Visit via Video Note  I connected with Victoria Snyder on 06/19/23 at  2:30 PM EST by a video enabled telemedicine application and verified that I am speaking with the correct person using two identifiers.  Location: Patient: Doctor Office Provider: Clinic   I discussed the limitations of evaluation and management by telemedicine and the availability of in person appointments. The patient expressed understanding and agreed to proceed.  I provided 30 minutes of non-face-to-face time during this encounter.         06/19/2023 2:51 PM Victoria Snyder  MRN:  [id number]  Chief Complaint:  "I have been stressed"   HPI: 58 year old female seen today for follow up psychiatric evaluation. She has a  psychiatric history of Bipolar 2, anxiety, and depression. She is currently being managed on Seroquel 300 mg nighty, Trazodone 100 mg nightly, Effexor 75 mg daily,  Buspar 15 mg three times daily, Hydroxyzine 10 mg three times daily as needed and gabapentin 400 mg 3 times daily (reports taking 2-3 times daily).  She informed Clinical research associate that her medication are effective in managing her psychiatric conditions.   Today she was well-groomed, pleasant, cooperative, engaged in conversation, and maintained eye contact.  She informed Clinical research associate that she continues to be stressed. She notes she continues to have issues with her ex husband about marital properties. She notes that she continues to pursue it legally and notes that she is going to court later this month. Patient notes that this stressor is exacerbating her physical health. She notes that she continues to have leg pain, headaches, and increases in her blood pressure. She notes that she follows up with her primary care doctor soon to address her blood pressure. She also notes that she will follow up with [facility] in February to address her pain. Today she quantifies her pain as 7/10.  Patient notes that the above worsens  her anxiety and depression. She note that she copes by eating. Since her last visit she notes that she has gained 10-12 pounds. Today provider conducted GAD-7 and patient scored a 19, at her last visit she scored an 10.  Provider also conducted PHQ-9 patient scored a 21, at her last visit she scored an 8.  She endorses fair sleep (4 to 5 hours) and increased appetite.  Today she denies SI/HI/VAH, mania, paranoia.  Patient reports that the above is situational and request that her medications not be adjusted.  No medication changes made today.  Patient agreeable to continue medications as prescribed.  No other concerns noted at this time.       Visit Diagnosis:    ICD-10-CM   1. Generalized anxiety disorder  F41.1 busPIRone (BUSPAR) 15 MG tablet    gabapentin (NEURONTIN) 400 MG capsule    hydrOXYzine (ATARAX) 10 MG tablet    venlafaxine XR (EFFEXOR-XR) 75 MG 24 hr capsule    traZODone (DESYREL) 100 MG tablet    QUEtiapine (SEROQUEL) 300 MG tablet    2. Bipolar affective disorder, currently depressed, mild (HCC)  F31.31 gabapentin (NEURONTIN) 400 MG capsule    QUEtiapine (SEROQUEL) 300 MG tablet        Past Psychiatric History: anxiety, depression, insomnia, and bipolar 2 disorder. Past Medical History:  Past Medical History:  Diagnosis Date   Anxiety    Arthritis    Bipolar disorder (HCC)    Blood transfusion without reported diagnosis    Hypertension    Hypokalemia  Low back pain with radiation    Vertigo     Past Surgical History:  Procedure Laterality Date   ABDOMINAL HYSTERECTOMY     partial for fibroids   COLONOSCOPY  10/18/2022   EXPLORATORY LAPAROTOMY  2011   mass in abdomen removed-uterine leiomyomata   TOTAL HIP ARTHROPLASTY Right 01/30/2022   Procedure: RIGHT TOTAL HIP ARTHROPLASTY ANTERIOR APPROACH;  Surgeon: Eldred Manges, MD;  Location: [facility] OR;  Service: Orthopedics;  Laterality: Right;    Family Psychiatric History: Notes that her half brother had a  mental illness however she is unaware of what it was.     Family History:  Family History  Problem Relation Age of Onset   Heart disease Mother        heart attack   Stroke Father    Breast cancer Neg Hx     Social History:  Social History   Socioeconomic History   Marital status: Single    Spouse name: Not on file   Number of children: 1   Years of education: Not on file   Highest education level: 12th grade  Occupational History   Occupation: Unemployed  Tobacco Use   Smoking status: Former    Current packs/day: 0.00    Types: Cigarettes    Quit date: 2001    Years since quitting: 24.0   Smokeless tobacco: Never  Vaping Use   Vaping status: Never Used  Substance and Sexual Activity   Alcohol use: Yes    Comment: Drinks occasionally   Drug use: No   Sexual activity: Yes    Birth control/protection: Surgical, Post-menopausal  Other Topics Concern   Not on file  Social History Narrative   Separated.  1 child   Not working.    Social Drivers of Corporate investment banker Strain: Not on file  Food Insecurity: No Food Insecurity (05/03/2021)   Hunger Vital Sign    Worried About Running Out of Food in the Last Year: Never true    Ran Out of Food in the Last Year: Never true  Transportation Needs: No Transportation Needs (05/03/2021)   PRAPARE - Administrator, Civil Service (Medical): No    Lack of Transportation (Non-Medical): No  Physical Activity: Not on file  Stress: Not on file  Social Connections: Not on file    Allergies: No Known Allergies  Metabolic Disorder Labs: Lab Results  Component Value Date   HGBA1C 5.2 06/06/2021   No results found for: "PROLACTIN" Lab Results  Component Value Date   CHOL 190 12/24/2022   TRIG 96.0 12/24/2022   HDL 83.90 12/24/2022   CHOLHDL 2 12/24/2022   VLDL 19.2 12/24/2022   LDLCALC 87 12/24/2022   LDLCALC 105 (H) 06/04/2021   Lab Results  Component Value Date   TSH 1.04 06/04/2021   TSH 1.840  10/22/2017    Therapeutic Level Labs: No results found for: "LITHIUM" No results found for: "VALPROATE" No results found for: "CBMZ"  Current Medications: Current Outpatient Medications  Medication Sig Dispense Refill   amLODipine (NORVASC) 5 MG tablet Take 1 tablet (5 mg total) by mouth daily. 90 tablet 3   Ascorbic Acid (VITAMIN C) 1000 MG tablet Take 1,000 mg by mouth daily.     busPIRone (BUSPAR) 15 MG tablet Take 1 tablet (15 mg total) by mouth 3 (three) times daily. 90 tablet 3   gabapentin (NEURONTIN) 400 MG capsule Take 1 capsule (400 mg total) by mouth 3 (three) times daily.  To help with back pain with radiation 90 capsule 3   hydrOXYzine (ATARAX) 10 MG tablet Take 1 tablet (10 mg total) by mouth 3 (three) times daily as needed for anxiety. 90 tablet 3   Multiple Vitamins-Minerals (MULTIVITAMIN WITH MINERALS) tablet Take 1 tablet by mouth daily.     OVER THE COUNTER MEDICATION Sea moss     predniSONE (STERAPRED UNI-PAK 21 TAB) 10 MG (21) TBPK tablet Take as directed for 6 days 21 each 0   QUEtiapine (SEROQUEL) 300 MG tablet Take 1 tablet (300 mg total) by mouth at bedtime. 30 tablet 3   traZODone (DESYREL) 100 MG tablet Take 1 tablet (100 mg total) by mouth at bedtime as needed for sleep. 30 tablet 3   venlafaxine XR (EFFEXOR-XR) 75 MG 24 hr capsule Take 1 capsule (75 mg total) by mouth daily. 30 capsule 3   zinc gluconate 50 MG tablet Take 1 tablet (50 mg total) by mouth daily. 30 tablet 0   No current facility-administered medications for this visit.     Musculoskeletal: Strength & Muscle Tone: within normal limits and telehealth visit Gait & Station: normal, telehealth visit Patient leans: N/A  Psychiatric Specialty Exam: Review of Systems  There were no vitals taken for this visit.There is no height or weight on file to calculate BMI.  General Appearance: Well Groomed  Eye Contact:  Good  Speech:  Clear and Coherent and Normal Rate  Volume:  Normal  Mood:  Anxious  and Depressed  Affect:  Congruent  Thought Process:  Coherent, Goal Directed and Linear  Orientation:  Full (Time, Place, and Person)  Thought Content: WDL and Logical   Suicidal Thoughts:  No  Homicidal Thoughts:  No  Memory:  Immediate;   Good Recent;   Good Remote;   Good  Judgement:  Good  Insight:  Good  Psychomotor Activity:  Normal  Concentration:  Concentration: Good and Attention Span: Good  Recall:  Good  Fund of Knowledge: Good  Language: Good  Akathisia:  No  Handed:  Right  AIMS (if indicated): Not done  Assets:  Communication Skills Desire for Improvement Financial Resources/Insurance Housing Social Support  ADL's:  Intact  Cognition: WNL  Sleep:  Fair   Screenings: GAD-7    Flowsheet Row Video Visit from 06[facility]19/2023 in [facility] Video Visit from 03/19/2023 in [facility] Office Visit from 12/18/2022 in [facility] [place] Video Visit from 12/04/2022 in [facility] Video Visit from 09/25/2022 in [facility]  Total GAD-7 Score 19 10 5 16 11       PHQ2-9    Flowsheet Row Video Visit from 06/19/2023 in [facility] Video Visit from 03/19/2023 in [facility] Office Visit from 12/18/2022 in [facility] [place] Video Visit from 12/04/2022 in [facility] Video Visit from 09/25/2022 in [facility]  PHQ-2 Total Score 6 2 3 3 3   PHQ-9 Total Score 21 8 6 13 11       Flowsheet Row Video Visit from 03/19/2023 in [facility] Video Visit from 12/04/2022 in [facility] Video Visit from 09/25/2022 in [facility]  C-SSRS RISK CATEGORY Error: Q3, 4, or 5 should not be populated when Q2 is No Error: Q7 should not be populated  when Q6 is No Error: Q7 should not  be populated when Q6 is No        Assessment and Plan: Patient endorses increased anxiety, depression, and poor sleep due to situational stressors.Patient reports that she is able to cope with her situational stressors and request that her medications not be adjusted.  No medication changes made today.  Patient agreeable to continue medications as prescribed.  1. Generalized anxiety disorder  Continue- busPIRone (BUSPAR) 15 MG tablet; Take 1 tablet (15 mg total) by mouth 3 (three) times daily.  Dispense: 90 tablet; Refill: 3 Continue- gabapentin (NEURONTIN) 400 MG capsule; Take 1 capsule (400 mg total) by mouth 3 (three) times daily. To help with back pain with radiation  Dispense: 90 capsule; Refill: 3 Continue- hydrOXYzine (ATARAX) 10 MG tablet; Take 1 tablet (10 mg total) by mouth 3 (three) times daily as needed for anxiety.  Dispense: 90 tablet; Refill: 3 Continue- venlafaxine XR (EFFEXOR-XR) 75 MG 24 hr capsule; Take 1 capsule (75 mg total) by mouth daily.  Dispense: 30 capsule; Refill: 3 Continue- traZODone (DESYREL) 100 MG tablet; Take 1 tablet (100 mg total) by mouth at bedtime as needed for sleep.  Dispense: 30 tablet; Refill: 3 Continue- QUEtiapine (SEROQUEL) 300 MG tablet; Take 1 tablet (300 mg total) by mouth at bedtime.  Dispense: 30 tablet; Refill: 3  2. Bipolar affective disorder, currently depressed, mild (HCC)  Continue- gabapentin (NEURONTIN) 400 MG capsule; Take 1 capsule (400 mg total) by mouth 3 (three) times daily. To help with back pain with radiation  Dispense: 90 capsule; Refill: 3 Continue- QUEtiapine (SEROQUEL) 300 MG tablet; Take 1 tablet (300 mg total) by mouth at bedtime.  Dispense: 30 tablet; Refill: 3   Follow-up in 2.5 months Follow-up with therapy   Shanna Cisco, NP 06/19/2023, 2:51 PM

## 2023-07-01 ENCOUNTER — Other Ambulatory Visit: Payer: Self-pay

## 2023-07-22 ENCOUNTER — Other Ambulatory Visit: Payer: Self-pay

## 2023-08-01 DIAGNOSIS — I10 Essential (primary) hypertension: Secondary | ICD-10-CM | POA: Diagnosis not present

## 2023-08-01 DIAGNOSIS — F17211 Nicotine dependence, cigarettes, in remission: Secondary | ICD-10-CM | POA: Diagnosis not present

## 2023-08-01 DIAGNOSIS — Z008 Encounter for other general examination: Secondary | ICD-10-CM | POA: Diagnosis not present

## 2023-08-28 ENCOUNTER — Other Ambulatory Visit: Payer: Self-pay

## 2023-09-01 ENCOUNTER — Other Ambulatory Visit: Payer: Self-pay

## 2023-09-03 ENCOUNTER — Encounter (HOSPITAL_COMMUNITY): Payer: Self-pay | Admitting: Psychiatry

## 2023-09-03 ENCOUNTER — Other Ambulatory Visit: Payer: Self-pay

## 2023-09-03 ENCOUNTER — Telehealth (INDEPENDENT_AMBULATORY_CARE_PROVIDER_SITE_OTHER): Payer: No Typology Code available for payment source | Admitting: Psychiatry

## 2023-09-03 DIAGNOSIS — F411 Generalized anxiety disorder: Secondary | ICD-10-CM

## 2023-09-03 DIAGNOSIS — F3131 Bipolar disorder, current episode depressed, mild: Secondary | ICD-10-CM

## 2023-09-03 MED ORDER — VENLAFAXINE HCL ER 75 MG PO CP24
75.0000 mg | ORAL_CAPSULE | Freq: Every day | ORAL | 3 refills | Status: DC
  Filled 2023-09-03 – 2023-10-01 (×2): qty 30, 30d supply, fill #0

## 2023-09-03 MED ORDER — BUSPIRONE HCL 15 MG PO TABS
15.0000 mg | ORAL_TABLET | Freq: Three times a day (TID) | ORAL | 3 refills | Status: DC
  Filled 2023-09-03 – 2023-10-01 (×2): qty 90, 30d supply, fill #0

## 2023-09-03 MED ORDER — GABAPENTIN 400 MG PO CAPS
400.0000 mg | ORAL_CAPSULE | Freq: Three times a day (TID) | ORAL | 3 refills | Status: DC
  Filled 2023-09-03 – 2023-10-01 (×2): qty 90, 30d supply, fill #0

## 2023-09-03 MED ORDER — HYDROXYZINE HCL 25 MG PO TABS
25.0000 mg | ORAL_TABLET | Freq: Three times a day (TID) | ORAL | 3 refills | Status: DC | PRN
  Filled 2023-09-03 – 2023-09-17 (×2): qty 90, 30d supply, fill #0
  Filled 2023-10-01: qty 90, 30d supply, fill #1

## 2023-09-03 MED ORDER — TRAZODONE HCL 150 MG PO TABS
150.0000 mg | ORAL_TABLET | Freq: Every evening | ORAL | 3 refills | Status: DC | PRN
  Filled 2023-09-03 – 2023-09-17 (×2): qty 30, 30d supply, fill #0
  Filled 2023-10-01: qty 30, 30d supply, fill #1

## 2023-09-03 MED ORDER — QUETIAPINE FUMARATE 300 MG PO TABS
300.0000 mg | ORAL_TABLET | Freq: Every day | ORAL | 3 refills | Status: DC
  Filled 2023-09-03 – 2023-10-01 (×2): qty 30, 30d supply, fill #0
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
## 2023-09-12 ENCOUNTER — Other Ambulatory Visit: Payer: Self-pay

## 2023-09-17 ENCOUNTER — Other Ambulatory Visit: Payer: Self-pay

## 2023-10-01 ENCOUNTER — Other Ambulatory Visit: Payer: Self-pay

## 2023-10-01 ENCOUNTER — Ambulatory Visit (INDEPENDENT_AMBULATORY_CARE_PROVIDER_SITE_OTHER): Admitting: Physician Assistant

## 2023-10-01 ENCOUNTER — Ambulatory Visit (HOSPITAL_COMMUNITY)
Admission: RE | Admit: 2023-10-01 | Discharge: 2023-10-01 | Disposition: A | Discharge status: Home / Self Care | Source: Ambulatory Visit | Attending: Physician Assistant | Admitting: Physician Assistant

## 2023-10-01 ENCOUNTER — Ambulatory Visit (INDEPENDENT_AMBULATORY_CARE_PROVIDER_SITE_OTHER): Payer: Self-pay

## 2023-10-01 DIAGNOSIS — G8929 Other chronic pain: Secondary | ICD-10-CM

## 2023-10-01 DIAGNOSIS — M25561 Pain in right knee: Secondary | ICD-10-CM

## 2023-10-01 DIAGNOSIS — M79661 Pain in right lower leg: Secondary | ICD-10-CM | POA: Diagnosis not present

## 2023-10-01 NOTE — Progress Notes (Signed)
Called and LMOM for patient.  

## 2023-10-01 NOTE — Progress Notes (Signed)
 Can you let patient know that I would recommend rest and ice a few times per day.  Compression sleeve/ace wrap would be good as well.  Follow up in 3 weeks for recheck

## 2023-10-01 NOTE — Progress Notes (Signed)
 Office Visit Note   Patient: Victoria Snyder           Date of Birth: May 28, 1965           MRN: [id number] Visit Date: 10/01/2023              Requested by: Aida House, MD [address] [place],  [place] [address] PCP: Aida House, MD   Assessment & Plan: Visit Diagnoses:  1. Chronic pain of right knee   2. Pain in right lower leg     Plan: Impression is right calf pain concerning for DVT/possible ruptured Baker's cyst given she has underlying right knee OA.  At this point, would like to order a venous Doppler ultrasound right lower extremity.  She will follow-up with us  as needed.  Follow-Up Instructions: Return if symptoms worsen or fail to improve.   Orders:  Orders Placed This Encounter  Procedures   XR KNEE 3 VIEW RIGHT   VAS US  LOWER EXTREMITY VENOUS (DVT)   No orders of the defined types were placed in this encounter.     Procedures: No procedures performed   Clinical Data: No additional findings.   Subjective: Chief Complaint  Patient presents with   Right Knee - Pain    HPI patient is a very pleasant 58 year old female who comes in today with right calf pain.  Symptoms started about 3 days ago after dancing with her grandkids.  She initially had some slight swelling and pain into the knee which soon traveled into the calf.  She is no longer having any knee pain.  All of her pain is localized to the proximal calf.  Symptoms are constant but worse when she is walking or sitting.  She has been taking Tylenol  and using BIOflex without relief.  No previous history of DVT/PE.  She is a non-smoker.  She is not on any hormone replacement medication.  No chest pain or shortness of breath.  Review of Systems as detailed in HPI.  All others reviewed and are negative.   Objective: Vital Signs: There were no vitals taken for this visit.  Physical Exam well-developed well-nourished female in no acute distress.  Alert and oriented x 3.  Ortho  Exam right lower extremity exam: Trace effusion to the right knee.  Range of motion 0 to 120 degrees.  No joint line tenderness.  She has significant tenderness to the right calf.  She does have mild swelling the right lower extremity.  Negative Homans.  She is neurovascularly intact distally.  Specialty Comments:  No specialty comments available.  Imaging: XR KNEE 3 VIEW RIGHT Result Date: 10/01/2023 Mild tricompartment degenerative changes    PMFS History: Patient Active Problem List   Diagnosis Date Noted   Status post total hip replacement, right 01/30/2022   Cervicalgia 07/13/2021   DDD (degenerative disc disease), lumbar 12/17/2018   Spinal stenosis of lumbar region 12/17/2018   Essential hypertension 03/31/2018   Low back pain with radiation 03/31/2018   History of vertigo 03/31/2018   Anxiety and depression 03/31/2018   Bipolar disorder (HCC) 03/31/2018   Past Medical History:  Diagnosis Date   Anxiety    Arthritis    Bipolar disorder (HCC)    Blood transfusion without reported diagnosis    Hypertension    Hypokalemia    Low back pain with radiation    Vertigo     Family History  Problem Relation Age of Onset   Heart disease Mother  heart attack   Stroke Father    Breast cancer Neg Hx     Past Surgical History:  Procedure Laterality Date   ABDOMINAL HYSTERECTOMY     partial for fibroids   COLONOSCOPY  10/18/2022   EXPLORATORY LAPAROTOMY  2011   mass in abdomen removed-uterine leiomyomata   TOTAL HIP ARTHROPLASTY Right 01/30/2022   Procedure: RIGHT TOTAL HIP ARTHROPLASTY ANTERIOR APPROACH;  Surgeon: Adah Acron, MD;  Location: [facility] OR;  Service: Orthopedics;  Laterality: Right;   Social History   Occupational History   Occupation: Unemployed  Tobacco Use   Smoking status: Former    Current packs/day: 0.00    Types: Cigarettes    Quit date: 2001    Years since quitting: 24.3   Smokeless tobacco: Never  Vaping Use   Vaping status: Never Used   Substance and Sexual Activity   Alcohol use: Yes    Comment: Drinks occasionally   Drug use: No   Sexual activity: Yes    Birth control/protection: Surgical, Post-menopausal

## 2023-10-02 ENCOUNTER — Other Ambulatory Visit: Payer: Self-pay

## 2023-10-10 ENCOUNTER — Ambulatory Visit

## 2023-10-10 VITALS — BP 120/60 | HR 87 | Temp 98.1°F | Ht 69.0 in | Wt 161.0 lb

## 2023-10-10 DIAGNOSIS — Z0000 Encounter for general adult medical examination without abnormal findings: Secondary | ICD-10-CM | POA: Diagnosis not present

## 2023-10-10 NOTE — Progress Notes (Addendum)
 Subjective:   Victoria Snyder is a 58 y.o. who presents for a Medicare Wellness preventive visit.  As a reminder, Annual Wellness Visits don't include a physical exam, and some assessments may be limited, especially if this visit is performed virtually. We may recommend an in-person follow-up visit with your provider if needed.  Visit Complete: In person    Persons Participating in Visit: Patient.  AWV Questionnaire: No: Patient Medicare AWV questionnaire was not completed prior to this visit.  Cardiac Risk Factors include: advanced age (>84men, >48 women);hypertension     Objective:     Today's Vitals   10/10/23 1415  BP: 120/60  Pulse: 87  Temp: 98.1 F (36.7 C)  TempSrc: Oral  SpO2: 97%  Weight: 161 lb (73 kg)  Height: 5\' 9"  (1.753 m)   Body mass index is 23.78 kg/m.     10/10/2023    3:40 PM 10/10/2023    2:22 PM 01/30/2022   10:25 AM 01/24/2022    8:34 AM 11/07/2021    3:19 PM 10/23/2021   12:02 PM 12/28/2018   11:55 AM  Advanced Directives  Does Patient Have a Medical Advance Directive? No No No No No No No  Would patient like information on creating a medical advance directive? No - Patient declined No - Patient declined No - Patient declined Yes (MAU/Ambulatory/Procedural Areas - Information given)  No - Patient declined Yes (MAU/Ambulatory/Procedural Areas - Information given)    Current Medications (verified) Outpatient Encounter Medications as of 10/10/2023  Medication Sig   amLODipine  (NORVASC ) 5 MG tablet Take 1 tablet (5 mg total) by mouth daily.   Ascorbic Acid (VITAMIN C) 1000 MG tablet Take 1,000 mg by mouth daily.   busPIRone  (BUSPAR ) 15 MG tablet Take 1 tablet (15 mg total) by mouth 3 (three) times daily.   gabapentin  (NEURONTIN ) 400 MG capsule Take 1 capsule (400 mg total) by mouth 3 (three) times daily. To help with back pain with radiation   hydrOXYzine  (ATARAX ) 25 MG tablet Take 1 tablet (25 mg total) by mouth 3 (three) times daily as needed for  anxiety.   Multiple Vitamins-Minerals (MULTIVITAMIN WITH MINERALS) tablet Take 1 tablet by mouth daily.   OVER THE COUNTER MEDICATION Sea moss   predniSONE  (STERAPRED UNI-PAK 21 TAB) 10 MG (21) TBPK tablet Take as directed for 6 days   QUEtiapine  (SEROQUEL ) 300 MG tablet Take 1 tablet (300 mg total) by mouth at bedtime.   traZODone  (DESYREL ) 150 MG tablet Take 1 tablet (150 mg total) by mouth at bedtime as needed for sleep.   venlafaxine  XR (EFFEXOR -XR) 75 MG 24 hr capsule Take 1 capsule (75 mg total) by mouth daily.   zinc  gluconate 50 MG tablet Take 1 tablet (50 mg total) by mouth daily.   No facility-administered encounter medications on file as of 10/10/2023.    Allergies (verified) Patient has no known allergies.   History: Past Medical History:  Diagnosis Date   Anxiety    Arthritis    Bipolar disorder (HCC)    Blood transfusion without reported diagnosis    Hypertension    Hypokalemia    Low back pain with radiation    Vertigo    Past Surgical History:  Procedure Laterality Date   ABDOMINAL HYSTERECTOMY     partial for fibroids   COLONOSCOPY  10/18/2022   EXPLORATORY LAPAROTOMY  2011   mass in abdomen removed-uterine leiomyomata   TOTAL HIP ARTHROPLASTY Right 01/30/2022   Procedure: RIGHT TOTAL HIP ARTHROPLASTY  ANTERIOR APPROACH;  Surgeon: Adah Acron, MD;  Location: [facility] OR;  Service: Orthopedics;  Laterality: Right;   Family History  Problem Relation Age of Onset   Heart disease Mother        heart attack   Stroke Father    Breast cancer Neg Hx    Social History   Socioeconomic History   Marital status: Single    Spouse name: Not on file   Number of children: 1   Years of education: Not on file   Highest education level: 12th grade  Occupational History   Occupation: Unemployed  Tobacco Use   Smoking status: Former    Current packs/day: 0.00    Types: Cigarettes    Quit date: 2001    Years since quitting: 24.3   Smokeless tobacco: Never  Vaping Use    Vaping status: Never Used  Substance and Sexual Activity   Alcohol use: Yes    Comment: Drinks occasionally   Drug use: No   Sexual activity: Yes    Birth control/protection: Surgical, Post-menopausal  Other Topics Concern   Not on file  Social History Narrative   Separated.  1 child   Not working.    Social Drivers of Corporate investment banker Strain: Low Risk  (10/10/2023)   Overall Financial Resource Strain (CARDIA)    Difficulty of Paying Living Expenses: Not hard at all  Food Insecurity: No Food Insecurity (10/10/2023)   Hunger Vital Sign    Worried About Running Out of Food in the Last Year: Never true    Ran Out of Food in the Last Year: Never true  Transportation Needs: No Transportation Needs (10/10/2023)   PRAPARE - Administrator, Civil Service (Medical): No    Lack of Transportation (Non-Medical): No  Physical Activity: Insufficiently Active (10/10/2023)   Exercise Vital Sign    Days of Exercise per Week: 2 days    Minutes of Exercise per Session: 60 min  Stress: No Stress Concern Present (10/10/2023)   [employer] of Occupational Health - Occupational Stress Questionnaire    Feeling of Stress : Not at all  Social Connections: Moderately Integrated (10/10/2023)   Social Connection and Isolation Panel [NHANES]    Frequency of Communication with Friends and Family: More than three times a week    Frequency of Social Gatherings with Friends and Family: More than three times a week    Attends Religious Services: More than 4 times per year    Active Member of [employer] or Organizations: Yes    Attends Engineer, structural: More than 4 times per year    Marital Status: Divorced  Recent Concern: Social Connections - Socially Isolated (10/10/2023)   Social Connection and Isolation Panel [NHANES]    Frequency of Communication with Friends and Family: More than three times a week    Frequency of Social Gatherings with Friends and Family: More than  three times a week    Attends Religious Services: Never    Database administrator or Organizations: No    Attends Engineer, structural: Never    Marital Status: Never married    Tobacco Counseling Counseling given: Not Answered    Clinical Intake:  Pre-visit preparation completed: Yes  Pain : No/denies pain     BMI - recorded: 31.44 Nutritional Status: BMI > 30  Obese Nutritional Risks: None Diabetes: No  Lab Results  Component Value Date   HGBA1C 5.2 06/06/2021   HGBA1C  4.9 06/09/2019   HGBA1C 5.2 10/22/2017     How often do you need to have someone help you when you read instructions, pamphlets, or other written materials from your doctor or pharmacy?: 3 - Sometimes (Friend assiat)  Interpreter Needed?: No  Information entered by :: Farris Hong LPN   Activities of Daily Living     10/10/2023    2:20 PM  In your present state of health, do you have any difficulty performing the following activities:  Hearing? 0  Vision? 0  Difficulty concentrating or making decisions? 0  Walking or climbing stairs? 0  Dressing or bathing? 0  Doing errands, shopping? 0  Preparing Food and eating ? N  Using the Toilet? N  In the past six months, have you accidently leaked urine? N  Do you have problems with loss of bowel control? N  Managing your Medications? N  Managing your Finances? N  Housekeeping or managing your Housekeeping? N    Patient Care Team: Aida House, MD as PCP - General (Family Medicine)  Indicate any recent Medical Services you may have received from other than [facility] providers in the past year (date may be approximate).     Assessment:    This is a routine wellness examination for Victoria Snyder.  Hearing/Vision screen Hearing Screening - Comments:: Denies hearing difficulties   Vision Screening - Comments:: Wears rx glasses - up to date with routine eye exams with  [facility]   Goals Addressed               This Visit's  Progress     Increase physical activity (pt-stated)        Remain active       Depression Screen     10/10/2023    2:20 PM 09/03/2023    1:51 PM 06/19/2023    2:45 PM 03/19/2023    8:31 AM 12/18/2022    8:40 AM 12/04/2022   10:59 AM 09/25/2022    8:40 AM  PHQ 2/9 Scores  PHQ - 2 Score 0    3    PHQ- 9 Score     6       Information is confidential and restricted. Go to Review Flowsheets to unlock data.    Fall Risk     10/10/2023    2:21 PM 12/18/2021   11:33 AM 07/13/2021    4:14 PM 05/17/2021    9:38 AM 10/13/2020    9:52 AM  Fall Risk   Falls in the past year? 0 0 0 1 0  Number falls in past yr: 0 0  0 0  Injury with Fall? 0 0  0 0  Risk for fall due to : No Fall Risks No Fall Risks No Fall Risks No Fall Risks No Fall Risks  Follow up Falls prevention discussed;Falls evaluation completed Falls prevention discussed       MEDICARE RISK AT HOME:  Medicare Risk at Home Any stairs in or around the home?: No If so, are there any without handrails?: No Home free of loose throw rugs in walkways, pet beds, electrical cords, etc?: Yes Adequate lighting in your home to reduce risk of falls?: Yes Life alert?: No Use of a cane, walker or w/c?: No Grab bars in the bathroom?: No Shower chair or bench in shower?: No Elevated toilet seat or a handicapped toilet?: No  TIMED UP AND GO:  Was the test performed?  Yes  Length of time to ambulate 10 feet:  10 sec Gait steady fast without the use of device  Cognitive Function: 6CIT completed        10/10/2023    3:04 PM 10/10/2023    2:22 PM  6CIT Screen  What Year? 0 points 0 points  What month? 0 points 0 points  What time? 0 points 0 points  Count back from 20 0 points 0 points  Months in reverse 0 points 0 points  Repeat phrase 0 points 0 points  Total Score 0 points 0 points    Immunizations Immunization History  Administered Date(s) Administered   Influenza,inj,Quad PF,6+ Mos 06/04/2021   Moderna Sars-Covid-2  Vaccination 05/24/2020, 06/21/2020   Tdap 08/18/2020   Zoster Recombinant(Shingrix ) 03/26/2022, 06/19/2022    Screening Tests Health Maintenance  Topic Date Due   COVID-19 Vaccine (3 - Moderna risk series) 07/19/2020   INFLUENZA VACCINE  12/26/2023   MAMMOGRAM  08/29/2024   Medicare Annual Wellness (AWV)  10/09/2024   DTaP/Tdap/Td (2 - Td or Tdap) 08/19/2030   Colonoscopy  10/17/2032   Hepatitis C Screening  Completed   HIV Screening  Completed   Zoster Vaccines- Shingrix   Completed   HPV VACCINES  Aged Out   Meningococcal B Vaccine  Aged Out    Health Maintenance  Health Maintenance Due  Topic Date Due   COVID-19 Vaccine (3 - Moderna risk series) 07/19/2020   Health Maintenance Items Addressed:   Additional Screening:  Vision Screening: Recommended annual ophthalmology exams for early detection of glaucoma and other disorders of the eye.  Dental Screening: Recommended annual dental exams for proper oral hygiene  Community Resource Referral / Chronic Care Management: CRR required this visit?  No   CCM required this visit?  No   Plan:    I have personally reviewed and noted the following in the patient's chart:   Medical and social history Use of alcohol, tobacco or illicit drugs  Current medications and supplements including opioid prescriptions. Patient is not currently taking opioid prescriptions. Functional ability and status Nutritional status Physical activity Advanced directives List of other physicians Hospitalizations, surgeries, and ER visits in previous 12 months Vitals Screenings to include cognitive, depression, and falls Referrals and appointments  In addition, I have reviewed and discussed with patient certain preventive protocols, quality metrics, and best practice recommendations. A written personalized care plan for preventive services as well as general preventive health recommendations were provided to patient.   Dewayne Ford,  LPN   [id number]   After Visit Summary: (In Person-Printed) AVS printed and given to the patient  Notes: Nothing significant to report at this time.

## 2023-10-10 NOTE — Patient Instructions (Addendum)
 Victoria Snyder , Thank you for taking time out of your busy schedule to complete your Annual Wellness Visit with me. I enjoyed our conversation and look forward to speaking with you again next year. I, as well as your care team,  appreciate your ongoing commitment to your health goals. Please review the following plan we discussed and let me know if I can assist you in the future. Your Game plan/ To Do List    Referrals: If you haven't heard from the office you've been referred to, please reach out to them at the phone provided.   Follow up Visits: Next Medicare AWV with our clinical staff: 10/15/24 @ 2:20p   Have you seen your provider in the last 6 months (3 months if uncontrolled diabetes)? Yes Next Office Visit with your provider:   Clinician Recommendations:  Aim for 30 minutes of exercise or brisk walking, 6-8 glasses of water, and 5 servings of fruits and vegetables each day.       This is a list of the screening recommended for you and due dates:  Health Maintenance  Topic Date Due   COVID-19 Vaccine (3 - Moderna risk series) 07/19/2020   Flu Shot  12/26/2023   Mammogram  08/29/2024   Medicare Annual Wellness Visit  10/09/2024   DTaP/Tdap/Td vaccine (2 - Td or Tdap) 08/19/2030   Colon Cancer Screening  10/17/2032   Hepatitis C Screening  Completed   HIV Screening  Completed   Zoster (Shingles) Vaccine  Completed   HPV Vaccine  Aged Out   Meningitis B Vaccine  Aged Out    Advanced directives: (Copy Requested) Please bring a copy of your health care power of attorney and living will to the office to be added to your chart at your convenience. You can mail to [facility] [address] [place], [place] [address] or email to [email or web address] Advance Care Planning is important because it:  [x]  Makes sure you receive the medical care that is consistent with your values, goals, and preferences  [x]  It provides guidance to your family and loved ones and  reduces their decisional burden about whether or not they are making the right decisions based on your wishes.  Follow the link provided in your after visit summary or read over the paperwork we have mailed to you to help you started getting your Advance Directives in place. If you need assistance in completing these, please reach out to us  so that we can help you!  See attachments for Preventive Care and Fall Prevention Tips.

## 2023-10-15 ENCOUNTER — Other Ambulatory Visit: Payer: Self-pay

## 2023-10-17 ENCOUNTER — Encounter: Payer: Self-pay | Admitting: Family Medicine

## 2023-10-17 ENCOUNTER — Other Ambulatory Visit: Payer: Self-pay

## 2023-10-17 ENCOUNTER — Ambulatory Visit (INDEPENDENT_AMBULATORY_CARE_PROVIDER_SITE_OTHER): Admitting: Family Medicine

## 2023-10-17 VITALS — BP 120/80 | HR 85 | Temp 98.3°F | Ht 69.0 in | Wt 154.8 lb

## 2023-10-17 DIAGNOSIS — Z0000 Encounter for general adult medical examination without abnormal findings: Secondary | ICD-10-CM | POA: Diagnosis not present

## 2023-10-17 DIAGNOSIS — Z13220 Encounter for screening for lipoid disorders: Secondary | ICD-10-CM | POA: Diagnosis not present

## 2023-10-17 DIAGNOSIS — I10 Essential (primary) hypertension: Secondary | ICD-10-CM | POA: Diagnosis not present

## 2023-10-17 DIAGNOSIS — Z1231 Encounter for screening mammogram for malignant neoplasm of breast: Secondary | ICD-10-CM | POA: Diagnosis not present

## 2023-10-17 MED ORDER — AMLODIPINE BESYLATE 5 MG PO TABS
5.0000 mg | ORAL_TABLET | Freq: Every day | ORAL | 3 refills | Status: AC
  Filled 2023-10-17 – 2023-11-19 (×2): qty 90, 90d supply, fill #0
  Filled 2024-02-12 – 2024-03-01 (×2): qty 90, 90d supply, fill #1
  Filled 2024-06-18: qty 90, 90d supply, fill #2

## 2023-10-17 NOTE — Progress Notes (Unsigned)
 Complete physical exam  Patient: Victoria Snyder   DOB: 06-17-1965   58 y.o. Female  MRN: [id number]  Subjective:     Chief Complaint  Patient presents with   Hot flashes x5 years   Sexual Problem    Victoria Snyder is a 58 y.o. female who presents today for a complete physical exam. She reports consuming a general diet. Home exercise routine includes walking 2-3 hrs per week. She generally feels well. She reports sleeping fairly well. She does not have additional problems to discuss today.   Takes vitamins regularly  Medical history was updated, pt had a hip replacement since the last visit.  Most recent fall risk assessment:    10/10/2023    2:21 PM  Fall Risk   Falls in the past year? 0  Number falls in past yr: 0  Injury with Fall? 0  Risk for fall due to : No Fall Risks  Follow up Falls prevention discussed;Falls evaluation completed     Most recent depression screenings:    10/10/2023    2:20 PM 09/03/2023    1:51 PM  PHQ 2/9 Scores  PHQ - 2 Score 0   PHQ- 9 Score       Information is confidential and restricted. Go to Review Flowsheets to unlock data.    Vision:Within last year and Dental: No current dental problems and Receives regular dental care  Patient Active Problem List   Diagnosis Date Noted   Status post total hip replacement, right 01/30/2022   Cervicalgia 07/13/2021   DDD (degenerative disc disease), lumbar 12/17/2018   Spinal stenosis of lumbar region 12/17/2018   Essential hypertension 03/31/2018   Low back pain with radiation 03/31/2018   History of vertigo 03/31/2018   Anxiety and depression 03/31/2018   Bipolar disorder (HCC) 03/31/2018      Patient Care Team: Aida House, MD as PCP - General (Family Medicine)   Outpatient Medications Prior to Visit  Medication Sig   Ascorbic Acid (VITAMIN C) 1000 MG tablet Take 1,000 mg by mouth daily.   busPIRone  (BUSPAR ) 15 MG tablet Take 1 tablet (15 mg total) by mouth 3 (three) times  daily.   gabapentin  (NEURONTIN ) 400 MG capsule Take 1 capsule (400 mg total) by mouth 3 (three) times daily. To help with back pain with radiation   hydrOXYzine  (ATARAX ) 25 MG tablet Take 1 tablet (25 mg total) by mouth 3 (three) times daily as needed for anxiety.   Multiple Vitamins-Minerals (MULTIVITAMIN WITH MINERALS) tablet Take 1 tablet by mouth daily.   OVER THE COUNTER MEDICATION Sea moss   QUEtiapine  (SEROQUEL ) 300 MG tablet Take 1 tablet (300 mg total) by mouth at bedtime.   traZODone  (DESYREL ) 150 MG tablet Take 1 tablet (150 mg total) by mouth at bedtime as needed for sleep.   venlafaxine  XR (EFFEXOR -XR) 75 MG 24 hr capsule Take 1 capsule (75 mg total) by mouth daily.   zinc  gluconate 50 MG tablet Take 1 tablet (50 mg total) by mouth daily.   [DISCONTINUED] amLODipine  (NORVASC ) 5 MG tablet Take 1 tablet (5 mg total) by mouth daily.   [DISCONTINUED] predniSONE  (STERAPRED UNI-PAK 21 TAB) 10 MG (21) TBPK tablet Take as directed for 6 days   No facility-administered medications prior to visit.    Review of Systems  HENT:  Negative for hearing loss.   Eyes:  Negative for blurred vision.  Respiratory:  Negative for shortness of breath.   Cardiovascular:  Negative for chest  pain.  Gastrointestinal: Negative.   Genitourinary: Negative.   Musculoskeletal:  Negative for back pain.  Neurological:  Negative for headaches.  Psychiatric/Behavioral:  Negative for depression.        Objective:     BP 120/80   Pulse 85   Temp 98.3 F (36.8 C) (Oral)   Ht 5\' 9"  (1.753 m)   Wt 154 lb 12.8 oz (70.2 kg)   SpO2 99%   BMI 22.86 kg/m  {Vitals History (Optional):23777}  Physical Exam Vitals reviewed.  Constitutional:      Appearance: Normal appearance. She is well-groomed and normal weight.  HENT:     Right Ear: Tympanic membrane and ear canal normal.     Left Ear: Tympanic membrane and ear canal normal.     Mouth/Throat:     Mouth: Mucous membranes are moist.     Pharynx: No  posterior oropharyngeal erythema.  Eyes:     Conjunctiva/sclera: Conjunctivae normal.  Neck:     Thyroid: No thyromegaly.  Cardiovascular:     Rate and Rhythm: Normal rate and regular rhythm.     Pulses: Normal pulses.     Heart sounds: S1 normal and S2 normal.  Pulmonary:     Effort: Pulmonary effort is normal.     Breath sounds: Normal breath sounds and air entry.  Abdominal:     General: Abdomen is flat. Bowel sounds are normal.     Palpations: Abdomen is soft.  Musculoskeletal:     Right lower leg: No edema.     Left lower leg: No edema.  Lymphadenopathy:     Cervical: No cervical adenopathy.  Neurological:     Mental Status: She is alert and oriented to person, place, and time. Mental status is at baseline.     Gait: Gait is intact.  Psychiatric:        Mood and Affect: Mood and affect normal.        Speech: Speech normal.        Behavior: Behavior normal.        Judgment: Judgment normal.      No results found for any visits on 10/17/23. {Show previous labs (optional):23779}    Assessment & Plan:    Routine Health Maintenance and Physical Exam  Immunization History  Administered Date(s) Administered   Influenza,inj,Quad PF,6+ Mos 06/04/2021   Moderna Sars-Covid-2 Vaccination 05/24/2020, 06/21/2020   Tdap 08/18/2020   Zoster Recombinant(Shingrix ) 03/26/2022, 06/19/2022    Health Maintenance  Topic Date Due   COVID-19 Vaccine (3 - Moderna risk series) 07/19/2020   INFLUENZA VACCINE  12/26/2023   MAMMOGRAM  08/29/2024   Medicare Annual Wellness (AWV)  10/09/2024   DTaP/Tdap/Td (2 - Td or Tdap) 08/19/2030   Colonoscopy  10/17/2032   Hepatitis C Screening  Completed   HIV Screening  Completed   Zoster Vaccines- Shingrix   Completed   HPV VACCINES  Aged Out   Meningococcal B Vaccine  Aged Out    Discussed health benefits of physical activity, and encouraged her to engage in regular exercise appropriate for her age and condition.  Essential hypertension -      amLODIPine  Besylate; Take 1 tablet (5 mg total) by mouth daily.  Dispense: 90 tablet; Refill: 3 -     Comprehensive metabolic panel with GFR; Future  Lipid screening -     Lipid panel; Future  Breast cancer screening by mammogram -     3D Screening Mammogram, Left and Right; Future    Return in about  1 year (around 10/16/2024) for annual physical exam.     Aida House, MD

## 2023-10-18 LAB — LIPID PANEL
Chol/HDL Ratio: 2.1 ratio (ref 0.0–4.4)
Cholesterol, Total: 192 mg/dL (ref 100–199)
HDL: 90 mg/dL (ref >39)
LDL Chol Calc (NIH): 76 mg/dL (ref 0–99)
Triglycerides: 161 mg/dL — ABNORMAL HIGH (ref 0–149)
VLDL Cholesterol Cal: 26 mg/dL (ref 5–40)

## 2023-10-18 LAB — COMPREHENSIVE METABOLIC PANEL WITH GFR
ALT: 13 IU/L (ref 0–32)
AST: 16 IU/L (ref 0–40)
Albumin: 4.7 g/dL (ref 3.8–4.9)
Alkaline Phosphatase: 110 IU/L (ref 44–121)
BUN/Creatinine Ratio: 14 (ref 9–23)
BUN: 13 mg/dL (ref 6–24)
Bilirubin Total: 0.6 mg/dL (ref 0.0–1.2)
CO2: 20 mmol/L (ref 20–29)
Calcium: 10 mg/dL (ref 8.7–10.2)
Chloride: 104 mmol/L (ref 96–106)
Creatinine, Ser: 0.9 mg/dL (ref 0.57–1.00)
Globulin, Total: 2.2 g/dL (ref 1.5–4.5)
Glucose: 94 mg/dL (ref 70–99)
Potassium: 4.4 mmol/L (ref 3.5–5.2)
Sodium: 144 mmol/L (ref 134–144)
Total Protein: 6.9 g/dL (ref 6.0–8.5)
eGFR: 74 mL/min/{1.73_m2} (ref >59)

## 2023-10-21 ENCOUNTER — Other Ambulatory Visit: Payer: Self-pay | Admitting: Family Medicine

## 2023-10-21 DIAGNOSIS — Z1231 Encounter for screening mammogram for malignant neoplasm of breast: Secondary | ICD-10-CM

## 2023-10-24 ENCOUNTER — Ambulatory Visit: Payer: Self-pay | Admitting: Family Medicine

## 2023-10-27 ENCOUNTER — Ambulatory Visit

## 2023-10-30 ENCOUNTER — Encounter (HOSPITAL_COMMUNITY): Payer: Self-pay | Admitting: Psychiatry

## 2023-10-30 ENCOUNTER — Telehealth (HOSPITAL_COMMUNITY): Admitting: Psychiatry

## 2023-10-30 ENCOUNTER — Other Ambulatory Visit: Payer: Self-pay

## 2023-10-30 DIAGNOSIS — F3131 Bipolar disorder, current episode depressed, mild: Secondary | ICD-10-CM

## 2023-10-30 DIAGNOSIS — F411 Generalized anxiety disorder: Secondary | ICD-10-CM

## 2023-10-30 MED ORDER — BUSPIRONE HCL 15 MG PO TABS
15.0000 mg | ORAL_TABLET | Freq: Three times a day (TID) | ORAL | 3 refills | Status: AC
  Filled 2023-10-30 – 2023-11-19 (×2): qty 90, 30d supply, fill #0
  Filled 2024-02-04: qty 90, 30d supply, fill #1
  Filled 2024-06-18: qty 90, 30d supply, fill #2

## 2023-10-30 MED ORDER — QUETIAPINE FUMARATE 300 MG PO TABS
300.0000 mg | ORAL_TABLET | Freq: Every day | ORAL | 3 refills | Status: AC
  Filled 2023-10-30 – 2023-11-19 (×2): qty 30, 30d supply, fill #0
  Filled 2024-02-04: qty 30, 30d supply, fill #1
  Filled 2024-06-18: qty 30, 30d supply, fill #2

## 2023-10-30 MED ORDER — VENLAFAXINE HCL ER 75 MG PO CP24
75.0000 mg | ORAL_CAPSULE | Freq: Every day | ORAL | 3 refills | Status: AC
  Filled 2023-10-30 – 2023-11-19 (×2): qty 30, 30d supply, fill #0
  Filled 2024-02-04: qty 30, 30d supply, fill #1
  Filled 2024-06-18: qty 30, 30d supply, fill #2

## 2023-10-30 MED ORDER — TRAZODONE HCL 150 MG PO TABS
150.0000 mg | ORAL_TABLET | Freq: Every evening | ORAL | 3 refills | Status: AC | PRN
  Filled 2023-10-30 – 2024-02-04 (×2): qty 30, 30d supply, fill #0
  Filled 2024-06-18: qty 30, 30d supply, fill #1

## 2023-10-30 MED ORDER — HYDROXYZINE HCL 25 MG PO TABS
25.0000 mg | ORAL_TABLET | Freq: Three times a day (TID) | ORAL | 3 refills | Status: AC | PRN
  Filled 2023-10-30 – 2024-02-04 (×2): qty 90, 30d supply, fill #0
  Filled 2024-06-18: qty 90, 30d supply, fill #1

## 2023-10-30 MED ORDER — GABAPENTIN 400 MG PO CAPS
400.0000 mg | ORAL_CAPSULE | Freq: Three times a day (TID) | ORAL | 3 refills | Status: AC
  Filled 2023-10-30 – 2023-11-19 (×2): qty 90, 30d supply, fill #0
  Filled 2024-02-04: qty 90, 30d supply, fill #1
  Filled 2024-06-18: qty 90, 30d supply, fill #2
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
## 2023-11-03 ENCOUNTER — Ambulatory Visit
Admission: RE | Admit: 2023-11-03 | Discharge: 2023-11-03 | Disposition: A | Discharge status: Home / Self Care | Source: Ambulatory Visit | Attending: Family Medicine | Admitting: Family Medicine

## 2023-11-03 DIAGNOSIS — Z1231 Encounter for screening mammogram for malignant neoplasm of breast: Secondary | ICD-10-CM | POA: Diagnosis not present

## 2023-11-07 ENCOUNTER — Ambulatory Visit: Payer: Self-pay | Admitting: Family Medicine

## 2023-11-10 ENCOUNTER — Other Ambulatory Visit: Payer: Self-pay

## 2023-11-19 ENCOUNTER — Other Ambulatory Visit: Payer: Self-pay

## 2024-01-15 ENCOUNTER — Telehealth (HOSPITAL_COMMUNITY): Admitting: Family

## 2024-01-28 ENCOUNTER — Telehealth (HOSPITAL_COMMUNITY): Admitting: Psychiatry

## 2024-02-01 DIAGNOSIS — H2513 Age-related nuclear cataract, bilateral: Secondary | ICD-10-CM | POA: Diagnosis not present

## 2024-02-01 DIAGNOSIS — H00011 Hordeolum externum right upper eyelid: Secondary | ICD-10-CM | POA: Diagnosis not present

## 2024-02-04 ENCOUNTER — Other Ambulatory Visit: Payer: Self-pay

## 2024-02-04 MED ORDER — DOXYCYCLINE MONOHYDRATE 100 MG PO CAPS
ORAL_CAPSULE | ORAL | 0 refills | Status: AC
  Filled 2024-02-04: qty 17, 12d supply, fill #0

## 2024-02-05 ENCOUNTER — Other Ambulatory Visit: Payer: Self-pay

## 2024-02-23 ENCOUNTER — Other Ambulatory Visit: Payer: Self-pay

## 2024-03-01 ENCOUNTER — Other Ambulatory Visit: Payer: Self-pay

## 2024-03-29 ENCOUNTER — Encounter: Payer: Self-pay | Admitting: Radiology

## 2024-06-18 ENCOUNTER — Other Ambulatory Visit: Payer: Self-pay

## 2024-10-15 ENCOUNTER — Ambulatory Visit
